# Patient Record
Sex: Female | Born: 1945 | Race: Black or African American | Hispanic: No | Smoking: Former smoker
Health system: Southern US, Community
[De-identification: ages and names within clinical notes are randomized; demographics above are authoritative.]

## PROBLEM LIST (undated history)

## (undated) DIAGNOSIS — Z789 Other specified health status: Secondary | ICD-10-CM

## (undated) DIAGNOSIS — D649 Anemia, unspecified: Secondary | ICD-10-CM

## (undated) DIAGNOSIS — B1920 Unspecified viral hepatitis C without hepatic coma: Secondary | ICD-10-CM

## (undated) DIAGNOSIS — B20 Human immunodeficiency virus [HIV] disease: Secondary | ICD-10-CM

## (undated) DIAGNOSIS — K4090 Unilateral inguinal hernia, without obstruction or gangrene, not specified as recurrent: Secondary | ICD-10-CM

## (undated) DIAGNOSIS — Z21 Asymptomatic human immunodeficiency virus [HIV] infection status: Secondary | ICD-10-CM

## (undated) DIAGNOSIS — K219 Gastro-esophageal reflux disease without esophagitis: Secondary | ICD-10-CM

## (undated) DIAGNOSIS — K648 Other hemorrhoids: Secondary | ICD-10-CM

## (undated) DIAGNOSIS — I10 Essential (primary) hypertension: Secondary | ICD-10-CM

## (undated) HISTORY — DX: Anemia, unspecified: D64.9

## (undated) HISTORY — DX: Asymptomatic human immunodeficiency virus (hiv) infection status: Z21

## (undated) HISTORY — DX: Essential (primary) hypertension: I10

## (undated) HISTORY — DX: Human immunodeficiency virus (HIV) disease: B20

---

## 2003-08-11 ENCOUNTER — Encounter: Payer: Self-pay | Admitting: Emergency Medicine

## 2003-08-11 ENCOUNTER — Emergency Department (HOSPITAL_COMMUNITY): Admission: EM | Admit: 2003-08-11 | Discharge: 2003-08-11 | Payer: Self-pay | Admitting: Emergency Medicine

## 2008-04-26 ENCOUNTER — Ambulatory Visit: Payer: Self-pay | Admitting: Internal Medicine

## 2008-04-27 ENCOUNTER — Encounter: Payer: Self-pay | Admitting: Internal Medicine

## 2008-04-27 ENCOUNTER — Ambulatory Visit (HOSPITAL_COMMUNITY): Admission: RE | Admit: 2008-04-27 | Discharge: 2008-04-27 | Payer: Self-pay | Admitting: Internal Medicine

## 2008-04-27 LAB — CONVERTED CEMR LAB
ALT: 34 units/L (ref 0–35)
AST: 32 units/L (ref 0–37)
Absolute CD4: 874 #/uL (ref 381–1469)
Albumin: 4 g/dL (ref 3.5–5.2)
Alkaline Phosphatase: 43 units/L (ref 39–117)
BUN: 12 mg/dL (ref 6–23)
Basophils Absolute: 0 10*3/uL (ref 0.0–0.1)
Basophils Relative: 0 % (ref 0–1)
CD4 T Helper %: 47 % (ref 32–62)
CO2: 17 meq/L — ABNORMAL LOW (ref 19–32)
Calcium: 9.3 mg/dL (ref 8.4–10.5)
Chloride: 109 meq/L (ref 96–112)
Creatinine, Ser: 0.91 mg/dL (ref 0.40–1.20)
Eosinophils Absolute: 0 10*3/uL (ref 0.0–0.7)
Eosinophils Relative: 1 % (ref 0–5)
Glucose, Bld: 95 mg/dL (ref 70–99)
HCT: 37.6 % (ref 36.0–46.0)
HCV Ab: POSITIVE — AB
HIV 1 RNA Quant: <50 copies/mL (ref <50)
HIV-1 RNA Quant, Log: <1.7 (ref <1.70)
HIV-1 antibody: POSITIVE — AB
HIV-2 Ab: UNDETERMINED — AB
HIV: REACTIVE
Hemoglobin: 12.1 g/dL (ref 12.0–15.0)
Hep B S Ab: NEGATIVE
Hepatitis B Surface Ag: NEGATIVE
Lymphocytes Relative: 62 % — ABNORMAL HIGH (ref 12–46)
Lymphs Abs: 1.8 10*3/uL (ref 0.7–4.0)
MCHC: 32.2 g/dL (ref 30.0–36.0)
MCV: 88.3 fL (ref 78.0–100.0)
Monocytes Absolute: 0.3 10*3/uL (ref 0.1–1.0)
Monocytes Relative: 10 % (ref 3–12)
Neutro Abs: 0.8 10*3/uL — ABNORMAL LOW (ref 1.7–7.7)
Neutrophils Relative %: 27 % — ABNORMAL LOW (ref 43–77)
Platelets: 132 10*3/uL — ABNORMAL LOW (ref 150–400)
Potassium: 4 meq/L (ref 3.5–5.3)
RBC: 4.26 M/uL (ref 3.87–5.11)
RDW: 13.4 % (ref 11.5–15.5)
Sodium: 141 meq/L (ref 135–145)
Total Bilirubin: 0.7 mg/dL (ref 0.3–1.2)
Total Lymphocytes %: 62 % — ABNORMAL HIGH (ref 12–46)
Total Protein: 7.8 g/dL (ref 6.0–8.3)
Total lymphocyte count: 1860 cells/mcL (ref 700–3300)
WBC, lymph enumeration: 3 10*3/uL — ABNORMAL LOW (ref 4.0–10.5)
WBC: 3 10*3/uL — ABNORMAL LOW (ref 4.0–10.5)

## 2008-05-10 ENCOUNTER — Ambulatory Visit: Payer: Self-pay | Admitting: Internal Medicine

## 2008-05-14 ENCOUNTER — Encounter: Admission: RE | Admit: 2008-05-14 | Discharge: 2008-05-14 | Payer: Self-pay | Admitting: Family Medicine

## 2008-06-09 ENCOUNTER — Ambulatory Visit: Payer: Self-pay | Admitting: Internal Medicine

## 2008-07-10 ENCOUNTER — Encounter: Payer: Self-pay | Admitting: Internal Medicine

## 2008-07-10 LAB — CONVERTED CEMR LAB: Pap Smear: NEGATIVE

## 2008-07-16 ENCOUNTER — Encounter (INDEPENDENT_AMBULATORY_CARE_PROVIDER_SITE_OTHER): Payer: Self-pay | Admitting: Radiology

## 2008-07-16 ENCOUNTER — Encounter: Admission: RE | Admit: 2008-07-16 | Discharge: 2008-07-16 | Payer: Self-pay | Admitting: Internal Medicine

## 2008-07-26 HISTORY — PX: BREAST BIOPSY: SHX20

## 2008-08-12 ENCOUNTER — Encounter: Payer: Self-pay | Admitting: Internal Medicine

## 2008-08-12 ENCOUNTER — Ambulatory Visit: Payer: Self-pay | Admitting: Internal Medicine

## 2008-08-12 LAB — CONVERTED CEMR LAB
ALT: 34 units/L (ref 0–35)
AST: 33 units/L (ref 0–37)
Absolute CD4: 998 #/uL (ref 381–1469)
Albumin: 3.9 g/dL (ref 3.5–5.2)
Alkaline Phosphatase: 51 units/L (ref 39–117)
BUN: 14 mg/dL (ref 6–23)
Basophils Absolute: 0 10*3/uL (ref 0.0–0.1)
Basophils Relative: 0 % (ref 0–1)
CD4 T Helper %: 47 % (ref 32–62)
CO2: 22 meq/L (ref 19–32)
Calcium: 9.4 mg/dL (ref 8.4–10.5)
Chloride: 106 meq/L (ref 96–112)
Cholesterol: 147 mg/dL (ref 0–200)
Creatinine, Ser: 0.86 mg/dL (ref 0.40–1.20)
Eosinophils Absolute: 0 10*3/uL (ref 0.0–0.7)
Eosinophils Relative: 1 % (ref 0–5)
Glucose, Bld: 99 mg/dL (ref 70–99)
HCT: 35.2 % — ABNORMAL LOW (ref 36.0–46.0)
HCV Quantitative: 8540000 intl units/mL — ABNORMAL HIGH (ref <43)
HDL: 47 mg/dL (ref >39)
HIV 1 RNA Quant: 110 copies/mL — ABNORMAL HIGH (ref <50)
HIV-1 RNA Quant, Log: 2.04 — ABNORMAL HIGH (ref <1.70)
Hemoglobin: 11.6 g/dL — ABNORMAL LOW (ref 12.0–15.0)
Hep A Total Ab: NEGATIVE
LDL Cholesterol: 80 mg/dL (ref 0–99)
Lymphocytes Relative: 59 % — ABNORMAL HIGH (ref 12–46)
Lymphs Abs: 2.2 10*3/uL (ref 0.7–4.0)
MCHC: 33 g/dL (ref 30.0–36.0)
MCV: 84.2 fL (ref 78.0–100.0)
Monocytes Absolute: 0.4 10*3/uL (ref 0.1–1.0)
Monocytes Relative: 11 % (ref 3–12)
Neutro Abs: 1 10*3/uL — ABNORMAL LOW (ref 1.7–7.7)
Neutrophils Relative %: 29 % — ABNORMAL LOW (ref 43–77)
Platelets: 133 10*3/uL — ABNORMAL LOW (ref 150–400)
Potassium: 3.8 meq/L (ref 3.5–5.3)
RBC: 4.18 M/uL (ref 3.87–5.11)
RDW: 13 % (ref 11.5–15.5)
Sodium: 136 meq/L (ref 135–145)
Total Bilirubin: 0.9 mg/dL (ref 0.3–1.2)
Total CHOL/HDL Ratio: 3.1
Total Lymphocytes %: 59 % — ABNORMAL HIGH (ref 12–46)
Total Protein: 7.8 g/dL (ref 6.0–8.3)
Total lymphocyte count: 2124 cells/mcL (ref 700–3300)
Triglycerides: 102 mg/dL (ref <150)
VLDL: 20 mg/dL (ref 0–40)
WBC, lymph enumeration: 3.6 10*3/uL — ABNORMAL LOW (ref 4.0–10.5)
WBC: 3.6 10*3/uL — ABNORMAL LOW (ref 4.0–10.5)

## 2008-08-26 ENCOUNTER — Ambulatory Visit: Payer: Self-pay | Admitting: Internal Medicine

## 2008-09-09 ENCOUNTER — Ambulatory Visit: Payer: Self-pay | Admitting: Internal Medicine

## 2008-10-11 ENCOUNTER — Ambulatory Visit: Payer: Self-pay | Admitting: Internal Medicine

## 2008-11-22 ENCOUNTER — Ambulatory Visit: Payer: Self-pay | Admitting: *Deleted

## 2009-02-10 ENCOUNTER — Encounter: Admission: RE | Admit: 2009-02-10 | Discharge: 2009-02-10 | Payer: Self-pay | Admitting: Family Medicine

## 2009-04-18 ENCOUNTER — Encounter: Payer: Self-pay | Admitting: Internal Medicine

## 2009-04-18 ENCOUNTER — Ambulatory Visit: Payer: Self-pay | Admitting: Internal Medicine

## 2009-04-18 DIAGNOSIS — K921 Melena: Secondary | ICD-10-CM | POA: Insufficient documentation

## 2009-04-18 DIAGNOSIS — Z8719 Personal history of other diseases of the digestive system: Secondary | ICD-10-CM | POA: Insufficient documentation

## 2009-04-18 DIAGNOSIS — N63 Unspecified lump in unspecified breast: Secondary | ICD-10-CM | POA: Insufficient documentation

## 2009-04-18 DIAGNOSIS — B182 Chronic viral hepatitis C: Secondary | ICD-10-CM | POA: Insufficient documentation

## 2009-04-18 DIAGNOSIS — B20 Human immunodeficiency virus [HIV] disease: Secondary | ICD-10-CM | POA: Insufficient documentation

## 2009-04-18 LAB — CONVERTED CEMR LAB
ALT: 35 units/L (ref 0–35)
AST: 36 units/L (ref 0–37)
Absolute CD4: 1038 #/uL (ref 381–1469)
Albumin: 3.9 g/dL (ref 3.5–5.2)
Alkaline Phosphatase: 54 units/L (ref 39–117)
BUN: 12 mg/dL (ref 6–23)
Basophils Absolute: 0 10*3/uL (ref 0.0–0.1)
Basophils Relative: 0 % (ref 0–1)
CD4 Count: 1038 microliters
CD4 T Helper %: 46 % (ref 32–62)
CO2: 21 meq/L (ref 19–32)
Calcium: 9.5 mg/dL (ref 8.4–10.5)
Chloride: 106 meq/L (ref 96–112)
Cholesterol: 140 mg/dL (ref 0–200)
Creatinine, Ser: 0.88 mg/dL (ref 0.40–1.20)
Eosinophils Absolute: 0 10*3/uL (ref 0.0–0.7)
Eosinophils Relative: 1 % (ref 0–5)
Glucose, Bld: 85 mg/dL (ref 70–99)
HCT: 37.2 % (ref 36.0–46.0)
HDL: 51 mg/dL (ref >39)
HIV 1 RNA Quant: <48 copies/mL (ref <48)
HIV-1 RNA Quant, Log: <1.68 (ref <1.68)
Hemoglobin: 12 g/dL (ref 12.0–15.0)
LDL Cholesterol: 61 mg/dL (ref 0–99)
Lymphocytes Relative: 61 % — ABNORMAL HIGH (ref 12–46)
Lymphs Abs: 2.2 10*3/uL (ref 0.7–4.0)
MCHC: 32.3 g/dL (ref 30.0–36.0)
MCV: 84.4 fL (ref 78.0–100.0)
Monocytes Absolute: 0.4 10*3/uL (ref 0.1–1.0)
Monocytes Relative: 11 % (ref 3–12)
Neutro Abs: 1 10*3/uL — ABNORMAL LOW (ref 1.7–7.7)
Neutrophils Relative %: 26 % — ABNORMAL LOW (ref 43–77)
Platelets: 118 10*3/uL — ABNORMAL LOW (ref 150–400)
Potassium: 4.1 meq/L (ref 3.5–5.3)
RBC: 4.41 M/uL (ref 3.87–5.11)
RDW: 12.8 % (ref 11.5–15.5)
Sodium: 139 meq/L (ref 135–145)
Total Bilirubin: 0.5 mg/dL (ref 0.3–1.2)
Total CHOL/HDL Ratio: 2.7
Total Lymphocytes %: 61 % — ABNORMAL HIGH (ref 12–46)
Total Protein: 7.8 g/dL (ref 6.0–8.3)
Total lymphocyte count: 2257 cells/mcL (ref 700–3300)
Triglycerides: 138 mg/dL (ref <150)
VLDL: 28 mg/dL (ref 0–40)
WBC, lymph enumeration: 3.7 10*3/uL — ABNORMAL LOW (ref 4.0–10.5)
WBC: 3.7 10*3/uL — ABNORMAL LOW (ref 4.0–10.5)

## 2009-04-20 ENCOUNTER — Ambulatory Visit (HOSPITAL_COMMUNITY): Admission: RE | Admit: 2009-04-20 | Discharge: 2009-04-20 | Payer: Self-pay | Admitting: Gastroenterology

## 2009-04-27 ENCOUNTER — Encounter: Payer: Self-pay | Admitting: Internal Medicine

## 2009-04-28 ENCOUNTER — Encounter: Admission: RE | Admit: 2009-04-28 | Discharge: 2009-04-28 | Payer: Self-pay | Admitting: Family Medicine

## 2009-05-03 ENCOUNTER — Encounter: Payer: Self-pay | Admitting: Internal Medicine

## 2009-05-03 ENCOUNTER — Ambulatory Visit: Payer: Self-pay | Admitting: Internal Medicine

## 2009-06-09 ENCOUNTER — Encounter: Payer: Self-pay | Admitting: Internal Medicine

## 2009-10-20 ENCOUNTER — Encounter: Payer: Self-pay | Admitting: Internal Medicine

## 2009-10-20 ENCOUNTER — Ambulatory Visit: Payer: Self-pay | Admitting: Internal Medicine

## 2009-10-20 DIAGNOSIS — Z91013 Allergy to seafood: Secondary | ICD-10-CM

## 2010-01-15 ENCOUNTER — Emergency Department (HOSPITAL_COMMUNITY): Admission: EM | Admit: 2010-01-15 | Discharge: 2010-01-16 | Payer: Self-pay | Admitting: Emergency Medicine

## 2010-05-02 ENCOUNTER — Ambulatory Visit (HOSPITAL_COMMUNITY): Admission: RE | Admit: 2010-05-02 | Discharge: 2010-05-02 | Payer: Self-pay | Admitting: Family Medicine

## 2010-05-02 ENCOUNTER — Encounter: Payer: Self-pay | Admitting: Internal Medicine

## 2010-06-02 ENCOUNTER — Telehealth: Payer: Self-pay | Admitting: Internal Medicine

## 2010-06-02 ENCOUNTER — Emergency Department (HOSPITAL_COMMUNITY): Admission: EM | Admit: 2010-06-02 | Discharge: 2010-06-02 | Payer: Self-pay | Admitting: Emergency Medicine

## 2010-06-05 ENCOUNTER — Ambulatory Visit: Payer: Self-pay | Admitting: Internal Medicine

## 2010-06-05 DIAGNOSIS — N390 Urinary tract infection, site not specified: Secondary | ICD-10-CM | POA: Insufficient documentation

## 2010-06-05 LAB — CONVERTED CEMR LAB
ALT: 34 units/L (ref 0–35)
AST: 40 units/L — ABNORMAL HIGH (ref 0–37)
Albumin: 3.7 g/dL (ref 3.5–5.2)
Alkaline Phosphatase: 46 units/L (ref 39–117)
BUN: 14 mg/dL (ref 6–23)
Basophils Absolute: 0 10*3/uL (ref 0.0–0.1)
Basophils Relative: 0 % (ref 0–1)
CO2: 18 meq/L — ABNORMAL LOW (ref 19–32)
Calcium: 9.5 mg/dL (ref 8.4–10.5)
Chloride: 106 meq/L (ref 96–112)
Cholesterol: 137 mg/dL (ref 0–200)
Creatinine, Ser: 1.04 mg/dL (ref 0.40–1.20)
Eosinophils Absolute: 0 10*3/uL (ref 0.0–0.7)
Eosinophils Relative: 1 % (ref 0–5)
Glucose, Bld: 120 mg/dL — ABNORMAL HIGH (ref 70–99)
HCT: 37.3 % (ref 36.0–46.0)
HDL: 32 mg/dL — ABNORMAL LOW (ref >39)
HIV 1 RNA Quant: 88 copies/mL — ABNORMAL HIGH (ref <48)
HIV-1 RNA Quant, Log: 1.94 — ABNORMAL HIGH (ref <1.68)
Hemoglobin: 11.9 g/dL — ABNORMAL LOW (ref 12.0–15.0)
LDL Cholesterol: 80 mg/dL (ref 0–99)
Lymphocytes Relative: 34 % (ref 12–46)
Lymphs Abs: 1.5 10*3/uL (ref 0.7–4.0)
MCHC: 31.9 g/dL (ref 30.0–36.0)
MCV: 84.8 fL (ref 78.0–100.0)
Monocytes Absolute: 0.7 10*3/uL (ref 0.1–1.0)
Monocytes Relative: 15 % — ABNORMAL HIGH (ref 3–12)
Neutro Abs: 2.3 10*3/uL (ref 1.7–7.7)
Neutrophils Relative %: 51 % (ref 43–77)
Platelets: 184 10*3/uL (ref 150–400)
Potassium: 4.2 meq/L (ref 3.5–5.3)
RBC: 4.4 M/uL (ref 3.87–5.11)
RDW: 12.8 % (ref 11.5–15.5)
Sodium: 137 meq/L (ref 135–145)
Total Bilirubin: 0.5 mg/dL (ref 0.3–1.2)
Total CHOL/HDL Ratio: 4.3
Total Protein: 7.8 g/dL (ref 6.0–8.3)
Triglycerides: 127 mg/dL (ref <150)
VLDL: 25 mg/dL (ref 0–40)
WBC: 4.5 10*3/uL (ref 4.0–10.5)

## 2010-06-21 ENCOUNTER — Encounter (INDEPENDENT_AMBULATORY_CARE_PROVIDER_SITE_OTHER): Payer: Self-pay | Admitting: *Deleted

## 2010-07-05 ENCOUNTER — Encounter: Payer: Self-pay | Admitting: Internal Medicine

## 2010-09-22 ENCOUNTER — Ambulatory Visit: Payer: Self-pay | Admitting: Internal Medicine

## 2010-09-22 ENCOUNTER — Ambulatory Visit (HOSPITAL_COMMUNITY): Admission: RE | Admit: 2010-09-22 | Discharge: 2010-09-22 | Payer: Self-pay | Admitting: Internal Medicine

## 2010-09-22 DIAGNOSIS — R109 Unspecified abdominal pain: Secondary | ICD-10-CM | POA: Insufficient documentation

## 2010-09-22 LAB — CONVERTED CEMR LAB
ALT: 28 units/L (ref 0–35)
AST: 35 units/L (ref 0–37)
Albumin: 4 g/dL (ref 3.5–5.2)
Alkaline Phosphatase: 53 units/L (ref 39–117)
BUN: 14 mg/dL (ref 6–23)
Basophils Absolute: 0 10*3/uL (ref 0.0–0.1)
Basophils Relative: 1 % (ref 0–1)
CO2: 18 meq/L — ABNORMAL LOW (ref 19–32)
Calcium: 9.7 mg/dL (ref 8.4–10.5)
Chloride: 106 meq/L (ref 96–112)
Creatinine, Ser: 1.02 mg/dL (ref 0.40–1.20)
Eosinophils Absolute: 0 10*3/uL (ref 0.0–0.7)
Eosinophils Relative: 1 % (ref 0–5)
Glucose, Bld: 102 mg/dL — ABNORMAL HIGH (ref 70–99)
HCT: 37.7 % (ref 36.0–46.0)
HIV 1 RNA Quant: 227 copies/mL — ABNORMAL HIGH (ref <20)
HIV-1 RNA Quant, Log: 2.36 — ABNORMAL HIGH (ref <1.30)
Hemoglobin: 12.4 g/dL (ref 12.0–15.0)
Lymphocytes Relative: 58 % — ABNORMAL HIGH (ref 12–46)
Lymphs Abs: 2 10*3/uL (ref 0.7–4.0)
MCHC: 32.9 g/dL (ref 30.0–36.0)
MCV: 83.6 fL (ref 78.0–100.0)
Monocytes Absolute: 0.4 10*3/uL (ref 0.1–1.0)
Monocytes Relative: 11 % (ref 3–12)
Neutro Abs: 1 10*3/uL — ABNORMAL LOW (ref 1.7–7.7)
Neutrophils Relative %: 29 % — ABNORMAL LOW (ref 43–77)
Pap Smear: NEGATIVE
Platelets: 132 10*3/uL — ABNORMAL LOW (ref 150–400)
Potassium: 4.3 meq/L (ref 3.5–5.3)
RBC: 4.51 M/uL (ref 3.87–5.11)
RDW: 13.4 % (ref 11.5–15.5)
Sodium: 138 meq/L (ref 135–145)
Total Bilirubin: 0.6 mg/dL (ref 0.3–1.2)
Total Protein: 7.8 g/dL (ref 6.0–8.3)
WBC: 3.5 10*3/uL — ABNORMAL LOW (ref 4.0–10.5)

## 2010-09-28 ENCOUNTER — Encounter: Payer: Self-pay | Admitting: Internal Medicine

## 2010-10-09 ENCOUNTER — Ambulatory Visit: Payer: Self-pay | Admitting: Internal Medicine

## 2011-01-09 NOTE — Miscellaneous (Signed)
Summary: RW Update  Clinical Lists Changes  Observations: Added new observation of DATE1STVISIT: 06/05/2010 (07/05/2010 13:52) Added new observation of RWPARTICIP: Yes (07/05/2010 13:52)

## 2011-01-09 NOTE — Assessment & Plan Note (Signed)
Summary: HAVING CHILLS/VS   CC:  ED follow up and pt. had UTI.  History of Present Illness: Pt had chills, fever and a HA last week.  She went to the ED and was diagnosed with a UTI.  She was given bactrim. She feels a little better but continues to feel fatigued.  No fever or chills.  Preventive Screening-Counseling & Management  Alcohol-Tobacco     Alcohol drinks/day: 0     Smoking Status: never  Caffeine-Diet-Exercise     Caffeine use/day: tea     Does Patient Exercise: yes     Type of exercise: walks,strething     Exercise (avg: min/session): 30-60     Times/week: 3  Safety-Violence-Falls     Seat Belt Use: yes      Sexual History:  n/a.    Comments: pt. declined condoms   Updated Prior Medication List: CIPROFLOXACIN HCL 500 MG TABS (CIPROFLOXACIN HCL) Take 1 tablet by mouth two times a day  Current Allergies (reviewed today): * SHELLFISH Past History:  Past Medical History: Last updated: 04/18/2009 Hepatitis C HIV disease  Social History: Sexual History:  n/a  Review of Systems       The patient complains of fever and headaches.  The patient denies anorexia and abdominal pain.    Vital Signs:  Patient profile:   65 year old female Menstrual status:  postmenopausal Height:      62 inches (157.48 cm) Weight:      124.8 pounds (56.73 kg) BMI:     22.91 Temp:     98.0 degrees F (36.67 degrees C) oral Pulse rate:   66 / minute BP sitting:   131 / 72  (right arm)  Vitals Entered By: Wendall Mola CMA Duncan Dull) (June 05, 2010 9:51 AM) CC: ED follow up, pt. had UTI Is Patient Diabetic? No Pain Assessment Patient in pain? no      Nutritional Status BMI of 19 -24 = normal Nutritional Status Detail appetite "not good"  Have you ever been in a relationship where you felt threatened, hurt or afraid?No   Does patient need assistance? Functional Status Self care Ambulation Normal   Physical Exam  General:  alert, well-developed, well-nourished,  and well-hydrated.   Head:  normocephalic and atraumatic.   Mouth:  pharynx pink and moist.   Lungs:  normal breath sounds.      Impression & Recommendations:  Problem # 1:  HIV DISEASE (ICD-042)  Pt currently asymptomatic and not on therapy.  will obtain labs today. F/u in 6 months. Diagnostics Reviewed:  HIV: HIV positive - not AIDS (04/18/2009)   HIV-Western blot: Positive (04/27/2008)   CD4: 1038 (04/18/2009)   WBC: 3.7 (04/18/2009)   Hgb: 12.0 (04/18/2009)   HCT: 37.2 (04/18/2009)   Platelets: 118 (04/18/2009) HIV-1 RNA: <48 copies/mL (04/18/2009)   HBSAg: NEG (04/27/2008)  Orders: T- * Misc. Laboratory test [id number])  Problem # 2:  UTI (ICD-599.0) Urine culture came back e. coli Resistant to Bactrim. will switch to ciprofloxacin. The following medications were removed from the medication list:    Bactrim Ds 800-160 Mg Tabs (Sulfamethoxazole-trimethoprim) .Marland Kitchen... Take one tablet twice a day x 7 days Her updated medication list for this problem includes:    Ciprofloxacin Hcl 500 Mg Tabs (Ciprofloxacin hcl) .Marland Kitchen... Take 1 tablet by mouth two times a day  Medications Added to Medication List This Visit: 1)  Bactrim Ds 800-160 Mg Tabs (Sulfamethoxazole-trimethoprim) .... Take one tablet twice a day x 7 days 2)  Ciprofloxacin Hcl 500 Mg Tabs (Ciprofloxacin hcl) .... Take 1 tablet by mouth two times a day  Other Orders: T-CD4SP ([facility]) (CD4SP) T-HIV Viral Load [id number]) T-Comprehensive Metabolic Panel [phone]) T-CBC w/Diff [phone]) T-RPR (Syphilis) [id number]) T-Lipid Profile ([id number]) Est. Patient Level III (42706)  Patient Instructions: 1)  Please schedule a follow-up appointment in 6 months.  Prescriptions: CIPROFLOXACIN HCL 500 MG TABS (CIPROFLOXACIN HCL) Take 1 tablet by mouth two times a day  #14 x 0   Entered and Authorized by:   Yisroel Ramming MD   Signed by:   Yisroel Ramming MD on 06/05/2010   Method used:   Print then Give to Patient   RxID:    [id number]

## 2011-01-09 NOTE — Letter (Signed)
Summary: Results Follow-up Letter  [facility]  [address]   [place], [place] [address]   Phone: [phone]  Fax: [phone]          September 28, 2010  [address] [place], [place]  [address]  Dear Ms. Salahuddin,   The following are the results of your recent test(s):  Test     Result     Pap Smear    Normal_XXX___  Not Normal_____       Comments:  Everything was normal.  I will see you in one year for your next PAP smear.  Thank you for coming to the Center for your care.  Sincerely,    Jennet Maduro [facility]

## 2011-01-09 NOTE — Assessment & Plan Note (Signed)
Summary: followup on labs/jc   CC:  follow-up visit and lab results.  History of Present Illness: Pt here for f/u on labs. Abdominal pain has improved.  Preventive Screening-Counseling & Management  Alcohol-Tobacco     Alcohol drinks/day: 0     Smoking Status: never  Caffeine-Diet-Exercise     Caffeine use/day: tea     Does Patient Exercise: yes     Type of exercise: walking     Exercise (avg: min/session): 30-60     Times/week: 3  Hep-HIV-STD-Contraception     HIV Risk: risk noted  Safety-Violence-Falls     Seat Belt Use: yes      Sexual History:  n/a.        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     Current Allergies (reviewed today): * SHELLFISH Past History:  Past Medical History: Last updated: 04/18/2009 Hepatitis C HIV disease  Review of Systems  The patient denies anorexia, fever, and weight loss.    Vital Signs:  Patient profile:   65 year old female Menstrual status:  postmenopausal Height:      62 inches (157.48 cm) Weight:      128.0 pounds (58.18 kg) BMI:     23.50 Temp:     98.4 degrees F (36.89 degrees C) oral Pulse rate:   54 / minute BP sitting:   142 / 75  (left arm)  Vitals Entered By: Wendall Mola CMA Duncan Dull) (October 09, 2010 2:32 PM) CC: follow-up visit, lab results Is Patient Diabetic? No Pain Assessment Patient in pain? no      Nutritional Status BMI of 19 -24 = normal Nutritional Status Detail appetite "good"  Have you ever been in a relationship where you felt threatened, hurt or afraid?No   Does patient need assistance? Functional Status Self care Ambulation Normal   Physical Exam  General:  alert, well-developed, well-nourished, and well-hydrated.   Head:  normocephalic and atraumatic.   Mouth:  pharynx pink and moist.   Lungs:  normal breath sounds.     Impression & Recommendations:  Problem # 1:  HIV DISEASE (ICD-042) Pt currently asymptomatic and not on therapy.  She will return in 6 months for  repeat labs. Diagnostics Reviewed:  HIV: HIV positive - not AIDS (04/18/2009)   HIV-Western blot: Positive (04/27/2008)   CD4: 490 (06/06/2010)   WBC: 3.5 (09/22/2010)   Hgb: 12.4 (09/22/2010)   HCT: 37.7 (09/22/2010)   Platelets: 132 (09/22/2010) HIV-1 RNA: 227 (09/22/2010)   HBSAg: NEG (04/27/2008)  Other Orders: Est. Patient Level III (59563) Future Orders: T-CD4SP ([facility]) (CD4SP) ... 04/07/2011 T-HIV Viral Load [id number]) ... 04/07/2011 T-Comprehensive Metabolic Panel [id number]) ... 04/07/2011 T-CBC w/Diff ([id number]) ... 04/07/2011  Patient Instructions: 1)  Please schedule a follow-up appointment in 6 months, 2 weeks after labs.

## 2011-01-09 NOTE — Assessment & Plan Note (Signed)
Summary: ABD CRAMPING, PAP smear   CC:  pt. c/o abdominal cramping and bloating, frequent urination, and constipation x 2 months.  History of Present Illness: Pt c/o bloating and crampy abdominal pain for the last couple of days.  She suffers from constipation but took some MOM and had a bowel movement yesterday.  She has also had some flatulence. No nausea, vomiting or fever. Pt declines PPD because she had a positive one in the past.  Preventive Screening-Counseling & Management  Alcohol-Tobacco     Alcohol drinks/day: 0     Smoking Status: never  Caffeine-Diet-Exercise     Caffeine use/day: tea     Does Patient Exercise: yes     Type of exercise: walking     Exercise (avg: min/session): 30-60     Times/week: 3  Hep-HIV-STD-Contraception     HIV Risk: risk noted  Safety-Violence-Falls     Seat Belt Use: yes      Sexual History:  n/a.        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    Comments: pt. declined condoms   Current Allergies (reviewed today): * SHELLFISH Past History:  Past Medical History: Last updated: 04/18/2009 Hepatitis C HIV disease  Social History: Drug Use:  former, past IV drug use  Review of Systems  The patient denies anorexia, fever, weight loss, melena, hematochezia, and severe indigestion/heartburn.    Vital Signs:  Patient profile:   65 year old female Menstrual status:  postmenopausal Height:      62 inches (157.48 cm) Weight:      124.12 pounds (56.42 kg) BMI:     22.78 Temp:     98.5 degrees F (36.94 degrees C) oral Pulse rate:   60 / minute BP sitting:   129 / 86  (right arm)  Vitals Entered By: Wendall Mola CMA Duncan Dull) (September 22, 2010 9:51 AM) CC: pt. c/o abdominal cramping and bloating, frequent urination, constipation x 2 months Is Patient Diabetic? No Pain Assessment Patient in pain? no      Nutritional Status BMI of 19 -24 = normal Nutritional Status Detail appetite "good"  Does patient need  assistance? Functional Status Self care Ambulation Normal   Physical Exam  General:  alert, well-developed, well-nourished, and well-hydrated.   Head:  normocephalic and atraumatic.   Mouth:  pharynx pink and moist.   Abdomen:  soft, non-tender, no masses, no hepatomegaly, and no splenomegaly.     Impression & Recommendations:  Problem # 1:  ABDOMINAL PAIN, UNSPECIFIED SITE (ICD-789.00) obtain a KUB PAP Orders: Est. Patient Level IV (98119) Diagnostic X-Ray/Fluoroscopy (Diagnostic X-Ray/Flu)  Problem # 2:  HIV DISEASE (ICD-042) Pt curently asymptomatic and not on therapy.  Will obtain labs and have her return in 2weeks.  Influenza vaccine given. Orders: Est. Patient Level IV (14782) T-CBC w/Diff [id number]) T-CD4SP [facility]) (CD4SP) T-Comprehensive Metabolic Panel [id number]) T-HIV Viral Load [phone])  Diagnostics Reviewed:  HIV: HIV positive - not AIDS (04/18/2009)   HIV-Western blot: Positive (04/27/2008)   CD4: 490 (06/06/2010)   WBC: 4.5 (06/05/2010)   Hgb: 11.9 (06/05/2010)   HCT: 37.3 (06/05/2010)   Platelets: 184 (06/05/2010) HIV-1 RNA: 88 (06/05/2010)   HBSAg: NEG (04/27/2008)  Other Orders: T-PAP [facility]) [phone]) Influenza Vaccine NON MCR (66440) Hepatitis A Vaccine (Adult Dose) (34742) Admin 1st Vaccine (59563)  Patient Instructions: 1)  Please schedule a follow-up appointment in 2 weeks.    Process Orders Check Orders Results:     [facility]: ABN not required for this insurance Tests Sent for requisitioning (September 22, 2010 2:51 PM):     09/22/2010: [facility] -- T-CBC w/Diff [[id number]] (signed)     09/22/2010: [facility] -- T-Comprehensive Metabolic Panel [[id number]] (signed)     09/22/2010: [facility] -- T-HIV Viral Load [phone] (signed)  Pt. given educational materials re:  BSE, Women and HIV and a journal to keep up with lab #s and medications. Jennet Maduro RN  September 22, 2010 11:01 AM   Immunizations Administered:  Influenza Vaccine # 1:    Vaccine Type: Fluvax Non-MCR    Site: right deltoid    Mfr: Novartis    Dose: 0.5 ml    Route: IM    Given by: Wendall Mola CMA ( AAMA)    Exp. Date: 03/11/2011    Lot #: [id number]    VIS given: 07/04/10 version given September 22, 2010.  Hepatitis A Vaccine # 1:    Vaccine Type: HepA    Site: left deltoid    Mfr: GlaxoSmithKline    Dose: 0.5 ml    Route: IM    Given by: Wendall Mola CMA ( AAMA)    Exp. Date: 11/22/2012    Lot #: [id number]    VIS given: 02/27/05 version given September 22, 2010.  Flu Vaccine Consent Questions:    Do you have a history of severe allergic reactions to this vaccine? no    Any prior history of allergic reactions to egg and/or gelatin? no    Do you have a sensitivity to the preservative Thimersol? no    Do you have a past history of Guillan-Barre Syndrome? no    Do you currently have an acute febrile illness? no    Have you ever had a severe reaction to latex? no    Vaccine information given and explained to patient? yes    Are you currently pregnant? no

## 2011-01-09 NOTE — Miscellaneous (Signed)
Summary: clinical update/ryan white  Clinical Lists Changes  Observations: Added new observation of AIDSDAP: No (06/21/2010 12:34) Added new observation of PCTFPL: 104.60  (06/21/2010 12:34) Added new observation of INCOMESOURCE: SSI  (06/21/2010 12:34) Added new observation of HOUSEINCOME: [address]  (06/21/2010 12:34) Added new observation of #CHILD<18 IN: No  (06/21/2010 12:34) Added new observation of FAMILYSIZE: 1  (06/21/2010 12:34) Added new observation of HOUSING: Stable/permanent  (06/21/2010 12:34) Added new observation of FINASSESSDT: 06/21/2010  (06/21/2010 12:34) Added new observation of MARITAL STAT: Widowed  (06/21/2010 12:34) Added new observation of REC_MESSAGE: Yes  (06/21/2010 12:34) Added new observation of RECPHONECALL: Yes  (06/21/2010 12:34) Added new observation of REC_MAIL: Yes  (06/21/2010 12:34)

## 2011-01-09 NOTE — Miscellaneous (Signed)
Summary: HIPAA Restrictions  HIPAA Restrictions   Imported By: Florinda Marker 06/07/2010 14:43:01  _____________________________________________________________________  External Attachment:    Type:   Image     Comment:   External Document

## 2011-01-09 NOTE — Progress Notes (Signed)
Summary: pt. went to ED  Phone Note Outgoing Call   Call placed by: Annice Pih Call placed to: Patient Details for Reason: remind pt. of appt. Summary of Call: Called to remind pt. of Mondays appt. and she said she was on her way to ED at [facility], because she was still having chills and not feeling well, but will still keep Mondays appt. Initial call taken by: Wendall Mola CMA Duncan Dull),  June 02, 2010 9:11 AM

## 2011-01-26 ENCOUNTER — Encounter (INDEPENDENT_AMBULATORY_CARE_PROVIDER_SITE_OTHER): Payer: Self-pay | Admitting: *Deleted

## 2011-01-31 ENCOUNTER — Encounter (INDEPENDENT_AMBULATORY_CARE_PROVIDER_SITE_OTHER): Payer: Self-pay | Admitting: *Deleted

## 2011-01-31 NOTE — Miscellaneous (Signed)
  Clinical Lists Changes  Observations: Added new observation of LATINO/HISP: No (01/26/2011 15:21)

## 2011-02-06 NOTE — Miscellaneous (Signed)
  Clinical Lists Changes  Observations: Added new observation of HIV RISK BEH: Heterosexual contact (01/31/2011 17:03)

## 2011-02-21 LAB — T-HELPER CELL (CD4) - (RCID CLINIC ONLY)
CD4 % Helper T Cell: 39 % (ref 33–55)
CD4 T Cell Abs: 720 uL (ref 400–2700)

## 2011-02-25 LAB — T-HELPER CELL (CD4) - (RCID CLINIC ONLY)
CD4 % Helper T Cell: 38 % (ref 33–55)
CD4 T Cell Abs: 490 uL (ref 400–2700)

## 2011-02-25 LAB — URINALYSIS, ROUTINE W REFLEX MICROSCOPIC
Bilirubin Urine: NEGATIVE
Glucose, UA: NEGATIVE mg/dL
Ketones, ur: NEGATIVE mg/dL
Nitrite: NEGATIVE
Protein, ur: 100 mg/dL — AB
Specific Gravity, Urine: 1.012 (ref 1.005–1.030)
Urobilinogen, UA: 1 mg/dL (ref 0.0–1.0)
pH: 6.5 (ref 5.0–8.0)

## 2011-02-25 LAB — URINE CULTURE: Colony Count: >=100000

## 2011-02-25 LAB — URINE MICROSCOPIC-ADD ON

## 2011-02-28 LAB — URINALYSIS, ROUTINE W REFLEX MICROSCOPIC
Bilirubin Urine: NEGATIVE
Glucose, UA: NEGATIVE mg/dL
Hgb urine dipstick: NEGATIVE
Ketones, ur: NEGATIVE mg/dL
Nitrite: NEGATIVE
Protein, ur: NEGATIVE mg/dL
Specific Gravity, Urine: 1.009 (ref 1.005–1.030)
Urobilinogen, UA: 0.2 mg/dL (ref 0.0–1.0)
pH: 7.5 (ref 5.0–8.0)

## 2011-02-28 LAB — POCT CARDIAC MARKERS
CKMB, poc: 1.2 ng/mL (ref 1.0–8.0)
Myoglobin, poc: 88.2 ng/mL (ref 12–200)
Troponin i, poc: <0.05 ng/mL (ref 0.00–0.09)

## 2011-02-28 LAB — POCT I-STAT, CHEM 8
BUN: 15 mg/dL (ref 6–23)
Chloride: 107 mEq/L (ref 96–112)
Potassium: 4 mEq/L (ref 3.5–5.1)
Sodium: 140 mEq/L (ref 135–145)
TCO2: 27 mmol/L (ref 0–100)

## 2011-02-28 LAB — HEPATIC FUNCTION PANEL
Alkaline Phosphatase: 46 U/L (ref 39–117)
Indirect Bilirubin: 0.8 mg/dL (ref 0.3–0.9)
Total Protein: 7.9 g/dL (ref 6.0–8.3)

## 2011-02-28 LAB — URINE MICROSCOPIC-ADD ON

## 2011-02-28 LAB — DIFFERENTIAL
Basophils Absolute: 0 10*3/uL (ref 0.0–0.1)
Lymphocytes Relative: 60 % — ABNORMAL HIGH (ref 12–46)
Lymphs Abs: 3.1 10*3/uL (ref 0.7–4.0)
Neutro Abs: 1.4 10*3/uL — ABNORMAL LOW (ref 1.7–7.7)
Neutrophils Relative %: 28 % — ABNORMAL LOW (ref 43–77)

## 2011-02-28 LAB — CBC
Platelets: 129 10*3/uL — ABNORMAL LOW (ref 150–400)
RDW: 12.3 % (ref 11.5–15.5)
WBC: 5.1 10*3/uL (ref 4.0–10.5)

## 2011-04-03 ENCOUNTER — Other Ambulatory Visit: Payer: Self-pay | Admitting: Internal Medicine

## 2011-04-03 DIAGNOSIS — Z1231 Encounter for screening mammogram for malignant neoplasm of breast: Secondary | ICD-10-CM

## 2011-04-18 ENCOUNTER — Other Ambulatory Visit (INDEPENDENT_AMBULATORY_CARE_PROVIDER_SITE_OTHER): Payer: Self-pay

## 2011-04-18 DIAGNOSIS — B20 Human immunodeficiency virus [HIV] disease: Secondary | ICD-10-CM

## 2011-04-19 LAB — CBC WITH DIFFERENTIAL/PLATELET
Eosinophils Relative: 1 % (ref 0–5)
HCT: 37.4 % (ref 36.0–46.0)
Lymphocytes Relative: 59 % — ABNORMAL HIGH (ref 12–46)
Lymphs Abs: 1.8 10*3/uL (ref 0.7–4.0)
MCV: 83.3 fL (ref 78.0–100.0)
Monocytes Absolute: 0.3 10*3/uL (ref 0.1–1.0)
RBC: 4.49 MIL/uL (ref 3.87–5.11)
RDW: 13.8 % (ref 11.5–15.5)
WBC: 3.1 10*3/uL — ABNORMAL LOW (ref 4.0–10.5)

## 2011-04-19 LAB — COMPLETE METABOLIC PANEL WITH GFR
AST: 34 U/L (ref 0–37)
Albumin: 4.3 g/dL (ref 3.5–5.2)
Alkaline Phosphatase: 59 U/L (ref 39–117)
BUN: 14 mg/dL (ref 6–23)
Potassium: 4.4 mEq/L (ref 3.5–5.3)

## 2011-04-19 LAB — HIV-1 RNA QUANT-NO REFLEX-BLD
HIV 1 RNA Quant: 61 copies/mL — ABNORMAL HIGH (ref <20)
HIV-1 RNA Quant, Log: 1.79 {Log} — ABNORMAL HIGH (ref <1.30)

## 2011-04-23 ENCOUNTER — Ambulatory Visit: Payer: Self-pay | Admitting: Infectious Diseases

## 2011-04-24 NOTE — Op Note (Signed)
NAMELUDIVINA, GUYMON                ACCOUNT NO.:  [id number]   MEDICAL RECORD NO.:  [id number]          PATIENT TYPE:  AMB   LOCATION:  ENDO                         FACILITY:  [facility]   PHYSICIAN:  Ulyess Mort, MD  DATE OF BIRTH:  07-17-1946   DATE OF PROCEDURE:  DATE OF DISCHARGE:                               OPERATIVE REPORT   PROCEDURE:  Colonoscopic examination.   This is a very nice 65 year old lady who has been having some  intermittent rectal bleeding, predominantly with wiping.  No blood seen  in the vault.  She has noticed this over the past several months and was  referred for colonoscopic examination.  Unfortunately, the anesthesia  was unable to adequately be obtained in an IV line; therefore, it was  felt that we would do a flexible sigmoidoscopic examination with passage  of the endoscope as far as she could tolerate the procedure.  We used a  pediatric colonoscope.   PHYSICAL EXAMINATION:  Unremarkable.   ANESTHESIA:  Patient was not premedicated.   PROCEDURE:  The rectal examination was unremarkable.  Passage of the  pediatric colonoscope was carried out to approximately the hepatic  flexure.  She was appropriately well cleaned out with a wonderful prep.  She was amazingly tolerant of the procedure, but attempting to pass the  endoscope beyond the hepatic flexure, which is always somewhat  difficult.  It was too uncomfortable for her, so we decided to  discontinue the procedure at this point.  On retraction of the  endoscope, there were no mucosal lesions except for a few small  hyperplastic polyps in the sigmoid colon were noted.  These were just 1  to 2 mm in size, and certainly it appeared to be of no consequence.  We  did invert the endoscope in the rectal vault, and we noted 1 to 2+  external hemorrhoids only.  The endoscope was then turned back into the  normal position and retracted.  Patient tolerated the procedure quite  nicely, especially for not  having any sedation.   PROCEDURE:  The Olympus pediatric colonoscope was passed to the distal  hepatic flexure without difficulty.  It was essentially normal except  for noting the small external hemorrhoids described previously, and I  think that she should have stools for occult blood obtained in  approximately 3 to 6 months, and if she has any evidence of anemia or  any evidence of further bleeding, I would consider doing a CT  colography.      Ulyess Mort, MD  Electronically Signed     SML/MEDQ  D:  04/20/2009  T:  04/20/2009  Job:  [id number]   cc:   Maurice March, M.D.  Fax: [phone]

## 2011-05-02 ENCOUNTER — Ambulatory Visit (INDEPENDENT_AMBULATORY_CARE_PROVIDER_SITE_OTHER): Payer: Self-pay | Admitting: Infectious Diseases

## 2011-05-02 ENCOUNTER — Encounter: Payer: Self-pay | Admitting: Infectious Diseases

## 2011-05-02 DIAGNOSIS — Z113 Encounter for screening for infections with a predominantly sexual mode of transmission: Secondary | ICD-10-CM

## 2011-05-02 DIAGNOSIS — B20 Human immunodeficiency virus [HIV] disease: Secondary | ICD-10-CM

## 2011-05-02 DIAGNOSIS — Z23 Encounter for immunization: Secondary | ICD-10-CM

## 2011-05-02 DIAGNOSIS — Z79899 Other long term (current) drug therapy: Secondary | ICD-10-CM

## 2011-05-02 DIAGNOSIS — B1710 Acute hepatitis C without hepatic coma: Secondary | ICD-10-CM

## 2011-05-02 NOTE — Progress Notes (Signed)
  Subjective:    Patient ID: Tina Olson, female    DOB: 19-Oct-1946, 65 y.o.   MRN: [id number]  HPI 65 yo F with HIV+ 1994 (had anorexia and wt loss) , last CD4 780 and VL 61 (04-18-11). She is on no medications. She had normal PAP 09-2010. BP runs high sometimes, has to watch her salt. No headaches or chest pain.    Review of Systems  Constitutional: Negative for appetite change and unexpected weight change.  Respiratory: Negative for chest tightness.   Gastrointestinal: Negative for diarrhea and constipation.  Genitourinary: Negative for difficulty urinating.  Neurological: Negative for headaches.       Objective:   Physical Exam  Constitutional: She appears well-developed and well-nourished.  Eyes: EOM are normal. Pupils are equal, round, and reactive to light.  Neck: Neck supple.  Cardiovascular: Normal rate, regular rhythm and normal heart sounds.   Pulmonary/Chest: Effort normal and breath sounds normal. No respiratory distress.  Abdominal: Soft. Bowel sounds are normal. She exhibits no distension.          Assessment & Plan:

## 2011-05-02 NOTE — Assessment & Plan Note (Signed)
She is doing very well. Will cont to watch her off meds. She is not sexually active. Will check her lipids and RPR at next visit. She will need repeat PAP in October 2012.

## 2011-05-02 NOTE — Progress Notes (Signed)
Addended by: Wendall Mola on: 05/02/2011 12:32 PM   Modules accepted: Orders

## 2011-05-02 NOTE — Assessment & Plan Note (Signed)
Will continue to follow her LFTs. She gets 2nd hepA today.

## 2011-06-21 ENCOUNTER — Ambulatory Visit: Payer: Self-pay

## 2011-07-04 ENCOUNTER — Ambulatory Visit: Payer: Self-pay

## 2011-07-31 ENCOUNTER — Telehealth: Payer: Self-pay | Admitting: *Deleted

## 2011-07-31 NOTE — Telephone Encounter (Signed)
C/o cough & cold s/s x 8 days. Has tried otc w/o relief. To front desk to make appt either today or tomorrow. Pt agreed with plan

## 2011-08-02 ENCOUNTER — Encounter: Payer: Self-pay | Admitting: Adult Health

## 2011-08-02 ENCOUNTER — Ambulatory Visit (INDEPENDENT_AMBULATORY_CARE_PROVIDER_SITE_OTHER): Payer: Self-pay | Admitting: Adult Health

## 2011-08-02 DIAGNOSIS — J45909 Unspecified asthma, uncomplicated: Secondary | ICD-10-CM

## 2011-08-02 DIAGNOSIS — J309 Allergic rhinitis, unspecified: Secondary | ICD-10-CM

## 2011-08-02 DIAGNOSIS — B20 Human immunodeficiency virus [HIV] disease: Secondary | ICD-10-CM

## 2011-08-02 MED ORDER — METHYLPREDNISOLONE (PAK) 4 MG PO TABS: ORAL_TABLET | ORAL | Status: AC

## 2011-08-02 NOTE — Progress Notes (Signed)
Subjective:    Patient ID: Tina Olson, female    DOB: 09-09-1946, 65 y.o.   MRN: [id number]  HPI Presents to clinic with a ten-day history of sinus congestion, and pressure behind the eyes and a seven-day history of a "tickle" her throat and a productive cough. Denies, fevers, chills, shortness of breath, dyspnea on exertion, pleuritic chest pain, or fatigue. She states no one else in her family has had the symptoms, but does relate that she sleeps frequently either with a fan blowing directly on her underneath an air conditioning vent.   Review of Systems  Constitutional: Negative.   HENT: Positive for congestion, sore throat, rhinorrhea, sneezing, postnasal drip and sinus pressure. Negative for hearing loss, ear pain, nosebleeds, facial swelling, drooling, mouth sores, trouble swallowing, neck pain, neck stiffness, dental problem, voice change, tinnitus and ear discharge.   Eyes: Negative for photophobia, pain, discharge, redness, itching and visual disturbance.  Respiratory: Positive for cough. Negative for shortness of breath and wheezing.   Cardiovascular: Negative.   Gastrointestinal: Negative.   Genitourinary: Negative.   Musculoskeletal: Negative.   Skin: Negative.   Neurological: Negative.   Hematological: Negative.   Psychiatric/Behavioral: Negative.        Objective:   Physical Exam  Constitutional: She is oriented to person, place, and time. She appears well-developed and well-nourished. No distress.  HENT:  Head: Normocephalic and atraumatic.       Slight blurring of bony structures behind. Bilateral tympanic membranes. Some postnasal drainage noted, but pharynx essentially clear without erythema or cobblestoning  Eyes: Conjunctivae and EOM are normal. Pupils are equal, round, and reactive to light. Right eye exhibits no discharge. Left eye exhibits no discharge.  Neck: Normal range of motion. Neck supple.  Cardiovascular: Normal rate, regular rhythm, normal heart  sounds and intact distal pulses.   Pulmonary/Chest: Effort normal and breath sounds normal.  Abdominal: Soft. Bowel sounds are normal.  Musculoskeletal: Normal range of motion.  Lymphadenopathy:    She has no cervical adenopathy.  Neurological: She is alert and oriented to person, place, and time. No cranial nerve deficit. She exhibits normal muscle tone. Coordination normal.  Skin: Skin is warm and dry. No rash noted.  Psychiatric: She has a normal mood and affect. Her behavior is normal. Judgment and thought content normal.          Assessment & Plan:  Allergic Rhinitis/Sinusitis with Reactive Bronchitis. Highly unlikely that this is infectious in nature, based on history and presenting symptoms. More likely, this is allergic in nature, and, perhaps, been precipitated by her consistent use of blowing air at nighttime. We advised her to avoid doing this, using humidification in the room, avoiding any dust or allergens, increasing her fluid intake. We also prescribed a Medrol Dosepak to be used as directed and recommended the following: Cetirizine 10 mg by mouth daily for the next month, normal saline nasal mist frequently, and when necessary, guaifenesin with dextromethorphan, 3 teaspoons by mouth every 4 hours when necessary along with 8-12 ounces fluid, diphenhydramine, 25 mg by mouth each bedtime, and ibuprofen, when necessary for discomfort. If symptoms persist, worsen or she develops a fever, she was instructed to contact the clinic or go to the urgent care center. Otherwise, she was instructed to continue her routine scheduled followup with Dr. Ninetta Lights at the next appointment.  Written material was provided for her regarding allergic rhinitis and bronchitis. She verbally acknowledged all information that was provided to her and agreed with plan of care.

## 2011-08-02 NOTE — Patient Instructions (Signed)
1. Cetirizine (Zyrtec or its equivalent), 10 mg by mouth daily for at least the next month. 2. Diphenhydramine (Benadryl or its equivalent) 25 mg by mouth each bedtime. 3. Guaifenesin with dextromethorphan (Robitussin-DM or its equivalent) 3 teaspoons by mouth every 4 hours when necessary for cough (make certain with each dose. He drink at least 8 ounces of water or fluid.). 4. Normal saline nasal mist to each nostril frequently and as needed. 5. Humidification in the home, especially in the bedroom at bedtime. 6. Avoid sleeping directly under or in front of fans or vents blowing air. 7. Follow directions exactly for use of the prednisone tablets.  Allergic Rhinitis Allergic rhinitis is when the mucous membranes in the nose respond to allergens. Allergens are particles in the air that cause your body to have an allergic reaction. This causes you to release allergic antibodies. Through a chain of events, these eventually cause you to release histamine into the blood stream (hence the use of antihistamines). Although meant to be protective to the body, it is this release that causes your discomfort, such as frequent sneezing, congestion and an itchy runny nose.   CAUSES The pollen allergens may come from grasses, trees, and weeds. This is seasonal allergic rhinitis, or "hay fever." Other allergens cause year-round allergic rhinitis (perennial allergic rhinitis) such as house dust mite allergen, pet dander and mold spores.   SYMPTOMS  Nasal stuffiness (congestion).   Runny, itchy nose with sneezing and tearing of the eyes.   There is often an itching of the mouth, eyes and ears.  It cannot be cured, but it can be controlled with medications. DIAGNOSIS If you are unable to determine the offending allergen, skin or blood testing may find it. TREATMENT  Avoid the allergen.   Medications and allergy shots (immunotherapy) can help.   Hay fever may often be treated with antihistamines in pill or  nasal spray forms. Antihistamines block the effects of histamine. There are over-the-counter medicines that may help with nasal congestion and swelling around the eyes. Check with your caregiver before taking or giving this medicine.  If the treatment above does not work, there are many new medications your caregiver can prescribe. Stronger medications may be used if initial measures are ineffective. Desensitizing injections can be used if medications and avoidance fails. Desensitization is when a patient is given ongoing shots until the body becomes less sensitive to the allergen. Make sure you follow up with your caregiver if problems continue. SEEK MEDICAL CARE IF:    You develop fever (more than 100.38F (38.1 C).   You develop a cough that does not stop easily (persistent).   You have shortness of breath.   You start wheezing.   Symptoms interfere with normal daily activities.  Document Released: 08/21/2001 Document Re-Released: 12/18/2009 [facility] Patient Information 2011 [place], [place]  .Bronchitis Bronchitis is the body's way of reacting to injury and/or infection (inflammation) of the bronchi. Bronchi are the air tubes that extend from the windpipe into the lungs. If the inflammation becomes severe, it may cause shortness of breath.  CAUSES Inflammation may be caused by:  A virus.   Germs (bacteria).   Dust.   Allergens.   Pollutants and many other irritants.  The cells lining the bronchial tree are covered with tiny hairs (cilia). These constantly beat upward, away from the lungs, toward the mouth. This keeps the lungs free of pollutants. When these cells become too irritated and are unable to do their job, mucus begins to  develop. This causes the characteristic cough of bronchitis. The cough clears the lungs when the cilia are unable to do their job. Without either of these protective mechanisms, the mucus would settle in the lungs. Then you would develop  pneumonia. Smoking is a common cause of bronchitis and can contribute to pneumonia. Stopping this habit is the single most important thing you can do to help yourself. TREATMENT  Your caregiver may prescribe an antibiotic if the cough is caused by bacteria. Also, medicines that open up your airways make it easier to breathe. Your caregiver may also recommend or prescribe an expectorant. It will loosen the mucus to be coughed up. Only take over-the-counter or prescription medicines for pain, discomfort, or fever as directed by your caregiver.   Removing whatever causes the problem (smoking, for example) is critical to preventing the problem from getting worse.   Cough suppressants may be prescribed for relief of cough symptoms.   Inhaled medicines may be prescribed to help with symptoms now and to help prevent problems from returning.   For those with recurrent (chronic) bronchitis, there may be a need for steroid medicines.  SEEK IMMEDIATE MEDICAL CARE IF:  During treatment, you develop more pus-like mucus (purulent sputum).   You or your child has an oral temperature above 102, not controlled by medicine.   Your baby is older than 3 months with a rectal temperature of 102 F (38.9 C) or higher.   Your baby is 92 months old or younger with a rectal temperature of 100.4 F (38 C) or higher.   You become progressively more ill.   You have increased difficulty breathing, wheezing, or shortness of breath.  It is necessary to seek immediate medical care if you are elderly or sick from any other disease. MAKE SURE YOU:  Understand these instructions.   Will watch your condition.   Will get help right away if you are not doing well or get worse.  Document Released: 11/26/2005 Document Re-Released: 02/20/2010 [facility] Patient Information 2011 [place], [place].

## 2011-09-24 ENCOUNTER — Other Ambulatory Visit (INDEPENDENT_AMBULATORY_CARE_PROVIDER_SITE_OTHER): Payer: Self-pay

## 2011-09-24 ENCOUNTER — Other Ambulatory Visit: Payer: Self-pay | Admitting: Infectious Diseases

## 2011-09-24 DIAGNOSIS — Z79899 Other long term (current) drug therapy: Secondary | ICD-10-CM

## 2011-09-24 DIAGNOSIS — B20 Human immunodeficiency virus [HIV] disease: Secondary | ICD-10-CM

## 2011-09-24 DIAGNOSIS — Z113 Encounter for screening for infections with a predominantly sexual mode of transmission: Secondary | ICD-10-CM

## 2011-09-24 LAB — LIPID PANEL
Cholesterol: 156 mg/dL (ref 0–200)
LDL Cholesterol: 87 mg/dL (ref 0–99)
Triglycerides: 111 mg/dL (ref <150)

## 2011-09-24 LAB — COMPREHENSIVE METABOLIC PANEL
Albumin: 3.9 g/dL (ref 3.5–5.2)
CO2: 21 mEq/L (ref 19–32)
Glucose, Bld: 104 mg/dL — ABNORMAL HIGH (ref 70–99)
Potassium: 4.2 mEq/L (ref 3.5–5.3)
Sodium: 136 mEq/L (ref 135–145)
Total Protein: 7.8 g/dL (ref 6.0–8.3)

## 2011-09-24 LAB — CBC
HCT: 36.3 % (ref 36.0–46.0)
Hemoglobin: 12 g/dL (ref 12.0–15.0)
MCH: 27.3 pg (ref 26.0–34.0)
MCHC: 33.1 g/dL (ref 30.0–36.0)

## 2011-09-25 LAB — T-HELPER CELL (CD4) - (RCID CLINIC ONLY): CD4 % Helper T Cell: 38 % (ref 33–55)

## 2011-09-25 LAB — HIV-1 RNA QUANT-NO REFLEX-BLD: HIV 1 RNA Quant: 88 copies/mL — ABNORMAL HIGH (ref <20)

## 2011-10-08 ENCOUNTER — Encounter: Payer: Self-pay | Admitting: Infectious Diseases

## 2011-10-08 ENCOUNTER — Ambulatory Visit (INDEPENDENT_AMBULATORY_CARE_PROVIDER_SITE_OTHER): Payer: Self-pay | Admitting: Infectious Diseases

## 2011-10-08 VITALS — BP 167/82 | HR 69 | Temp 98.4°F | Ht 61.5 in | Wt 137.0 lb

## 2011-10-08 DIAGNOSIS — Z113 Encounter for screening for infections with a predominantly sexual mode of transmission: Secondary | ICD-10-CM

## 2011-10-08 DIAGNOSIS — B20 Human immunodeficiency virus [HIV] disease: Secondary | ICD-10-CM

## 2011-10-08 DIAGNOSIS — Z23 Encounter for immunization: Secondary | ICD-10-CM

## 2011-10-08 DIAGNOSIS — Z79899 Other long term (current) drug therapy: Secondary | ICD-10-CM

## 2011-10-08 DIAGNOSIS — I10 Essential (primary) hypertension: Secondary | ICD-10-CM | POA: Insufficient documentation

## 2011-10-08 DIAGNOSIS — B1710 Acute hepatitis C without hepatic coma: Secondary | ICD-10-CM

## 2011-10-08 NOTE — Assessment & Plan Note (Signed)
i offered to start her on anti-HTN medicaitons. She does not want to take rx. She wishes to try to loose weight, exercise, watch her diet. I asked her to recheck her BP at drug store. Will recheck when she returns for her f/u visit.

## 2011-10-08 NOTE — Progress Notes (Signed)
  Subjective:    Patient ID: Tina Olson, female    DOB: 06-09-1946, 65 y.o.   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 She is on no medications. She is not clear why her BP is so high. States that is has been variable since she was seeing Dr Darnelle Catalan.  Attributes this to weight gain. Got a recnet course of steroids due to an allergic reaction.  Last PAP 09-2010.   Review of Systems  Constitutional: Positive for unexpected weight change. Negative for appetite change.  Cardiovascular: Negative for chest pain.  Gastrointestinal: Negative for diarrhea and constipation.  Genitourinary: Negative for dysuria.  Neurological: Negative for headaches.       Objective:   Physical Exam  Constitutional: She appears well-developed and well-nourished.  Eyes: EOM are normal. Pupils are equal, round, and reactive to light.  Neck: Neck supple.  Cardiovascular: Normal rate, regular rhythm and normal heart sounds.   Pulmonary/Chest: Effort normal and breath sounds normal.  Abdominal: Soft. Bowel sounds are normal. There is no tenderness.  Lymphadenopathy:    She has no cervical adenopathy.          Assessment & Plan:

## 2011-10-08 NOTE — Assessment & Plan Note (Signed)
She appears to be doing very well. Gets flu shot today. She is not sexually active. Will see her back in 3-4 months with labs prior.

## 2011-10-08 NOTE — Assessment & Plan Note (Signed)
Her most recent LFTs are normal. Has received Hep A series.

## 2012-01-23 ENCOUNTER — Telehealth: Payer: Self-pay | Admitting: *Deleted

## 2012-01-23 NOTE — Telephone Encounter (Signed)
Pt scheduled for lab, MD and PAP smear appts.

## 2012-02-04 ENCOUNTER — Other Ambulatory Visit (INDEPENDENT_AMBULATORY_CARE_PROVIDER_SITE_OTHER): Payer: Self-pay

## 2012-02-04 DIAGNOSIS — B20 Human immunodeficiency virus [HIV] disease: Secondary | ICD-10-CM

## 2012-02-04 DIAGNOSIS — Z79899 Other long term (current) drug therapy: Secondary | ICD-10-CM

## 2012-02-04 DIAGNOSIS — B1710 Acute hepatitis C without hepatic coma: Secondary | ICD-10-CM

## 2012-02-04 LAB — COMPLETE METABOLIC PANEL WITH GFR
ALT: 23 U/L (ref 0–35)
AST: 37 U/L (ref 0–37)
Alkaline Phosphatase: 57 U/L (ref 39–117)
CO2: 19 mEq/L (ref 19–32)
GFR, Est African American: 73 mL/min
Sodium: 137 mEq/L (ref 135–145)
Total Bilirubin: 0.8 mg/dL (ref 0.3–1.2)
Total Protein: 7.3 g/dL (ref 6.0–8.3)

## 2012-02-04 LAB — CBC
MCH: 27.1 pg (ref 26.0–34.0)
Platelets: 134 10*3/uL — ABNORMAL LOW (ref 150–400)
RBC: 4.54 MIL/uL (ref 3.87–5.11)
RDW: 13.6 % (ref 11.5–15.5)

## 2012-02-04 LAB — LIPID PANEL
HDL: 51 mg/dL (ref >39)
LDL Cholesterol: 91 mg/dL (ref 0–99)
Total CHOL/HDL Ratio: 3.1 Ratio
VLDL: 17 mg/dL (ref 0–40)

## 2012-02-05 LAB — T-HELPER CELL (CD4) - (RCID CLINIC ONLY): CD4 T Cell Abs: 760 uL (ref 400–2700)

## 2012-02-06 LAB — HIV-1 RNA QUANT-NO REFLEX-BLD: HIV 1 RNA Quant: 98 copies/mL — ABNORMAL HIGH (ref <20)

## 2012-02-15 ENCOUNTER — Ambulatory Visit: Payer: Self-pay

## 2012-02-15 ENCOUNTER — Ambulatory Visit (INDEPENDENT_AMBULATORY_CARE_PROVIDER_SITE_OTHER): Payer: Self-pay | Admitting: *Deleted

## 2012-02-15 DIAGNOSIS — Z124 Encounter for screening for malignant neoplasm of cervix: Secondary | ICD-10-CM

## 2012-02-15 NOTE — Patient Instructions (Signed)
  Your results will be ready in about a week.  I will mail them to you.  Thank you for coming to the Center for your care.  Denise 

## 2012-02-15 NOTE — Progress Notes (Signed)
  Subjective:     Tina Olson is a 66 y.o. woman who comes in today for a  pap smear only.  Objective:    There were no vitals taken for this visit. Pelvic Exam: Pap smear obtained.   Assessment:    Screening pap smear.   Plan:    Follow up in one year, or as indicated by Pap results.   Pt given educational materials re:  Pap smears, BSE, nutrition, diet, exercise and self-esteem.  Pt given condoms.

## 2012-02-18 ENCOUNTER — Ambulatory Visit: Payer: Self-pay | Admitting: Infectious Diseases

## 2012-02-19 ENCOUNTER — Ambulatory Visit (INDEPENDENT_AMBULATORY_CARE_PROVIDER_SITE_OTHER): Payer: Self-pay | Admitting: Infectious Diseases

## 2012-02-19 ENCOUNTER — Encounter: Payer: Self-pay | Admitting: Infectious Diseases

## 2012-02-19 ENCOUNTER — Encounter: Payer: Self-pay | Admitting: *Deleted

## 2012-02-19 ENCOUNTER — Ambulatory Visit: Payer: Self-pay

## 2012-02-19 DIAGNOSIS — B20 Human immunodeficiency virus [HIV] disease: Secondary | ICD-10-CM

## 2012-02-19 DIAGNOSIS — I10 Essential (primary) hypertension: Secondary | ICD-10-CM

## 2012-02-19 DIAGNOSIS — B1710 Acute hepatitis C without hepatic coma: Secondary | ICD-10-CM

## 2012-02-19 MED ORDER — LISINOPRIL 5 MG PO TABS: 5.0000 mg | ORAL_TABLET | Freq: Every day | ORAL | Status: DC

## 2012-02-19 NOTE — Assessment & Plan Note (Addendum)
She continues to do well off ART. Will repeat her elisa/wb/rna at next visit. Offered/refuses condoms (got some with PAP, gave them away). vax up to date. PAP nl last week.

## 2012-02-19 NOTE — Assessment & Plan Note (Signed)
Will start her on ACE-I. See her back in 1 month to recheck BP and renal function. Asked her to call us back if med is not on $4 list at [facility] and we will txf to [facility].

## 2012-02-19 NOTE — Progress Notes (Signed)
  Subjective:    Patient ID: Tina Olson, female    DOB: 03/28/1946, 66 y.o.   MRN: [id number]  HPI 66 yo F with HIV+ since 1994 (tested due to anorexia and wt loss) , HTN, Hepatitis C (1b). She has not wanted to start ART or anti-htn rx previously.  HIV 1 RNA Quant (copies/mL)  Date Value  02/04/2012 98*  09/24/2011 88*  04/18/2011 61*     CD4 T Cell Abs (cmm)  Date Value  02/04/2012 760   09/24/2011 780   04/18/2011 780 BLOOD/ANTICOAG RATIO IN TUBE UNSATISFACTORY     Feeling well. Taking no medications. Had PAP last week. Trying to lose wt but has gained 2# since last visit. Has quit eating fast food.  Lives with mother who is bed bound and son who has epilepsy.     Review of Systems  Constitutional: Negative for appetite change and unexpected weight change.  Respiratory: Negative for shortness of breath.   Cardiovascular: Negative for chest pain.  Gastrointestinal: Negative for diarrhea and constipation.  Genitourinary: Negative for dysuria.  Neurological: Negative for light-headedness and headaches.       Objective:   Physical Exam  Constitutional: She appears well-developed and well-nourished.  HENT:  Mouth/Throat: No oropharyngeal exudate.  Eyes: EOM are normal. Pupils are equal, round, and reactive to light.  Neck: Neck supple.  Cardiovascular: Normal rate, regular rhythm and normal heart sounds.   Pulmonary/Chest: Effort normal and breath sounds normal.  Abdominal: Soft. Bowel sounds are normal. She exhibits no distension. There is no tenderness.  Musculoskeletal: She exhibits no edema.  Lymphadenopathy:    She has no cervical adenopathy.          Assessment & Plan:

## 2012-02-19 NOTE — Assessment & Plan Note (Signed)
Will continue to follow her LFTs. Has gotten hep A vax.

## 2012-02-22 ENCOUNTER — Telehealth: Payer: Self-pay | Admitting: *Deleted

## 2012-02-22 NOTE — Telephone Encounter (Signed)
Patient called c/o of reaction to lisinopril, stated it was causing itching and a dry cough. Her blood pressure at visit was 154/83. Advised her to stop medication and will let Dr. Ninetta Lights know on Monday. Will call her back with his decision regarding alternate medication. Wendall Mola CMA

## 2012-02-25 ENCOUNTER — Other Ambulatory Visit: Payer: Self-pay | Admitting: *Deleted

## 2012-02-25 ENCOUNTER — Telehealth: Payer: Self-pay | Admitting: *Deleted

## 2012-02-25 DIAGNOSIS — I10 Essential (primary) hypertension: Secondary | ICD-10-CM

## 2012-02-25 MED ORDER — AMLODIPINE BESYLATE 5 MG PO TABS: 5.0000 mg | ORAL_TABLET | Freq: Every day | ORAL | Status: DC

## 2012-02-25 NOTE — Telephone Encounter (Signed)
Notified patient Dr. Ninetta Lights changed her Lisinopril to Norvasc due to allergy and I sent to [facility] on [address]. Wendall Mola CMA

## 2012-03-24 ENCOUNTER — Ambulatory Visit (INDEPENDENT_AMBULATORY_CARE_PROVIDER_SITE_OTHER): Payer: Self-pay | Admitting: Infectious Diseases

## 2012-03-24 ENCOUNTER — Encounter: Payer: Self-pay | Admitting: Infectious Diseases

## 2012-03-24 VITALS — BP 130/76 | HR 63 | Temp 98.2°F | Wt 135.0 lb

## 2012-03-24 DIAGNOSIS — I10 Essential (primary) hypertension: Secondary | ICD-10-CM

## 2012-03-24 DIAGNOSIS — B20 Human immunodeficiency virus [HIV] disease: Secondary | ICD-10-CM

## 2012-03-24 DIAGNOSIS — Z113 Encounter for screening for infections with a predominantly sexual mode of transmission: Secondary | ICD-10-CM

## 2012-03-24 DIAGNOSIS — B1710 Acute hepatitis C without hepatic coma: Secondary | ICD-10-CM

## 2012-03-24 NOTE — Progress Notes (Signed)
  Subjective:    Patient ID: Tina Olson, female    DOB: February 04, 1946, 66 y.o.   MRN: [id number]  HPI 66 yo F with hx of HIV+, Hep C, HTN. ? [employer] controller. NL pap 02-15-12. Has been feeling better, has NL BP today.  HIV 1 RNA Quant (copies/mL)  Date Value  02/04/2012 98*  09/24/2011 88*  04/18/2011 61*     CD4 T Cell Abs (cmm)  Date Value  02/04/2012 760   09/24/2011 780   04/18/2011 780 BLOOD/ANTICOAG RATIO IN TUBE UNSATISFACTORY      Review of Systems  Constitutional: Negative for appetite change and unexpected weight change.  Respiratory: Negative for shortness of breath.   Cardiovascular: Negative for chest pain.  Gastrointestinal: Negative for diarrhea and constipation.  Genitourinary: Negative for dysuria.       Otilio Carpen takes MOM. occas takes ant-acid rx for food in her throat.   Neurological: Negative for headaches.       Objective:   Physical Exam  Constitutional: She appears well-developed and well-nourished.  HENT:  Mouth/Throat: No oropharyngeal exudate.  Eyes: EOM are normal. Pupils are equal, round, and reactive to light.  Neck: Neck supple.  Cardiovascular: Normal rate, regular rhythm and normal heart sounds.   Pulmonary/Chest: Effort normal and breath sounds normal.  Abdominal: Soft. Bowel sounds are normal. She exhibits no distension. There is no tenderness.  Lymphadenopathy:    She has no cervical adenopathy.          Assessment & Plan:

## 2012-03-24 NOTE — Assessment & Plan Note (Signed)
Doing well on norvasc. Asx.

## 2012-03-24 NOTE — Assessment & Plan Note (Addendum)
Meeting with [facility] (she defers due to # of blood sticks and her need for arterial draws). Offered/refused condoms. Vax, PAP up to date. rtc in 6 months.

## 2012-03-24 NOTE — Assessment & Plan Note (Signed)
continue to follow her LFTs. Has been vax for Hep A.

## 2012-06-26 ENCOUNTER — Encounter (HOSPITAL_COMMUNITY): Payer: Self-pay | Admitting: *Deleted

## 2012-06-26 ENCOUNTER — Emergency Department (HOSPITAL_COMMUNITY)
Admission: EM | Admit: 2012-06-26 | Discharge: 2012-06-26 | Disposition: A | Discharge status: Home / Self Care | Payer: Self-pay | Attending: Emergency Medicine | Admitting: Emergency Medicine

## 2012-06-26 ENCOUNTER — Emergency Department (HOSPITAL_COMMUNITY): Payer: Self-pay

## 2012-06-26 DIAGNOSIS — Z21 Asymptomatic human immunodeficiency virus [HIV] infection status: Secondary | ICD-10-CM | POA: Insufficient documentation

## 2012-06-26 DIAGNOSIS — K5900 Constipation, unspecified: Secondary | ICD-10-CM | POA: Insufficient documentation

## 2012-06-26 DIAGNOSIS — I10 Essential (primary) hypertension: Secondary | ICD-10-CM | POA: Insufficient documentation

## 2012-06-26 DIAGNOSIS — R42 Dizziness and giddiness: Secondary | ICD-10-CM | POA: Insufficient documentation

## 2012-06-26 DIAGNOSIS — Z87891 Personal history of nicotine dependence: Secondary | ICD-10-CM | POA: Insufficient documentation

## 2012-06-26 HISTORY — DX: Unspecified viral hepatitis C without hepatic coma: B19.20

## 2012-06-26 LAB — CBC
HCT: 36.9 % (ref 36.0–46.0)
MCH: 27.4 pg (ref 26.0–34.0)
MCHC: 33.1 g/dL (ref 30.0–36.0)
MCV: 82.7 fL (ref 78.0–100.0)
Platelets: 170 10*3/uL (ref 150–400)
RDW: 12.8 % (ref 11.5–15.5)

## 2012-06-26 LAB — COMPREHENSIVE METABOLIC PANEL
AST: 33 U/L (ref 0–37)
Albumin: 3.8 g/dL (ref 3.5–5.2)
BUN: 17 mg/dL (ref 6–23)
Calcium: 10 mg/dL (ref 8.4–10.5)
Chloride: 106 mEq/L (ref 96–112)
Creatinine, Ser: 1.17 mg/dL — ABNORMAL HIGH (ref 0.50–1.10)
Total Bilirubin: 0.6 mg/dL (ref 0.3–1.2)
Total Protein: 8.4 g/dL — ABNORMAL HIGH (ref 6.0–8.3)

## 2012-06-26 MED ORDER — SODIUM CHLORIDE 0.9 % IV BOLUS (SEPSIS)
1000.0000 mL | Freq: Once | INTRAVENOUS | Status: AC
  Administered 2012-06-26: 1000 mL via INTRAVENOUS

## 2012-06-26 MED ORDER — POLYETHYLENE GLYCOL 3350 17 GM/SCOOP PO POWD: 17.0000 g | Freq: Every day | ORAL | Status: AC

## 2012-06-26 NOTE — ED Notes (Signed)
PA at bedside.

## 2012-06-26 NOTE — ED Notes (Signed)
WUJ:[id number]<JY> Expected date:06/26/12<BR> Expected time:<BR> Means of arrival:Ambulance<BR> Comments:<BR> Abdominal pain

## 2012-06-26 NOTE — ED Provider Notes (Addendum)
Angiocath insertion Performed by: Lyanne Co Consent: Verbal consent obtained. Risks and benefits: risks, benefits and alternatives were discussed Time out: Immediately prior to procedure a "time out" was called to verify the correct patient, procedure, equipment, support staff and site/side marked as required. Preparation: Patient was prepped and draped in the usual sterile fashion. Vein Location: Right basilic Ultrasound Guided Gauge: 20 Normal blood return and flush without difficulty Patient tolerance: Patient tolerated the procedure well with no immediate complications.     Lyanne Co, MD 06/26/12 1317  1:57 PM Prior to completion of the IV bolus the patient's IV infiltrated.  She does not want another IV at this time.  She reports she'll drink oral fluids.  All of her symptoms of near syncope sound like mild volume depletion.  Now she is walking around the emergency department and feels much better.  Her abdominal exam is benign.  Her distention is likely secondary to constipation.  She's not tender in her abdomen.  Home with [employer].  She's been encouraged to drink lots of fluids.  Her blood work otherwise without significant abnormality.  Close followup with her primary care physician is recommended.  She has no chest pain shortness of breath.  She has no palpitations.  Lyanne Co, MD 06/26/12 1359  Lyanne Co, MD 06/26/12 [phone]

## 2012-06-26 NOTE — ED Provider Notes (Addendum)
History     CSN: [id number]  Arrival date & time 06/26/12  1134   First MD Initiated Contact with Patient 06/26/12 1150      Chief Complaint  Patient presents with  . Abdominal Pain  . Constipation    (Consider location/radiation/quality/duration/timing/severity/associated sxs/prior treatment) HPI Comments: Patient is 66 y/o AA F with with HX of HIV, Hep C, and HTN presents today with CC of presyncope and abdominal distention.  [name] states that she had two episode of brief presyncope, on on Tuesday and one this morning, both after standing rapidly.  She had a third episode this morning while walking to the mailbox  In the heat that was much worse.  She had difficulty making it back to the house.  She states that itt took several minutes to resolve and that she still felt as though she were going to pass out while lying down. She aslo felt pressure in her chest and shortness of breath while reclining,She attributes this to her abdominal distention.   She denies history of heart disease. She denies racing or skipping heart.  She denies swelling in extremities. She sleeps with 1 pillow. She has been eating and drinking regularly, has not been sweating profusely and has air conditioning in her home.  Patient believes she is constipated. She had one large BM 1 week ago after use of a laxative.  She used the laxiative 2 days ago and only passed a small BM. Distention began this morning and has gotten worse.  She denies pain, N/V, melena or hematochezia. She denies fevers, chills, or myalgias. She believes she has gas.  Patient is a 66 y.o. female presenting with abdominal pain and constipation. The history is provided by the patient, medical records and a parent.  Abdominal Pain The primary symptoms of the illness do not include abdominal pain, fever, nausea, diarrhea or dysuria.  Additional symptoms associated with the illness include constipation. Symptoms associated with the illness do not  include frequency.  Constipation  Pertinent negatives include no fever, no abdominal pain, no diarrhea and no nausea.    Past Medical History  Diagnosis Date  . HIV infection   . Hypertension   . Hepatitis C     History reviewed. No pertinent past surgical history.  Family History  Problem Relation Age of Onset  . Alzheimer's disease Mother   . Glaucoma Mother   . Hypertension Mother   . Cancer Father     ? type    History  Substance Use Topics  . Smoking status: Former Smoker    Types: Cigarettes    Quit date: 12/10/2000  . Smokeless tobacco: Never Used  . Alcohol Use: No    OB History    Grav Para Term Preterm Abortions TAB SAB Ect Mult Living                  Review of Systems  Constitutional: Negative for fever and appetite change.  Respiratory: Negative for chest tightness.   Gastrointestinal: Positive for constipation and abdominal distention. Negative for nausea, abdominal pain, diarrhea and blood in stool.  Genitourinary: Negative for dysuria and frequency.  Neurological: Positive for light-headedness.  All other systems reviewed and are negative.    Allergies  Lisinopril  Home Medications   Current Outpatient Rx  Name Route Sig Dispense Refill  . AMLODIPINE BESYLATE 5 MG PO TABS Oral Take 5 mg by mouth daily.    . OMEGA-3 FATTY ACIDS 1000 MG PO CAPS Oral  Take 1 g by mouth daily.      BP 130/86  Pulse 99  Temp 97.9 F (36.6 C)  Resp 16  SpO2 97%  Physical Exam  Nursing note and vitals reviewed. Constitutional: She is oriented to person, place, and time. She appears well-developed and well-nourished. No distress.       Well appearing  HENT:  Head: Normocephalic and atraumatic.  Eyes: Conjunctivae are normal. No scleral icterus.  Cardiovascular: Normal rate, regular rhythm, normal heart sounds and intact distal pulses.   Pulmonary/Chest: Effort normal. No respiratory distress. She has no wheezes. She has no rales.  Abdominal: Soft.  Bowel sounds are normal. She exhibits distension. She exhibits no mass. There is no tenderness. There is no guarding.  Musculoskeletal: She exhibits no edema.  Neurological: She is alert and oriented to person, place, and time.  Skin: Skin is warm and dry.    ED Course  Procedures (including critical care time)  Labs Reviewed  CBC - Abnormal; Notable for the following:    WBC 3.5 (*)     All other components within normal limits  COMPREHENSIVE METABOLIC PANEL - Abnormal; Notable for the following:    Potassium 3.4 (*)     Creatinine, Ser 1.17 (*)     Total Protein 8.4 (*)     GFR calc non Af Amer 48 (*)     GFR calc Af Amer 56 (*)     All other components within normal limits  minimal Leukopenia, consistent with previous Labs. Dg Abd 2 Views  06/26/2012  *RADIOLOGY REPORT*  Clinical Data: Upper abdominal pain and constipation.  ABDOMEN - 2 VIEW  Comparison: 09/22/2010.  Findings: Stool is seen in the majority of the colon.  No small bowel dilatation.  IMPRESSION: Constipation.  Original Report Authenticated By: Reyes Ivan, M.D.   Xray reviewed by Arthor Captain Distention likely due to gas.   Orthostatics are negative.  Patient taking oral fluids.  Will reevaluate after fluid intake.    No results found.   1. Lightheaded   2. Constipation       MDM   Patient is feeling better. VS stable.  Discharge home with instructions to return. BP 128/75  Pulse 87  Temp 97.9 F (36.6 C)  Resp 16  SpO2 100%        Arthor Captain, PA-C 06/26/12 1357  Arthor Captain, PA-C 06/26/12 1511

## 2012-06-26 NOTE — ED Provider Notes (Signed)
Medical screening examination/treatment/procedure(s) were conducted as a shared visit with non-physician practitioner(s) and myself.  I personally evaluated the patient during the encounter  Please see my other note  Lyanne Co, MD 06/26/12 1359

## 2012-06-26 NOTE — ED Notes (Signed)
Md at bedside attempting IV

## 2012-06-26 NOTE — ED Notes (Signed)
Per ems pt is from home. Alert and oriented x4. Pt reports taking laxatives 3 days ago and has not had a bowel movement since. Complaints of upper abdominal pain. ems reports pt said that during upper abdominal palpation that this relieved the pain a little. Pt states "she feels like she has to burn, but cant". Been to the ED for similar past complaints.

## 2012-06-26 NOTE — ED Notes (Signed)
md alerted pts IV possibly infiltrated. md checked with pt and pt does not want another IV. Pt will drink PO fluids

## 2012-06-26 NOTE — ED Provider Notes (Signed)
Medical screening examination/treatment/procedure(s) were conducted as a shared visit with non-physician practitioner(s) and myself.  I personally evaluated the patient during the encounter  Please see my other note  Lyanne Co, MD 06/26/12 [phone]

## 2012-07-01 ENCOUNTER — Other Ambulatory Visit: Payer: Self-pay

## 2012-07-01 NOTE — Telephone Encounter (Signed)
Pt states she went to ED by ambulance last Thursday.  Pt is having dizziness with activity with shortness of breath  She wonders if she will need an office visit since she is also having palpitations. Symptoms are relieved with rest.  Bilateral hand numbness present at times.  Pt scheduled for f/u ED visit on Wednesday, July 224, 2013.  Laurell Josephs, RN

## 2012-07-02 ENCOUNTER — Ambulatory Visit (INDEPENDENT_AMBULATORY_CARE_PROVIDER_SITE_OTHER): Payer: Self-pay | Admitting: Internal Medicine

## 2012-07-02 ENCOUNTER — Encounter: Payer: Self-pay | Admitting: Internal Medicine

## 2012-07-02 VITALS — BP 129/81 | HR 65 | Temp 98.8°F | Wt 131.0 lb

## 2012-07-02 DIAGNOSIS — I10 Essential (primary) hypertension: Secondary | ICD-10-CM

## 2012-07-02 MED ORDER — HYDROCHLOROTHIAZIDE 25 MG PO TABS: 12.5000 mg | ORAL_TABLET | Freq: Every day | ORAL | Status: DC

## 2012-07-02 NOTE — Progress Notes (Signed)
HIV CLINIC  RFV: dizziness and recent ED visit Subjective:    Patient ID: Tina Olson, female    DOB: [birth year], 66 y.o.   MRN: [id number]  HPI 66yo F with HIV/HCV, long-term non-progressor, CD 4 count 760 / 98 viral copies in Feb 2013, not on ART.She went to the ED  On 7/18, for near-syncope event. She states that she had been having episodes of "wave of lightheadedness", occassional heart palpitations, and some Tingling in fingers; she denies the room spinning around, no nausea, no diapheresis.  No chest pressure or chest pain persay. If she lays down and gets up easy and it gets better. Symptoms have been occurring for 10 days. In the ED, she had piv misplaced, infiltrated. Now better in RUE.  She also is noticing having increased abdominal distention relieved by belching or passing gas. She currently takes miralax once a day to help keep her bowel regimen regular.  Current Outpatient Prescriptions on File Prior to Visit  Medication Sig Dispense Refill  . fish oil-omega-3 fatty acids 1000 MG capsule Take 1 g by mouth daily.      . hydrochlorothiazide (HYDRODIURIL) 25 MG tablet Take 0.5 tablets (12.5 mg total) by mouth daily.  30 tablet  6   Active Ambulatory Problems    Diagnosis Date Noted  . HIV DISEASE 04/18/2009  . HEPATITIS C 04/18/2009  . UTI 06/05/2010  . BREAST MASS, BENIGN 04/18/2009  . ABDOMINAL PAIN, UNSPECIFIED SITE 09/22/2010  . HEMATOCHEZIA, HX OF 04/18/2009  . SHELLFISH ALLERGY 10/20/2009  . Hypertension 10/08/2011   Resolved Ambulatory Problems    Diagnosis Date Noted  . No Resolved Ambulatory Problems   Past Medical History  Diagnosis Date  . HIV infection   . Hepatitis C     Review of Systems 10 point reviewed, positive pertinents in hpi. Otherwise, negative.     Objective:   Physical Exam  BP 129/81  Pulse 65  Temp 98.8 F (37.1 C) (Oral)  Wt 131 lb (59.421 kg)  SpO2 100% Physical Exam  Constitutional:  appears well-developed and  well-nourished. No distress.  HENT: [place]/AT,  PERRLA, EOMI, no scleral icterus Mouth/Throat: Oropharynx is clear and moist. No oropharyngeal exudate.  Cardiovascular: Normal rate, regular rhythm and normal heart sounds. Exam reveals no gallop and no friction rub. No murmur heard.  Pulmonary/Chest: Effort normal and breath sounds normal. No respiratory distress.  no wheezes.  Abdominal: Soft. Bowel sounds are normal. mildly distension. There is no tenderness.  Lymphadenopathy: no cervical adenopathy.  Skin: Skin is warm and dry. No rash noted. No erythema. Right upper arm non tender     Assessment & Plan:  Pre-syncope= she describes an element of palpitations but also has baseline low HR. Wondering if drug related,? amlodipine. Will ask her to stop amlodipine and switch to hctz 12.5mg  daily. Will have her come back at her next appt to check her BP to see if need to increase to 25mg  daily.  I have asked the patient to call next week to see if she is having symptoms. If so, will have her come in sooner to consider getting holter monitor to evaluate for sick sinus syndrome and also check ekg.  Increased abd distention = possibly related to miralax. i have asked her to cut her dose down slightly and see if any symptoms improved.  rtc in aug-sep

## 2012-07-07 ENCOUNTER — Encounter (HOSPITAL_COMMUNITY): Payer: Self-pay | Admitting: Emergency Medicine

## 2012-07-07 ENCOUNTER — Emergency Department (HOSPITAL_COMMUNITY)
Admission: EM | Admit: 2012-07-07 | Discharge: 2012-07-07 | Disposition: A | Discharge status: Home / Self Care | Payer: Self-pay | Attending: Emergency Medicine | Admitting: Emergency Medicine

## 2012-07-07 ENCOUNTER — Emergency Department (HOSPITAL_COMMUNITY): Payer: Self-pay

## 2012-07-07 ENCOUNTER — Telehealth: Payer: Self-pay | Admitting: Licensed Clinical Social Worker

## 2012-07-07 DIAGNOSIS — I10 Essential (primary) hypertension: Secondary | ICD-10-CM | POA: Insufficient documentation

## 2012-07-07 DIAGNOSIS — Z87891 Personal history of nicotine dependence: Secondary | ICD-10-CM | POA: Insufficient documentation

## 2012-07-07 DIAGNOSIS — Z7982 Long term (current) use of aspirin: Secondary | ICD-10-CM | POA: Insufficient documentation

## 2012-07-07 DIAGNOSIS — Z21 Asymptomatic human immunodeficiency virus [HIV] infection status: Secondary | ICD-10-CM | POA: Insufficient documentation

## 2012-07-07 DIAGNOSIS — I951 Orthostatic hypotension: Secondary | ICD-10-CM

## 2012-07-07 LAB — CBC WITH DIFFERENTIAL/PLATELET
Basophils Absolute: 0 K/uL (ref 0.0–0.1)
Basophils Relative: 0 % (ref 0–1)
Eosinophils Absolute: 0 K/uL (ref 0.0–0.7)
Eosinophils Relative: 0 % (ref 0–5)
HCT: 43.3 % (ref 36.0–46.0)
Hemoglobin: 14.8 g/dL (ref 12.0–15.0)
Lymphocytes Relative: 51 % — ABNORMAL HIGH (ref 12–46)
Lymphs Abs: 2.5 10*3/uL (ref 0.7–4.0)
MCH: 28.2 pg (ref 26.0–34.0)
MCHC: 34.2 g/dL (ref 30.0–36.0)
MCV: 82.5 fL (ref 78.0–100.0)
Monocytes Absolute: 0.4 K/uL (ref 0.1–1.0)
Monocytes Relative: 8 % (ref 3–12)
Neutro Abs: 2 K/uL (ref 1.7–7.7)
Neutrophils Relative %: 41 % — ABNORMAL LOW (ref 43–77)
Platelets: 145 10*3/uL — ABNORMAL LOW (ref 150–400)
RBC: 5.25 MIL/uL — ABNORMAL HIGH (ref 3.87–5.11)
RDW: 12.7 % (ref 11.5–15.5)
WBC: 4.8 10*3/uL (ref 4.0–10.5)

## 2012-07-07 LAB — COMPREHENSIVE METABOLIC PANEL WITH GFR
Albumin: 4.3 g/dL (ref 3.5–5.2)
Alkaline Phosphatase: 65 U/L (ref 39–117)
BUN: 14 mg/dL (ref 6–23)
Calcium: 11.3 mg/dL — ABNORMAL HIGH (ref 8.4–10.5)
Creatinine, Ser: 0.93 mg/dL (ref 0.50–1.10)
GFR calc Af Amer: 74 mL/min — ABNORMAL LOW (ref >90)
Potassium: 4.7 meq/L (ref 3.5–5.1)
Total Protein: 9.5 g/dL — ABNORMAL HIGH (ref 6.0–8.3)

## 2012-07-07 LAB — TROPONIN I: Troponin I: <0.3 ng/mL (ref <0.30)

## 2012-07-07 LAB — COMPREHENSIVE METABOLIC PANEL
ALT: 32 U/L (ref 0–35)
AST: 42 U/L — ABNORMAL HIGH (ref 0–37)
CO2: 20 mEq/L (ref 19–32)
Chloride: 100 mEq/L (ref 96–112)
GFR calc non Af Amer: 64 mL/min — ABNORMAL LOW (ref >90)
Glucose, Bld: 98 mg/dL (ref 70–99)
Sodium: 138 mEq/L (ref 135–145)
Total Bilirubin: 0.9 mg/dL (ref 0.3–1.2)

## 2012-07-07 MED ORDER — SODIUM CHLORIDE 0.9 % IV BOLUS (SEPSIS): 1000.0000 mL | Freq: Once | INTRAVENOUS | Status: DC

## 2012-07-07 NOTE — ED Provider Notes (Signed)
History     CSN: [id number]  Arrival date & time 07/07/12  1221   First MD Initiated Contact with Patient 07/07/12 1503      Chief Complaint  Patient presents with  . Irregular Heart Beat    (Consider location/radiation/quality/duration/timing/severity/associated sxs/prior treatment) HPI Comments: Pt has been on BP meds since March, was on lisinopril, developed allergy, then on 5 mg norvasc.  She developed abd swelling and palpitations and feeling lightheaded and seen at [facility] a few weeks ago.  She continues to have episodes of palpitations described as rapid HR, SOB, tingling to hands and fingers.  She will feel lightheaded at times, has not fainted.  She discussed with PMD and put on 1/2 tablet of HCTZ rather than norvasc thinking it was too strong for her.  She denies fevers, chills, CP, pleurisy, lower leg swelling or pain, no recent long distance travel.  No cold symptoms, N/V/D.  She was told to take miralox previously for abd swelling thinking she might be constipated and she has had results.  She reports laying down and calming herself will improve symptoms.    The history is provided by the patient and medical records.    Past Medical History  Diagnosis Date  . HIV infection   . Hypertension   . Hepatitis C     History reviewed. No pertinent past surgical history.  Family History  Problem Relation Age of Onset  . Alzheimer's disease Mother   . Glaucoma Mother   . Hypertension Mother   . Cancer Father     ? type    History  Substance Use Topics  . Smoking status: Former Smoker    Types: Cigarettes    Quit date: 12/10/2000  . Smokeless tobacco: Never Used  . Alcohol Use: No    OB History    Grav Para Term Preterm Abortions TAB SAB Ect Mult Living                  Review of Systems  Constitutional: Negative.   Cardiovascular: Positive for palpitations. Negative for chest pain and leg swelling.  Gastrointestinal: Positive for constipation and abdominal  distention. Negative for nausea, vomiting and diarrhea.  Musculoskeletal: Negative for back pain.  Neurological: Positive for light-headedness and numbness. Negative for syncope and weakness.  All other systems reviewed and are negative.    Allergies  Lisinopril and Other  Home Medications   Current Outpatient Rx  Name Route Sig Dispense Refill  . ASPIRIN EC 81 MG PO TBEC Oral Take 81 mg by mouth once.    Marland Kitchen HYDROCHLOROTHIAZIDE 25 MG PO TABS Oral Take 0.5 tablets (12.5 mg total) by mouth daily. 30 tablet 6  . OVER THE COUNTER MEDICATION Oral Take 1 tablet by mouth daily as needed. Gas Relief. As needed for gas.    Marland Kitchen POLYETHYLENE GLYCOL 3350 PO PACK Oral Take 17 g by mouth daily.      BP 128/80  Pulse 67  Temp 98.2 F (36.8 C)  Resp 16  SpO2 100%  Physical Exam  Nursing note and vitals reviewed. Constitutional: She is oriented to person, place, and time. She appears well-developed and well-nourished. No distress.  HENT:  Head: Normocephalic and atraumatic.  Eyes: Pupils are equal, round, and reactive to light. No scleral icterus.  Neck: Normal range of motion. Neck supple.  Cardiovascular: Normal rate.   No murmur heard. Pulmonary/Chest: Effort normal. No respiratory distress. She has no wheezes. She has no rales.  Abdominal: Soft. She  exhibits no distension. There is no tenderness. There is no rebound.  Musculoskeletal: She exhibits no edema and no tenderness.  Neurological: She is alert and oriented to person, place, and time. Coordination normal.  Skin: Skin is warm and dry. No rash noted. She is not diaphoretic. No erythema.  Psychiatric: She has a normal mood and affect.    ED Course  Procedures (including critical care time)  Labs Reviewed  CBC WITH DIFFERENTIAL - Abnormal; Notable for the following:    RBC 5.25 (*)     Platelets 145 (*)     Neutrophils Relative 41 (*)     Lymphocytes Relative 51 (*)     All other components within normal limits    COMPREHENSIVE METABOLIC PANEL - Abnormal; Notable for the following:    Calcium 11.3 (*)     Total Protein 9.5 (*)     AST 42 (*)     GFR calc non Af Amer 64 (*)     GFR calc Af Amer 74 (*)     All other components within normal limits  TROPONIN I   Dg Chest 2 View  07/07/2012  *RADIOLOGY REPORT*  Clinical Data: Irregular heart beat, palpitations  CHEST - 2 VIEW  Comparison: 06/02/2010  Findings: Normal heart size and vascularity.  Mild nonspecific interstitial prominence in the lower lobes.  No definite CHF, edema, pneumonia, collapse, consolidation, effusion or pneumothorax.  Trachea midline.  Atherosclerosis of the aorta.  IMPRESSION: Nonspecific basilar interstitial prominence.  No definite acute process  Original Report Authenticated By: Judie Petit. Ruel Favors, M.D.     No diagnosis found.  RA sat is 100% which I interpret to be normal.     ECG at time 12:22 shows NSR at rate 62, normal axis, non specific ST diffusely, unchanged from ECG on Jan 15, 2010.   4:30 PM Pt is slightly orthostatic, pt's symptoms worse with standing too quickly. SBP down more than 20 mmHg between stand and sit.  Will bolus with IVF's, and recommend outpt follow up and Holter monitoring.  Not anemic, electrolyte panel is normal.     4:38 PM Pt is refusing IVF's.  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  I think this is ok, she can orally rehydrate, and I would recommend stopping all BP meds for now and have this rechecked with PCP.  I think Holter Monitor is appropriate.  Referral for stress test is not a bad idea either due to non specific ECG's, albeit stable.  Will discuss with her PCP.    4:46 PM Spoke to ID physician who is in agreement.    MDM  Some of patient's symptoms sound like anxiety and hyperventilation.  Laying and calming herself she reports improves symptoms seem to confirm this.  However, what is  causing this to occur is unsure.  No sweats, nausea or CP, so I doubt ACS.  Pt likely requires Holter Monitoring which can be done as outpt in my opinion.  Troponin is neg.  I will discuss with her PMD, Dr. Ninetta Lights.          Gavin Pound. Garo Heidelberg, MD 07/07/12 1646

## 2012-07-07 NOTE — ED Notes (Signed)
Has been dizzy and her abd has been swelling has been taking miralax like she was told to

## 2012-07-07 NOTE — ED Notes (Signed)
paplitations and sob x 1 week went to dr today and was sent here for further was given new bp med last week

## 2012-07-07 NOTE — Discharge Instructions (Signed)
 Orthostatic Hypotension Orthostatic hypotension is a sudden fall in blood pressure. It occurs when a person goes from a sitting or lying position to a standing position. CAUSES   Loss of body fluids (dehydration).   Medicines that lower blood pressure.   Sudden changes in posture, such as sudden standing when you have been sitting or lying down.   Taking too much of your medicine.  SYMPTOMS   Lightheadedness or dizziness.   Fainting or near-fainting.   A fast heart rate (tachycardia).   Weakness.   Feeling tired (fatigue).  DIAGNOSIS  Your caregiver may find the cause of orthostatic hypotension through:  A history and/or physical exam.   Checking your blood pressure. Your caregiver will check your blood pressure when you are:   Lying down.   Sitting.   Standing.   Tilt table testing. In this test, you are placed on a table that goes from a lying position to a standing position. You will be strapped to the table. This test helps to monitor your blood pressure and heart rate when you are in different positions.  TREATMENT   If orthostatic hypotension is caused by your medicines, your caregiver will need to adjust your dosage. Do not stop or adjust your medicine on your own.   When changing positions, make these changes slowly. This allows your body to adjust to the different position.   Compression stockings that are worn on your lower legs may be helpful.   Your caregiver may have you consume extra salt. Do not add extra salt to your diet unless directed by your caregiver.   Eat frequent, small meals. Avoid sudden standing after eating.   Avoid hot showers or excessive heat.   Your caregiver may give you fluids through the vein (intravenous).   Your caregiver may put you on medicine to help enhance fluid retention.  SEEK IMMEDIATE MEDICAL CARE IF:   You faint or have a near-fainting episode. Call your local emergency services (911 in [place]   You have or  develop chest pain.   You feel sick to your stomach (nauseous) or vomit.   You have a loss of feeling or movement in your arms or legs.   You have difficulty talking, slurred speech, or you are unable to talk.   You have difficulty thinking or have confused thinking.  MAKE SURE YOU:   Understand these instructions.   Will watch your condition.   Will get help right away if you are not doing well or get worse.  Document Released: 11/16/2002 Document Revised: 11/15/2011 Document Reviewed: 03/11/2009 [facility] Patient Information 2012 [place], [place].     Please stop your blood pressure medications completely, drink plenty of fluids over the next 3 days and continue to have your blood pressure checked daily to keep a log of what they have been to report back to your physicians.  Expect a phone call from the clinic with a follow up appointment.  If you do not hear from them in 1-2 days, please contact them to verify an appointment has been made.

## 2012-07-07 NOTE — Telephone Encounter (Signed)
Per Dr. Drue Second the patient will need to go to the Emergency Department. I called the patient and she said she would go today.

## 2012-07-07 NOTE — ED Notes (Signed)
Pt was seen at PCP for palpatations and SOB X2.  Pt has been on new hypertension meds and MD thinks that may be cause but wanted her to come for further eval.  Pt reports tingling fingers occacionally and feels dizzy when standing.  Pt reports uncomfortable feeling in chest but denies pain.  Pt alert oriented X4

## 2012-07-07 NOTE — Telephone Encounter (Signed)
Patient having palpitations and labored breathing on the new BP medication HCTZ. She was switched on 07/02/2012 due to dizziness from another BP medication. Please advise

## 2012-07-09 ENCOUNTER — Telehealth: Payer: Self-pay | Admitting: Licensed Clinical Social Worker

## 2012-07-09 NOTE — Telephone Encounter (Signed)
Patient went to the ED and was taken off blood pressure medication due to hypotension and palpations. She states she feels so much better and her blood pressures for the past two days have been 130/79 and 130/83. She prefers not to take anything right now, she will monitor her blood pressure at home. She will follow up at her schedule visit in September.

## 2012-08-08 ENCOUNTER — Ambulatory Visit (INDEPENDENT_AMBULATORY_CARE_PROVIDER_SITE_OTHER): Payer: Self-pay | Admitting: *Deleted

## 2012-08-08 DIAGNOSIS — Z1231 Encounter for screening mammogram for malignant neoplasm of breast: Secondary | ICD-10-CM

## 2012-08-08 DIAGNOSIS — Z0000 Encounter for general adult medical examination without abnormal findings: Secondary | ICD-10-CM

## 2012-08-08 DIAGNOSIS — Z124 Encounter for screening for malignant neoplasm of cervix: Secondary | ICD-10-CM

## 2012-08-08 NOTE — Patient Instructions (Addendum)
Your results will be ready in about a week.  I will mail them to you.  Thank you for coming to the Center for your care.  Ned Kakar,  RN 

## 2012-08-08 NOTE — Progress Notes (Signed)
  Subjective:     Tina Olson is a 66 y.o. woman who comes in today for a  pap smear only.  Previous abnormal Pap smears: no. Contraception: menopausal  Objective:    There were no vitals taken for this visit. Pelvic Exam: Pap smear obtained.   Assessment:    Screening pap smear.   Plan:    Follow up in one year, or as indicated by Pap results.  Pt given educational materials re: HIV and women, BSE, PAP smears,, heart disease and HIV, self-esteem and partner safety. Pt given condoms.

## 2012-08-15 ENCOUNTER — Encounter: Payer: Self-pay | Admitting: *Deleted

## 2012-09-01 ENCOUNTER — Ambulatory Visit (HOSPITAL_COMMUNITY): Payer: Self-pay

## 2012-09-03 ENCOUNTER — Other Ambulatory Visit: Payer: Self-pay

## 2012-09-03 ENCOUNTER — Ambulatory Visit: Payer: Self-pay

## 2012-09-03 DIAGNOSIS — B20 Human immunodeficiency virus [HIV] disease: Secondary | ICD-10-CM

## 2012-09-03 DIAGNOSIS — B1710 Acute hepatitis C without hepatic coma: Secondary | ICD-10-CM

## 2012-09-03 DIAGNOSIS — Z113 Encounter for screening for infections with a predominantly sexual mode of transmission: Secondary | ICD-10-CM

## 2012-09-03 LAB — COMPREHENSIVE METABOLIC PANEL
Albumin: 3.9 g/dL (ref 3.5–5.2)
Alkaline Phosphatase: 54 U/L (ref 39–117)
BUN: 20 mg/dL (ref 6–23)
Creat: 1.09 mg/dL (ref 0.50–1.10)
Glucose, Bld: 86 mg/dL (ref 70–99)
Total Bilirubin: 0.7 mg/dL (ref 0.3–1.2)

## 2012-09-03 LAB — CBC
HCT: 37.6 % (ref 36.0–46.0)
Hemoglobin: 12.7 g/dL (ref 12.0–15.0)
MCH: 27.2 pg (ref 26.0–34.0)
MCHC: 33.8 g/dL (ref 30.0–36.0)
MCV: 80.5 fL (ref 78.0–100.0)

## 2012-09-04 LAB — T-HELPER CELL (CD4) - (RCID CLINIC ONLY): CD4 % Helper T Cell: 40 % (ref 33–55)

## 2012-09-08 ENCOUNTER — Ambulatory Visit: Payer: Self-pay

## 2012-09-17 ENCOUNTER — Ambulatory Visit: Payer: Self-pay | Admitting: Infectious Diseases

## 2012-09-17 ENCOUNTER — Ambulatory Visit (HOSPITAL_COMMUNITY): Payer: Self-pay

## 2012-09-18 ENCOUNTER — Ambulatory Visit (INDEPENDENT_AMBULATORY_CARE_PROVIDER_SITE_OTHER): Payer: Self-pay | Admitting: Infectious Diseases

## 2012-09-18 ENCOUNTER — Encounter: Payer: Self-pay | Admitting: Infectious Diseases

## 2012-09-18 ENCOUNTER — Ambulatory Visit (HOSPITAL_COMMUNITY): Payer: Self-pay

## 2012-09-18 VITALS — BP 143/75 | HR 60 | Temp 98.5°F | Ht 61.0 in | Wt 134.0 lb

## 2012-09-18 DIAGNOSIS — I10 Essential (primary) hypertension: Secondary | ICD-10-CM

## 2012-09-18 DIAGNOSIS — Z23 Encounter for immunization: Secondary | ICD-10-CM

## 2012-09-18 DIAGNOSIS — B20 Human immunodeficiency virus [HIV] disease: Secondary | ICD-10-CM

## 2012-09-18 DIAGNOSIS — Z113 Encounter for screening for infections with a predominantly sexual mode of transmission: Secondary | ICD-10-CM

## 2012-09-18 DIAGNOSIS — Z79899 Other long term (current) drug therapy: Secondary | ICD-10-CM

## 2012-09-18 NOTE — Progress Notes (Signed)
  Subjective:    Patient ID: Tina Olson, female    DOB: November 15, 1946, 66 y.o.   MRN: [id number]  HPI 66 yo F with hx of HIV+, Hep C, HTN. ? Elite controller.  Was seen in August for pre-syncopal episodes. Had her anti-htn rx changed, is now off all meds. She feels much better. She is taking her BP at home with her own machines, states it is fair most days. States usually similar to today's measurement, usually lower.  Taking only ASA, only occasionally.   HIV 1 RNA Quant (copies/mL)  Date Value  09/03/2012 37*  02/04/2012 98*  09/24/2011 88*     CD4 T Cell Abs (cmm)  Date Value  09/03/2012 760   02/04/2012 760   09/24/2011 780    occas had numbness in her LLE, none recently. Hs of sciatic nerve problem.  Normal PAP 08-08-12. Mammo scheduled next month.    Review of Systems  Constitutional: Negative for appetite change and unexpected weight change.  Respiratory: Negative for cough and shortness of breath.   Cardiovascular: Negative for chest pain.  Gastrointestinal: Negative for diarrhea and constipation.  Genitourinary: Negative for dysuria.  Neurological: Negative for headaches.       Objective:   Physical Exam  Constitutional: She appears well-developed and well-nourished.  Eyes: EOM are normal. Pupils are equal, round, and reactive to light.  Neck: Neck supple.  Cardiovascular: Normal rate, regular rhythm and normal heart sounds.   Pulmonary/Chest: Effort normal and breath sounds normal.  Abdominal: Soft. Bowel sounds are normal. There is no tenderness. There is no rebound.  Musculoskeletal: She exhibits no edema.  Lymphadenopathy:    She has no cervical adenopathy.          Assessment & Plan:

## 2012-09-18 NOTE — Assessment & Plan Note (Signed)
She is doing very well, off meds. Will continue to watch. Her health maintenance is up to date ( had colonoscopy 5 years ago with dr Corinda Gubler, had PAP this year, mammo upcoming). She is given condoms, gets flu shot. Will see her back in 6 months.

## 2012-09-18 NOTE — Assessment & Plan Note (Signed)
Cont to follow LFTs, has gotten Hep A series

## 2012-09-18 NOTE — Assessment & Plan Note (Signed)
Appears to be doing well without medications.

## 2012-09-26 ENCOUNTER — Ambulatory Visit (HOSPITAL_COMMUNITY)
Admission: RE | Admit: 2012-09-26 | Discharge: 2012-09-26 | Disposition: A | Discharge status: Home / Self Care | Payer: Self-pay | Source: Ambulatory Visit | Attending: Infectious Diseases | Admitting: Infectious Diseases

## 2012-09-26 DIAGNOSIS — Z1231 Encounter for screening mammogram for malignant neoplasm of breast: Secondary | ICD-10-CM | POA: Insufficient documentation

## 2013-02-04 ENCOUNTER — Telehealth: Payer: Self-pay | Admitting: *Deleted

## 2013-02-04 NOTE — Telephone Encounter (Signed)
Message left.  Needs PAP smear appt.

## 2013-03-18 ENCOUNTER — Other Ambulatory Visit (INDEPENDENT_AMBULATORY_CARE_PROVIDER_SITE_OTHER): Payer: Medicare Other

## 2013-03-18 ENCOUNTER — Other Ambulatory Visit: Payer: Self-pay | Admitting: Infectious Diseases

## 2013-03-18 DIAGNOSIS — Z113 Encounter for screening for infections with a predominantly sexual mode of transmission: Secondary | ICD-10-CM

## 2013-03-18 DIAGNOSIS — Z79899 Other long term (current) drug therapy: Secondary | ICD-10-CM

## 2013-03-18 DIAGNOSIS — B20 Human immunodeficiency virus [HIV] disease: Secondary | ICD-10-CM

## 2013-03-18 LAB — CBC
HCT: 38.2 % (ref 36.0–46.0)
Hemoglobin: 12.8 g/dL (ref 12.0–15.0)
MCHC: 33.5 g/dL (ref 30.0–36.0)
MCV: 79.7 fL (ref 78.0–100.0)

## 2013-03-18 LAB — COMPREHENSIVE METABOLIC PANEL
Albumin: 4.1 g/dL (ref 3.5–5.2)
Alkaline Phosphatase: 53 U/L (ref 39–117)
BUN: 13 mg/dL (ref 6–23)
Calcium: 9.9 mg/dL (ref 8.4–10.5)
Creat: 0.9 mg/dL (ref 0.50–1.10)
Glucose, Bld: 110 mg/dL — ABNORMAL HIGH (ref 70–99)
Potassium: 3.8 mEq/L (ref 3.5–5.3)

## 2013-03-18 LAB — LIPID PANEL
HDL: 55 mg/dL (ref >39)
LDL Cholesterol: 82 mg/dL (ref 0–99)
Total CHOL/HDL Ratio: 2.9 Ratio
Triglycerides: 111 mg/dL (ref <150)

## 2013-03-20 LAB — HIV-1 RNA QUANT-NO REFLEX-BLD: HIV-1 RNA Quant, Log: 1.91 {Log} — ABNORMAL HIGH (ref <1.30)

## 2013-04-01 ENCOUNTER — Ambulatory Visit (INDEPENDENT_AMBULATORY_CARE_PROVIDER_SITE_OTHER): Payer: Medicare Other | Admitting: Infectious Diseases

## 2013-04-01 ENCOUNTER — Encounter: Payer: Self-pay | Admitting: Infectious Diseases

## 2013-04-01 VITALS — BP 158/90 | HR 52 | Temp 98.9°F | Ht 62.0 in | Wt 135.0 lb

## 2013-04-01 DIAGNOSIS — B20 Human immunodeficiency virus [HIV] disease: Secondary | ICD-10-CM

## 2013-04-01 DIAGNOSIS — B1710 Acute hepatitis C without hepatic coma: Secondary | ICD-10-CM

## 2013-04-01 DIAGNOSIS — I10 Essential (primary) hypertension: Secondary | ICD-10-CM

## 2013-04-01 DIAGNOSIS — Z23 Encounter for immunization: Secondary | ICD-10-CM

## 2013-04-01 NOTE — Addendum Note (Signed)
Addended by: Andree Coss on: 04/01/2013 11:15 AM   Modules accepted: Orders

## 2013-04-01 NOTE — Assessment & Plan Note (Signed)
She is hypertensive today, but asx. When this is mentioned to her, she is quite clear that she does NOT want to be on anti-htn rx. She can have this addressed by PCP as well.

## 2013-04-01 NOTE — Assessment & Plan Note (Signed)
Gets PNVx booster today, her last. She needs PAP in August. She will get established with PCP. Will continue to watch her off ART. rtc 6 months.

## 2013-04-01 NOTE — Progress Notes (Signed)
  Subjective:    Patient ID: Tina Olson, female    DOB: Nov 29, 1946, 67 y.o.   MRN: [id number]  HPI 67 yo F with hx of HIV+, Hep C, HTN. ? Elite controller.   HIV 1 RNA Quant (copies/mL)  Date Value  03/18/2013 81*  09/03/2012 37*  02/04/2012 98*     CD4 T Cell Abs (cmm)  Date Value  03/18/2013 660   09/03/2012 760   02/04/2012 760     Has been doing well. BP slightly elevated, does not want anti-htn rx. Attributes it to doing a lot of running this AM.   Review of Systems  Constitutional: Negative for appetite change and unexpected weight change.  Cardiovascular: Negative for chest pain.  Gastrointestinal: Positive for constipation. Negative for diarrhea.       Occas miralax  Genitourinary: Negative for difficulty urinating.  Neurological: Positive for numbness. Negative for dizziness and headaches.       Occas R leg numbness, attributes to sciatic nerve problems.        Objective:   Physical Exam  Constitutional: She appears well-developed and well-nourished.  HENT:  Mouth/Throat: No oropharyngeal exudate.  Eyes: EOM are normal. Pupils are equal, round, and reactive to light.  Neck: Neck supple.  Cardiovascular: Normal rate, regular rhythm and normal heart sounds.   Pulmonary/Chest: Effort normal and breath sounds normal.  Abdominal: Soft. Bowel sounds are normal. There is no tenderness. There is no rebound.  Musculoskeletal: She exhibits no edema.  Lymphadenopathy:    She has no cervical adenopathy.          Assessment & Plan:

## 2013-04-01 NOTE — Assessment & Plan Note (Signed)
Last LFTs normal. Has been vax for Hep A/B. Will check titers.

## 2013-08-03 ENCOUNTER — Other Ambulatory Visit (HOSPITAL_COMMUNITY)
Admission: RE | Admit: 2013-08-03 | Discharge: 2013-08-03 | Disposition: A | Discharge status: Home / Self Care | Payer: Medicare Other | Source: Ambulatory Visit | Attending: Infectious Diseases | Admitting: Infectious Diseases

## 2013-08-03 ENCOUNTER — Ambulatory Visit (INDEPENDENT_AMBULATORY_CARE_PROVIDER_SITE_OTHER): Payer: Medicare Other | Admitting: *Deleted

## 2013-08-03 DIAGNOSIS — Z1231 Encounter for screening mammogram for malignant neoplasm of breast: Secondary | ICD-10-CM

## 2013-08-03 DIAGNOSIS — Z124 Encounter for screening for malignant neoplasm of cervix: Secondary | ICD-10-CM | POA: Insufficient documentation

## 2013-08-03 DIAGNOSIS — Z23 Encounter for immunization: Secondary | ICD-10-CM

## 2013-08-03 NOTE — Progress Notes (Signed)
  Subjective:     Tina Olson is a 67 y.o. woman who comes in today for a  pap smear only.  Previous abnormal Pap smears: no. Contraception: condoms  Objective:    There were no vitals taken for this visit. Pelvic Exam:  Pap smear obtained.   Assessment:    Screening pap smear.   Plan:    Follow up in one year, or as indicated by Pap results.  Pt given educational materials re: HIV and diet, nutrition, exercise, BSE, health promotion, self-esteem, partner protection and PAP smears. Given condoms.

## 2013-08-03 NOTE — Patient Instructions (Addendum)
Your results will be ready in about a week.  I will mail them to you.  Thank you for coming to the Center for your care.  Tailer Volkert,  RN 

## 2013-08-06 ENCOUNTER — Encounter: Payer: Self-pay | Admitting: *Deleted

## 2013-09-30 ENCOUNTER — Other Ambulatory Visit: Payer: Medicare Other

## 2013-09-30 DIAGNOSIS — B1710 Acute hepatitis C without hepatic coma: Secondary | ICD-10-CM

## 2013-09-30 DIAGNOSIS — R5381 Other malaise: Secondary | ICD-10-CM

## 2013-09-30 DIAGNOSIS — B20 Human immunodeficiency virus [HIV] disease: Secondary | ICD-10-CM

## 2013-09-30 LAB — COMPREHENSIVE METABOLIC PANEL
Albumin: 3.8 g/dL (ref 3.5–5.2)
BUN: 15 mg/dL (ref 6–23)
CO2: 20 mEq/L (ref 19–32)
Calcium: 9.5 mg/dL (ref 8.4–10.5)
Chloride: 108 mEq/L (ref 96–112)
Glucose, Bld: 106 mg/dL — ABNORMAL HIGH (ref 70–99)
Potassium: 4.1 mEq/L (ref 3.5–5.3)

## 2013-09-30 LAB — CBC
HCT: 34.8 % — ABNORMAL LOW (ref 36.0–46.0)
Hemoglobin: 11.8 g/dL — ABNORMAL LOW (ref 12.0–15.0)
WBC: 3.6 10*3/uL — ABNORMAL LOW (ref 4.0–10.5)

## 2013-09-30 LAB — TSH: TSH: 1.797 u[IU]/mL (ref 0.350–4.500)

## 2013-10-01 LAB — HIV-1 RNA QUANT-NO REFLEX-BLD: HIV-1 RNA Quant, Log: 1.75 {Log} — ABNORMAL HIGH (ref <1.30)

## 2013-10-01 LAB — HEPATITIS A ANTIBODY, TOTAL: Hep A Total Ab: REACTIVE — AB

## 2013-10-01 LAB — HEPATITIS B SURFACE ANTIBODY,QUALITATIVE: Hep B S Ab: NEGATIVE

## 2013-10-14 ENCOUNTER — Encounter: Payer: Self-pay | Admitting: Infectious Diseases

## 2013-10-14 ENCOUNTER — Ambulatory Visit (INDEPENDENT_AMBULATORY_CARE_PROVIDER_SITE_OTHER): Payer: Medicare Other | Admitting: Infectious Diseases

## 2013-10-14 VITALS — BP 163/84 | HR 58 | Temp 97.8°F | Ht 60.0 in | Wt 137.0 lb

## 2013-10-14 DIAGNOSIS — Z23 Encounter for immunization: Secondary | ICD-10-CM

## 2013-10-14 DIAGNOSIS — I10 Essential (primary) hypertension: Secondary | ICD-10-CM

## 2013-10-14 DIAGNOSIS — Z79899 Other long term (current) drug therapy: Secondary | ICD-10-CM

## 2013-10-14 DIAGNOSIS — Z113 Encounter for screening for infections with a predominantly sexual mode of transmission: Secondary | ICD-10-CM

## 2013-10-14 DIAGNOSIS — B1710 Acute hepatitis C without hepatic coma: Secondary | ICD-10-CM

## 2013-10-14 DIAGNOSIS — B20 Human immunodeficiency virus [HIV] disease: Secondary | ICD-10-CM

## 2013-10-14 NOTE — Assessment & Plan Note (Signed)
Will restart her Hep B series. Will recheck her Hep C RNA with next blood draw, she thinks she got shots for this in the past.  Will f/u at her return in 6 months.

## 2013-10-14 NOTE — Addendum Note (Signed)
Addended by: Andree Coss on: 10/14/2013 12:20 PM   Modules accepted: Orders

## 2013-10-14 NOTE — Assessment & Plan Note (Signed)
Being treated by PCP. Greatly appreciate their help.

## 2013-10-14 NOTE — Assessment & Plan Note (Signed)
She is doing very well off therapy. Will continue to watch. She has gotten flu shot. PAP normal. Offered/refused condoms. rtc 6 months

## 2013-10-14 NOTE — Progress Notes (Signed)
  Subjective:    Patient ID: Tina Olson, female    DOB: 1946-05-01, 67 y.o.   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 She had normal PAP August 2014. Hep A Ab+, Hep B SAb- at last blood draw.  Was seen by PCP, put on HCTZ. Taking qod due to issues with hypotension.  Has not been seen by hepatology recently. No pale stools, no chang ein urine color, no icterus.  Going to ophtho, reading vision has been getting blurry. R > L.   HIV 1 RNA Quant (copies/mL)  Date Value  09/30/2013 56*  03/18/2013 81*  09/03/2012 37*     CD4 T Cell Abs (/uL)  Date Value  09/30/2013 870   03/18/2013 660   09/03/2012 760    Started taking allergy pills for rhinorrhea.   Review of Systems  Constitutional: Negative for fever, chills, appetite change and unexpected weight change.  HENT: Positive for rhinorrhea.   Respiratory: Negative for shortness of breath.   Gastrointestinal: Negative for diarrhea and constipation.  Genitourinary: Negative for difficulty urinating.       Objective:   Physical Exam  Constitutional: She appears well-developed and well-nourished.  HENT:  Mouth/Throat: No oropharyngeal exudate.  Eyes: EOM are normal. Pupils are equal, round, and reactive to light.  Neck: Neck supple.  Cardiovascular: Normal rate, regular rhythm and normal heart sounds.   Pulmonary/Chest: Effort normal and breath sounds normal.  Abdominal: Soft. Bowel sounds are normal. There is no tenderness.  Musculoskeletal: She exhibits no edema.  Lymphadenopathy:    She has no cervical adenopathy.          Assessment & Plan:

## 2013-11-13 ENCOUNTER — Ambulatory Visit (INDEPENDENT_AMBULATORY_CARE_PROVIDER_SITE_OTHER): Payer: Medicare Other | Admitting: Licensed Clinical Social Worker

## 2013-11-13 DIAGNOSIS — Z23 Encounter for immunization: Secondary | ICD-10-CM

## 2013-11-13 DIAGNOSIS — B20 Human immunodeficiency virus [HIV] disease: Secondary | ICD-10-CM

## 2014-03-31 ENCOUNTER — Other Ambulatory Visit: Payer: Medicare Other

## 2014-03-31 DIAGNOSIS — Z113 Encounter for screening for infections with a predominantly sexual mode of transmission: Secondary | ICD-10-CM

## 2014-03-31 DIAGNOSIS — B1710 Acute hepatitis C without hepatic coma: Secondary | ICD-10-CM

## 2014-03-31 DIAGNOSIS — Z79899 Other long term (current) drug therapy: Secondary | ICD-10-CM

## 2014-03-31 DIAGNOSIS — B20 Human immunodeficiency virus [HIV] disease: Secondary | ICD-10-CM

## 2014-03-31 LAB — CBC WITH DIFFERENTIAL/PLATELET
BASOS PCT: 0 % (ref 0–1)
Basophils Absolute: 0 10*3/uL (ref 0.0–0.1)
EOS ABS: 0 10*3/uL (ref 0.0–0.7)
EOS PCT: 1 % (ref 0–5)
HCT: 34.4 % — ABNORMAL LOW (ref 36.0–46.0)
HEMOGLOBIN: 11.7 g/dL — AB (ref 12.0–15.0)
LYMPHS ABS: 2 10*3/uL (ref 0.7–4.0)
Lymphocytes Relative: 52 % — ABNORMAL HIGH (ref 12–46)
MCH: 26.1 pg (ref 26.0–34.0)
MCHC: 34 g/dL (ref 30.0–36.0)
MCV: 76.8 fL — AB (ref 78.0–100.0)
MONOS PCT: 10 % (ref 3–12)
Monocytes Absolute: 0.4 10*3/uL (ref 0.1–1.0)
NEUTROS PCT: 37 % — AB (ref 43–77)
Neutro Abs: 1.4 10*3/uL — ABNORMAL LOW (ref 1.7–7.7)
Platelets: 147 10*3/uL — ABNORMAL LOW (ref 150–400)
RBC: 4.48 MIL/uL (ref 3.87–5.11)
RDW: 14.4 % (ref 11.5–15.5)
WBC: 3.9 10*3/uL — ABNORMAL LOW (ref 4.0–10.5)

## 2014-03-31 LAB — LIPID PANEL
Cholesterol: 182 mg/dL (ref 0–200)
HDL: 47 mg/dL (ref >39)
LDL Cholesterol: 109 mg/dL — ABNORMAL HIGH (ref 0–99)
TRIGLYCERIDES: 131 mg/dL (ref <150)
Total CHOL/HDL Ratio: 3.9 Ratio
VLDL: 26 mg/dL (ref 0–40)

## 2014-03-31 LAB — COMPLETE METABOLIC PANEL WITH GFR
ALBUMIN: 4 g/dL (ref 3.5–5.2)
ALK PHOS: 60 U/L (ref 39–117)
ALT: 25 U/L (ref 0–35)
AST: 31 U/L (ref 0–37)
BUN: 16 mg/dL (ref 6–23)
CALCIUM: 9.8 mg/dL (ref 8.4–10.5)
CHLORIDE: 102 meq/L (ref 96–112)
CO2: 24 mEq/L (ref 19–32)
Creat: 0.84 mg/dL (ref 0.50–1.10)
GFR, EST NON AFRICAN AMERICAN: 73 mL/min
GFR, Est African American: 84 mL/min
GLUCOSE: 118 mg/dL — AB (ref 70–99)
POTASSIUM: 3.5 meq/L (ref 3.5–5.3)
SODIUM: 135 meq/L (ref 135–145)
TOTAL PROTEIN: 8 g/dL (ref 6.0–8.3)
Total Bilirubin: 0.9 mg/dL (ref 0.2–1.2)

## 2014-04-01 LAB — T-HELPER CELL (CD4) - (RCID CLINIC ONLY)
CD4 % Helper T Cell: 37 % (ref 33–55)
CD4 T Cell Abs: 680 /uL (ref 400–2700)

## 2014-04-01 LAB — RPR

## 2014-04-02 LAB — HIV-1 RNA QUANT-NO REFLEX-BLD
HIV 1 RNA Quant: 326 copies/mL — ABNORMAL HIGH (ref <20)
HIV-1 RNA Quant, Log: 2.51 {Log} — ABNORMAL HIGH (ref <1.30)

## 2014-04-02 LAB — HEPATITIS C RNA QUANTITATIVE
HCV QUANT: 6849094 [IU]/mL — AB (ref <15)
HCV Quantitative Log: 6.84 {Log} — ABNORMAL HIGH (ref <1.18)

## 2014-04-14 ENCOUNTER — Ambulatory Visit: Payer: Medicare Other | Admitting: Infectious Diseases

## 2014-05-17 ENCOUNTER — Ambulatory Visit (INDEPENDENT_AMBULATORY_CARE_PROVIDER_SITE_OTHER): Payer: Medicare Other | Admitting: Infectious Diseases

## 2014-05-17 ENCOUNTER — Encounter: Payer: Self-pay | Admitting: Infectious Diseases

## 2014-05-17 VITALS — BP 135/81 | HR 61 | Temp 98.4°F | Ht 61.5 in | Wt 137.0 lb

## 2014-05-17 DIAGNOSIS — B20 Human immunodeficiency virus [HIV] disease: Secondary | ICD-10-CM

## 2014-05-17 DIAGNOSIS — B1710 Acute hepatitis C without hepatic coma: Secondary | ICD-10-CM

## 2014-05-17 DIAGNOSIS — N63 Unspecified lump in unspecified breast: Secondary | ICD-10-CM

## 2014-05-17 DIAGNOSIS — Z23 Encounter for immunization: Secondary | ICD-10-CM

## 2014-05-17 LAB — IRON: IRON: 71 ug/dL (ref 42–145)

## 2014-05-17 NOTE — Addendum Note (Signed)
Addended by: Mariea Clonts D on: 05/17/2014 03:58 PM   Modules accepted: Orders

## 2014-05-17 NOTE — Progress Notes (Signed)
   Subjective:    Patient ID: Tina Olson, female    DOB: 03/31/1946, 69 y.o.   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 She had normal PAP August 2014. Hep A Ab+, Hep B SAb- at last blood draw, restarted Hep B series.   HIV 1 RNA Quant (copies/mL)  Date Value  03/31/2014 326*  09/30/2013 56*  03/18/2013 81*     CD4 T Cell Abs (/uL)  Date Value  03/31/2014 680   09/30/2013 870   03/18/2013 660    Was seen by ophtho and was told she was getting cataracts, no HIV related changes.  No pale or chalky BM, no nose or gum bleeds.  PAP normal 07-2013 Review of Systems  Constitutional: Negative for fever, chills, appetite change and unexpected weight change.  Gastrointestinal: Negative for diarrhea and constipation.  Genitourinary: Negative for difficulty urinating.       Objective:   Physical Exam  Constitutional: She appears well-developed and well-nourished.  HENT:  Mouth/Throat: No oropharyngeal exudate.  Eyes: EOM are normal. Pupils are equal, round, and reactive to light.  Neck: Neck supple.  Cardiovascular: Normal rate, regular rhythm and normal heart sounds.   Pulmonary/Chest: Effort normal and breath sounds normal.  Abdominal: Soft. Bowel sounds are normal. She exhibits no distension. There is no tenderness.  Musculoskeletal: She exhibits no edema.  Lymphadenopathy:    She has no cervical adenopathy.          Assessment & Plan:

## 2014-05-17 NOTE — Assessment & Plan Note (Signed)
Needs repeat Mammo

## 2014-05-17 NOTE — Assessment & Plan Note (Signed)
She is doing very well as an elite controller, off ART. Will recheck her RNA today.  Will see her back in 6 months with labs.

## 2014-05-17 NOTE — Assessment & Plan Note (Signed)
Will complete her screening labs. Get elastography. Get pharm to assist.

## 2014-05-18 LAB — PROTIME-INR
INR: 1.04 (ref <1.50)
Prothrombin Time: 13.5 s (ref 11.6–15.2)

## 2014-05-18 LAB — ANA: Anti Nuclear Antibody(ANA): NEGATIVE

## 2014-05-18 LAB — HEPATITIS A ANTIBODY, TOTAL: Hep A Total Ab: REACTIVE — AB

## 2014-05-19 LAB — HCV RNA QUANT RFLX ULTRA OR GENOTYP
HCV QUANT: 6494488 [IU]/mL — AB (ref <15)
HCV Quantitative Log: 6.81 {Log} — ABNORMAL HIGH (ref <1.18)

## 2014-05-20 ENCOUNTER — Ambulatory Visit (HOSPITAL_COMMUNITY)
Admission: RE | Admit: 2014-05-20 | Discharge: 2014-05-20 | Disposition: A | Discharge status: Home / Self Care | Payer: Medicare Other | Source: Ambulatory Visit | Attending: Infectious Diseases | Admitting: Infectious Diseases

## 2014-05-20 DIAGNOSIS — I10 Essential (primary) hypertension: Secondary | ICD-10-CM | POA: Insufficient documentation

## 2014-05-20 DIAGNOSIS — B1710 Acute hepatitis C without hepatic coma: Secondary | ICD-10-CM

## 2014-05-21 LAB — HEPATITIS C GENOTYPE

## 2014-05-25 ENCOUNTER — Ambulatory Visit: Payer: Medicare Other

## 2014-06-04 ENCOUNTER — Ambulatory Visit
Admission: RE | Admit: 2014-06-04 | Discharge: 2014-06-04 | Disposition: A | Discharge status: Home / Self Care | Payer: Medicare Other | Source: Ambulatory Visit | Attending: Infectious Diseases | Admitting: Infectious Diseases

## 2014-06-04 DIAGNOSIS — Z1231 Encounter for screening mammogram for malignant neoplasm of breast: Secondary | ICD-10-CM

## 2014-06-23 ENCOUNTER — Other Ambulatory Visit: Payer: Self-pay | Admitting: Infectious Diseases

## 2014-06-23 MED ORDER — LEDIPASVIR-SOFOSBUVIR 90-400 MG PO TABS: 1.0000 | ORAL_TABLET | Freq: Every day | ORAL | Status: DC

## 2014-06-29 ENCOUNTER — Telehealth: Payer: Self-pay

## 2014-06-29 NOTE — Telephone Encounter (Signed)
Patient called regarding receiving letter stating Harvoni was approved. She was not aware Dr [name] was planning on treating her Hepatitis C or aware of ultrasound results.   She is not scheduled to return until October 2015.    Our pharmacist, Ulyses SouthwardMinh Pham is  aware she has been approved.    I will forward this note to Dr [name] to determine next step.   Message left on voice mail for patient stating Dr Moshe CiproHatcher"s intention to treat Hep. C and schedule return visit to discuss.   Laurell Josephsammy K Mizraim Harmening, RN

## 2014-06-29 NOTE — Telephone Encounter (Signed)
Pt can come in for visit to discuss

## 2014-07-06 ENCOUNTER — Other Ambulatory Visit: Payer: Self-pay | Admitting: Pharmacist Clinician (PhC)/ Clinical Pharmacy Specialist

## 2014-07-10 DIAGNOSIS — B1920 Unspecified viral hepatitis C without hepatic coma: Secondary | ICD-10-CM

## 2014-07-10 HISTORY — DX: Unspecified viral hepatitis C without hepatic coma: B19.20

## 2014-07-22 ENCOUNTER — Telehealth: Payer: Self-pay | Admitting: *Deleted

## 2014-07-22 NOTE — Telephone Encounter (Signed)
Patient called stating she is experiencing stomach upset and low appetite since starting Harvoni on 07/01/14. She is not vomiting and continues to take it but was requesting sooner appointment. Appt changed from 08/03/14 to 07/26/14. Patient encouraged to continue medication until she sees MD. She was taking fish oil and dan active shakes and plans to stop that until she speaks to Dr. [name]. Wendall MolaJacqueline Diany Formosa

## 2014-07-26 ENCOUNTER — Ambulatory Visit (INDEPENDENT_AMBULATORY_CARE_PROVIDER_SITE_OTHER): Payer: Medicare Other | Admitting: Infectious Diseases

## 2014-07-26 ENCOUNTER — Encounter: Payer: Self-pay | Admitting: Infectious Diseases

## 2014-07-26 VITALS — BP 103/67 | HR 93 | Temp 98.3°F | Wt 130.0 lb

## 2014-07-26 DIAGNOSIS — B1710 Acute hepatitis C without hepatic coma: Secondary | ICD-10-CM

## 2014-07-26 DIAGNOSIS — B20 Human immunodeficiency virus [HIV] disease: Secondary | ICD-10-CM

## 2014-07-26 DIAGNOSIS — R112 Nausea with vomiting, unspecified: Secondary | ICD-10-CM

## 2014-07-26 DIAGNOSIS — I10 Essential (primary) hypertension: Secondary | ICD-10-CM

## 2014-07-26 MED ORDER — ONDANSETRON HCL 4 MG PO TABS: 4.0000 mg | ORAL_TABLET | Freq: Three times a day (TID) | ORAL | Status: DC | PRN

## 2014-07-26 NOTE — Assessment & Plan Note (Signed)
She appears to be doing well. Will recheck her VL and CD4 today. Hep C issues predominate.

## 2014-07-26 NOTE — Progress Notes (Signed)
   Subjective:    Patient ID: Tina Olson, female    DOB: 02/05/1946, 68 y.o.   MRN: [id number]  HPI 68 yo F with HIV+/elite controller (can't do study as she is a hard stick), HTN and Hepatitis C (1b)- not clear if treated before [facility]. She had normal PAP August 2014. Hep A Ab+, Hep B SAb- at last blood draw, restarted Hep B series.  Was started on harvoni after last visit for her Hep C (VL 6.5 million) and F0 elastography/ultrasound.  Today feels sick- had 1 episode of emesis. Has poor sense of smell, nauseated. Has been on harvoni and HCTZ only. Has lost 7-8 #. Has been on Harvoni since July 23. Denies missed doses.   HIV 1 RNA Quant (copies/mL)  Date Value  03/31/2014 326*  09/30/2013 56*  03/18/2013 81*     CD4 T Cell Abs (/uL)  Date Value  03/31/2014 680   09/30/2013 870   03/18/2013 660       Review of Systems  Constitutional: Positive for appetite change and unexpected weight change.  Cardiovascular: Negative for leg swelling.  Gastrointestinal: Positive for nausea and vomiting. Negative for abdominal pain and abdominal distention.       Objective:   Physical Exam  Constitutional: She appears well-developed and well-nourished.  HENT:  Mouth/Throat: No oropharyngeal exudate.  Eyes: EOM are normal. Pupils are equal, round, and reactive to light.  Neck: Neck supple.  Cardiovascular: Normal rate, regular rhythm and normal heart sounds.   Pulmonary/Chest: Effort normal and breath sounds normal.  Abdominal: Soft. Bowel sounds are normal. She exhibits no distension. There is no tenderness.  Musculoskeletal: She exhibits no edema.  Lymphadenopathy:    She has no cervical adenopathy.          Assessment & Plan:

## 2014-07-26 NOTE — Assessment & Plan Note (Signed)
She has stayed on her HCTZ. Question if she is dehydrated. Does not appear so clinically.

## 2014-07-26 NOTE — Assessment & Plan Note (Signed)
She wishes to try and stay on the meds. Will write her for zofran. Will check her labs. Will see her back in 1 month.

## 2014-07-29 ENCOUNTER — Other Ambulatory Visit (INDEPENDENT_AMBULATORY_CARE_PROVIDER_SITE_OTHER): Payer: Medicare Other

## 2014-07-29 DIAGNOSIS — B1710 Acute hepatitis C without hepatic coma: Secondary | ICD-10-CM

## 2014-08-02 ENCOUNTER — Other Ambulatory Visit: Payer: Self-pay | Admitting: Internal Medicine

## 2014-08-02 ENCOUNTER — Other Ambulatory Visit (INDEPENDENT_AMBULATORY_CARE_PROVIDER_SITE_OTHER): Payer: Medicare Other

## 2014-08-02 DIAGNOSIS — B20 Human immunodeficiency virus [HIV] disease: Secondary | ICD-10-CM

## 2014-08-02 DIAGNOSIS — B1710 Acute hepatitis C without hepatic coma: Secondary | ICD-10-CM

## 2014-08-02 LAB — COMPREHENSIVE METABOLIC PANEL
ALBUMIN: 3.4 g/dL — AB (ref 3.5–5.2)
ALK PHOS: 33 U/L — AB (ref 39–117)
ALT: 12 U/L (ref 0–35)
AST: 20 U/L (ref 0–37)
BUN: 16 mg/dL (ref 6–23)
CO2: 28 meq/L (ref 19–32)
Calcium: 9.3 mg/dL (ref 8.4–10.5)
Chloride: 100 mEq/L (ref 96–112)
Creat: 1.15 mg/dL — ABNORMAL HIGH (ref 0.50–1.10)
Glucose, Bld: 133 mg/dL — ABNORMAL HIGH (ref 70–99)
Potassium: 3.6 mEq/L (ref 3.5–5.3)
SODIUM: 136 meq/L (ref 135–145)
TOTAL PROTEIN: 6.3 g/dL (ref 6.0–8.3)
Total Bilirubin: 0.5 mg/dL (ref 0.2–1.2)

## 2014-08-02 LAB — LIPASE: Lipase: 31 U/L (ref 0–75)

## 2014-08-02 LAB — CBC WITH DIFFERENTIAL/PLATELET
BASOS PCT: 0 % (ref 0–1)
Basophils Absolute: 0 10*3/uL (ref 0.0–0.1)
EOS ABS: 0 10*3/uL (ref 0.0–0.7)
Eosinophils Relative: 1 % (ref 0–5)
HCT: 37 % (ref 36.0–46.0)
Hemoglobin: 12.5 g/dL (ref 12.0–15.0)
LYMPHS ABS: 1.6 10*3/uL (ref 0.7–4.0)
Lymphocytes Relative: 44 % (ref 12–46)
MCH: 26.5 pg (ref 26.0–34.0)
MCHC: 33.8 g/dL (ref 30.0–36.0)
MCV: 78.6 fL (ref 78.0–100.0)
Monocytes Absolute: 0.2 10*3/uL (ref 0.1–1.0)
Monocytes Relative: 6 % (ref 3–12)
NEUTROS ABS: 1.8 10*3/uL (ref 1.7–7.7)
Neutrophils Relative %: 49 % (ref 43–77)
Platelets: 202 10*3/uL (ref 150–400)
RBC: 4.71 MIL/uL (ref 3.87–5.11)
RDW: 15.7 % — ABNORMAL HIGH (ref 11.5–15.5)
WBC: 3.7 10*3/uL — ABNORMAL LOW (ref 4.0–10.5)

## 2014-08-02 LAB — AMYLASE: Amylase: 65 U/L (ref 0–105)

## 2014-08-03 ENCOUNTER — Ambulatory Visit: Payer: Medicare Other | Admitting: Infectious Diseases

## 2014-08-03 LAB — HIV-1 RNA QUANT-NO REFLEX-BLD
HIV 1 RNA QUANT: 476 {copies}/mL — AB (ref <20)
HIV-1 RNA Quant, Log: 2.68 {Log} — ABNORMAL HIGH (ref <1.30)

## 2014-08-10 ENCOUNTER — Telehealth: Payer: Self-pay | Admitting: Licensed Clinical Social Worker

## 2014-08-10 NOTE — Telephone Encounter (Signed)
Patient is still having nausea and vomiting after starting Harvoni and she states that her blood pressure has been low without her bp medications, she has experienced some dizziness also. Patient states that Zofran is not helping much, and she is holding off today on the Harvoni medication until she receives a call back from our office. Please advise

## 2014-08-11 ENCOUNTER — Other Ambulatory Visit: Payer: Self-pay | Admitting: Infectious Diseases

## 2014-08-11 DIAGNOSIS — B182 Chronic viral hepatitis C: Secondary | ICD-10-CM

## 2014-08-11 NOTE — Telephone Encounter (Signed)
Spoke to patient and she did not take the [facility] yesterday or today and is already feeling better. She thought Dr. Ninetta Lights had ordered a Hep C viral load and it was not done. Checked with lab and this may be able to be added. She is not going to continue the medication, but will keep her upcoming appointment in November. Wendall Mola

## 2014-08-11 NOTE — Telephone Encounter (Signed)
If pt is taking enough PO, water, food, and still feels dizzy and BP is low, she should stop harvoni.

## 2014-08-12 LAB — HEPATITIS C RNA QUANTITATIVE: HCV Quantitative: NOT DETECTED IU/mL (ref <15)

## 2014-08-16 ENCOUNTER — Emergency Department (HOSPITAL_COMMUNITY): Payer: Medicare Other

## 2014-08-16 ENCOUNTER — Emergency Department (HOSPITAL_COMMUNITY)
Admission: EM | Admit: 2014-08-16 | Discharge: 2014-08-16 | Disposition: A | Discharge status: Home / Self Care | Payer: Medicare Other | Attending: Emergency Medicine | Admitting: Emergency Medicine

## 2014-08-16 ENCOUNTER — Encounter (HOSPITAL_COMMUNITY): Payer: Self-pay | Admitting: Emergency Medicine

## 2014-08-16 DIAGNOSIS — I10 Essential (primary) hypertension: Secondary | ICD-10-CM | POA: Diagnosis not present

## 2014-08-16 DIAGNOSIS — Z87891 Personal history of nicotine dependence: Secondary | ICD-10-CM | POA: Insufficient documentation

## 2014-08-16 DIAGNOSIS — Z79899 Other long term (current) drug therapy: Secondary | ICD-10-CM | POA: Insufficient documentation

## 2014-08-16 DIAGNOSIS — N390 Urinary tract infection, site not specified: Secondary | ICD-10-CM | POA: Diagnosis not present

## 2014-08-16 DIAGNOSIS — R109 Unspecified abdominal pain: Secondary | ICD-10-CM | POA: Diagnosis not present

## 2014-08-16 DIAGNOSIS — R5381 Other malaise: Secondary | ICD-10-CM | POA: Insufficient documentation

## 2014-08-16 DIAGNOSIS — IMO0002 Reserved for concepts with insufficient information to code with codable children: Secondary | ICD-10-CM | POA: Insufficient documentation

## 2014-08-16 DIAGNOSIS — Z7982 Long term (current) use of aspirin: Secondary | ICD-10-CM | POA: Insufficient documentation

## 2014-08-16 DIAGNOSIS — R112 Nausea with vomiting, unspecified: Secondary | ICD-10-CM | POA: Insufficient documentation

## 2014-08-16 DIAGNOSIS — K5900 Constipation, unspecified: Secondary | ICD-10-CM | POA: Insufficient documentation

## 2014-08-16 DIAGNOSIS — R5383 Other fatigue: Secondary | ICD-10-CM

## 2014-08-16 DIAGNOSIS — Z8619 Personal history of other infectious and parasitic diseases: Secondary | ICD-10-CM | POA: Diagnosis not present

## 2014-08-16 DIAGNOSIS — R110 Nausea: Secondary | ICD-10-CM

## 2014-08-16 DIAGNOSIS — Z21 Asymptomatic human immunodeficiency virus [HIV] infection status: Secondary | ICD-10-CM | POA: Diagnosis not present

## 2014-08-16 LAB — URINALYSIS, ROUTINE W REFLEX MICROSCOPIC
Glucose, UA: NEGATIVE mg/dL
Hgb urine dipstick: NEGATIVE
Ketones, ur: 15 mg/dL — AB
NITRITE: NEGATIVE
Protein, ur: NEGATIVE mg/dL
SPECIFIC GRAVITY, URINE: 1.035 — AB (ref 1.005–1.030)
UROBILINOGEN UA: 1 mg/dL (ref 0.0–1.0)
pH: 6 (ref 5.0–8.0)

## 2014-08-16 LAB — COMPREHENSIVE METABOLIC PANEL
ALBUMIN: 2.7 g/dL — AB (ref 3.5–5.2)
ALK PHOS: 31 U/L — AB (ref 39–117)
ALT: 12 U/L (ref 0–35)
ANION GAP: 14 (ref 5–15)
AST: 22 U/L (ref 0–37)
BILIRUBIN TOTAL: 0.5 mg/dL (ref 0.3–1.2)
BUN: 16 mg/dL (ref 6–23)
CHLORIDE: 103 meq/L (ref 96–112)
CO2: 20 mEq/L (ref 19–32)
Calcium: 9 mg/dL (ref 8.4–10.5)
Creatinine, Ser: 0.94 mg/dL (ref 0.50–1.10)
GFR calc Af Amer: 72 mL/min — ABNORMAL LOW (ref >90)
GFR calc non Af Amer: 62 mL/min — ABNORMAL LOW (ref >90)
Glucose, Bld: 96 mg/dL (ref 70–99)
Potassium: 4.1 mEq/L (ref 3.7–5.3)
SODIUM: 137 meq/L (ref 137–147)
Total Protein: 6.2 g/dL (ref 6.0–8.3)

## 2014-08-16 LAB — URINE MICROSCOPIC-ADD ON

## 2014-08-16 LAB — LIPASE, BLOOD: LIPASE: 36 U/L (ref 11–59)

## 2014-08-16 MED ORDER — SODIUM CHLORIDE 0.9 % IV BOLUS (SEPSIS)
1000.0000 mL | Freq: Once | INTRAVENOUS | Status: AC
  Administered 2014-08-16: 1000 mL via INTRAVENOUS

## 2014-08-16 MED ORDER — IOHEXOL 300 MG/ML  SOLN
25.0000 mL | Freq: Once | INTRAMUSCULAR | Status: AC | PRN
  Administered 2014-08-16: 25 mL via ORAL

## 2014-08-16 MED ORDER — IOHEXOL 300 MG/ML  SOLN
100.0000 mL | Freq: Once | INTRAMUSCULAR | Status: AC | PRN
  Administered 2014-08-16: 100 mL via INTRAVENOUS

## 2014-08-16 MED ORDER — CEPHALEXIN 500 MG PO CAPS: 500.0000 mg | ORAL_CAPSULE | Freq: Four times a day (QID) | ORAL | Status: DC

## 2014-08-16 MED ORDER — PROMETHAZINE HCL 12.5 MG PO TABS: 12.5000 mg | ORAL_TABLET | Freq: Four times a day (QID) | ORAL | Status: DC | PRN

## 2014-08-16 NOTE — ED Notes (Signed)
Pt aware resp therapist will draw arterial blood as soon as available

## 2014-08-16 NOTE — ED Notes (Signed)
PA called and made aware IV team unable to obtain specimens for blood work. Requested an arterial stick if possible.

## 2014-08-16 NOTE — ED Notes (Signed)
Pt refusing blood draw provider informed

## 2014-08-16 NOTE — ED Notes (Signed)
Pt drank ct contrast ct informed

## 2014-08-16 NOTE — ED Notes (Signed)
Pt reports that she was recently started on a new HIV medication has been nauseated/vomiting. Reports that she has also been losing weight and unable to have a bowel movement. Reports that she is only able to eat one meal a day.

## 2014-08-16 NOTE — ED Provider Notes (Signed)
CSN: [id number]     Arrival date & time 08/16/14  1257 History   First MD Initiated Contact with Patient 08/16/14 1550     Chief Complaint  Patient presents with  . Constipation     (Consider location/radiation/quality/duration/timing/severity/associated sxs/prior Treatment) HPI Tina Olson is a 68 y.o. female, with history of HIV, hepatitis C, hypertension, followed by Dr. Ninetta Lights, presents emergency department complaining of generalized weakness, nausea, abdominal pain and distention. She states in mid July she was started on Harvoni for her treatment of hepatitis C. States since then she has had persistent nausea. She states that she has seen Dr. Ninetta Lights since then, but symptoms were not as severe at that time. She states she is taking Zofran with no relief of her nausea. She states nausea is constant, states it makes her "spit up." She states she did have emesis twice, states she has no appetite, not eating or drinking. She states she has generalized weakness. She denies any fever or chills. She states her abdomen swells up, states mainly upper part of the abdomen, which makes her more nauseated and states short of breath. She also admits to constipation, took milk of magnesium and stool softeners last week and had a bowel movement, no bowel movement since. She states that she stopped all of her medications one week ago, currently not taking anything. She denies any prior abdominal surgeries. No urinary symptoms. She denies any current abdominal pain. She states "I think I will blockage." Past Medical History  Diagnosis Date  . HIV infection   . Hypertension   . Hepatitis C    History reviewed. No pertinent past surgical history. Family History  Problem Relation Age of Onset  . Alzheimer's disease Mother   . Glaucoma Mother   . Hypertension Mother   . Cancer Father     ? type  . Seizures Son    History  Substance Use Topics  . Smoking status: Former Smoker    Types: Cigarettes    Quit date: 12/10/2000  . Smokeless tobacco: Never Used  . Alcohol Use: No   OB History   Grav Para Term Preterm Abortions TAB SAB Ect Mult Living                 Review of Systems  Constitutional: Positive for appetite change and fatigue. Negative for fever and chills.  Respiratory: Negative for cough, chest tightness and shortness of breath.   Cardiovascular: Negative for chest pain, palpitations and leg swelling.  Gastrointestinal: Positive for nausea, vomiting, abdominal pain and constipation. Negative for diarrhea and blood in stool.  Genitourinary: Negative for dysuria, flank pain and pelvic pain.  Musculoskeletal: Negative for arthralgias, myalgias, neck pain and neck stiffness.  Skin: Negative for rash.  Neurological: Positive for weakness. Negative for dizziness and headaches.  All other systems reviewed and are negative.     Allergies  Lisinopril and Other  Home Medications   Prior to Admission medications   Medication Sig Start Date End Date Taking? Authorizing Provider  aspirin EC 81 MG tablet Take 81 mg by mouth once.    Historical Provider, MD  fluticasone (FLONASE) 50 MCG/ACT nasal spray Place 1 spray into both nostrils daily.    Historical Provider, MD  hydrochlorothiazide (MICROZIDE) 12.5 MG capsule Take 12.5 mg by mouth every other day.    Historical Provider, MD  Ledipasvir-Sofosbuvir (HARVONI) 90-400 MG TABS Take 1 tablet by mouth daily. 06/23/14   [name], MD  Omega-3 Fatty Acids (FISH  OIL) 1200 MG CPDR Take by mouth.    Historical Provider, MD  ondansetron (ZOFRAN) 4 MG tablet Take 1 tablet (4 mg total) by mouth every 8 (eight) hours as needed for nausea or vomiting. 07/26/14   [name], MD  OVER THE COUNTER MEDICATION Take 1 tablet by mouth daily as needed. Gas Relief. As needed for gas.    Historical Provider, MD   BP 118/51  Pulse 65  Temp(Src) 98.3 F (36.8 C) (Oral)  Resp 13  Ht 5' (1.524 m)  Wt 130 lb (58.968 kg)  BMI 25.39  kg/m2  SpO2 93% Physical Exam  Nursing note and vitals reviewed. Constitutional: She appears well-developed and well-nourished. No distress.  HENT:  Head: Normocephalic.  Eyes: Conjunctivae are normal.  Neck: Neck supple.  Cardiovascular: Normal rate, regular rhythm and normal heart sounds.   Pulmonary/Chest: Effort normal and breath sounds normal. No respiratory distress. She has no wheezes. She has no rales.  Abdominal: Soft. Bowel sounds are normal. She exhibits no distension. There is no tenderness. There is no rebound.  No CVA tenderness bilaterally  Musculoskeletal: She exhibits no edema.  Neurological: She is alert.  Skin: Skin is warm and dry.  Psychiatric: She has a normal mood and affect. Her behavior is normal.    ED Course  Procedures (including critical care time) Labs Review Labs Reviewed  URINALYSIS, ROUTINE W REFLEX MICROSCOPIC - Abnormal; Notable for the following:    Color, Urine AMBER (*)    APPearance CLOUDY (*)    Specific Gravity, Urine 1.035 (*)    Bilirubin Urine SMALL (*)    Ketones, ur 15 (*)    Leukocytes, UA MODERATE (*)    All other components within normal limits  URINE MICROSCOPIC-ADD ON - Abnormal; Notable for the following:    Squamous Epithelial / LPF MANY (*)    Bacteria, UA MANY (*)    All other components within normal limits  CBC WITH DIFFERENTIAL  COMPREHENSIVE METABOLIC PANEL  LIPASE, BLOOD  I-STAT TROPOININ, ED    Imaging Review Ct Abdomen Pelvis W Contrast  08/16/2014   CLINICAL DATA:  Nausea and vomiting.  Constipation.  EXAM: CT ABDOMEN AND PELVIS WITH CONTRAST  TECHNIQUE: Multidetector CT imaging of the abdomen and pelvis was performed using the standard protocol following bolus administration of intravenous contrast.  CONTRAST:  OMNIPAQUE IOHEXOL 300 MG/ML  SOLN  COMPARISON:  No similar prior exam is available at this institution for comparison or on [facility]. Abdominal ultrasound 05/20/2014 is compared.  FINDINGS:  Minimal dependent bibasilar atelectasis is noted.  Renal cortical cysts are reidentified, largest right upper renal pole 1.6 cm image 19. The left kidney, adrenal glands, gallbladder, liver, spleen, and pancreas are normal. Moderate atheromatous aortic calcification noted without aneurysm. No lymphadenopathy.  Uterine contour abnormality most compatible with fibroids noted. Ovaries are normal. Normal appendix. No bowel wall thickening or focal segmental dilatation. Fat containing right inguinal hernia incidentally noted.  No acute osseous abnormality.  IMPRESSION: No acute intra-abdominal or pelvic pathology.   Electronically Signed   By: Christiana Pellant M.D.   On: 08/16/2014 19:21    Date: 08/17/2014  Rate: 51  Rhythm: sinus bradycardia  QRS Axis: normal  Intervals: normal  ST/T Wave abnormalities: Abnormal R wave progression  Conduction Disutrbances:none  Narrative Interpretation:   Old EKG Reviewed: unchanged    MDM   Final diagnoses:  Nausea  UTI (lower urinary tract infection)    Pt with  Upper abdominal intermittent  distention, nausea, vomiting. She is well appearing, spitting saliva in an emesis bag during my exam. She is concerned that she is "wasting away." She states she is not eating, not drinking, not having a bowel movement. Recently started on harvoni for hep c, but she is concerned that it is what is making her sick, stopped it 1 week ago. Based on exam, pt does not appear to be dehydrated. Her VS are normal. Abdomen is benign, non tender at this time. Pt is concerned about obstruction. No risk factors. However, given her age, vomiting, no bowel movements, will get CT abd/pelvis for evaluation. IV fluids ordered.   8:11 PM Unable to get CBC, only got small blood when placing IV, patient refused or stick. Patient is very difficult stick. Metabolic panel including LFTs and lipase are normal. Albumin is low. Urine possibly infected, however is contaminated. Patient received IV  fluids. CT abdomen pelvis obtained and is unremarkable. Patient is in no distress. She is tolerating POs in ED. She is stable to be d/c home at this time. Will need fu with Dr. Ninetta Lights. i suspect she may have this nausea from her medications vs from GERD. Advised to restart anti acids. Pt agreed to the plan.   Filed Vitals:   08/16/14 1845 08/16/14 2000 08/16/14 2015 08/16/14 2030  BP: 145/77 138/84 129/85 127/90  Pulse:    60  Temp:      TempSrc:      Resp: Height:      Weight:      SpO2:    100%     Tara Wich A Breken Nazari, PA-C 08/17/14 0104

## 2014-08-16 NOTE — ED Notes (Signed)
RT called to request an arterial stick for blood work on pt.

## 2014-08-16 NOTE — ED Notes (Signed)
IV THERAPIST CALLED AFTER WRITER UNABLE TO FIND ACCESS FOR IV/BLOOD DRAW

## 2014-08-16 NOTE — ED Notes (Signed)
IV THERAPIST CALLED BACK AND AWARE NEED FOR IV FOR CT OF ABD WITH CONTRAST AND NEED FOR BLOOD/ LAB DRAW YET

## 2014-08-16 NOTE — Discharge Instructions (Signed)
Your lab work and CT scan look good today. Your urine may be infected. Take keflex as prescribed for infection until all gone. Take phenergan for nausea, make sure to restart your anti acids. Follow up with Dr. Ninetta Lights in the office. Return if worsening.   Nausea, Adult Nausea is the feeling that you have an upset stomach or have to vomit. Nausea by itself is not likely a serious concern, but it may be an early sign of more serious medical problems. As nausea gets worse, it can lead to vomiting. If vomiting develops, there is the risk of dehydration.  CAUSES   Viral infections.  Food poisoning.  Medicines.  Pregnancy.  Motion sickness.  Migraine headaches.  Emotional distress.  Severe pain from any source.  Alcohol intoxication. HOME CARE INSTRUCTIONS  Get plenty of rest.  Ask your caregiver about specific rehydration instructions.  Eat small amounts of food and sip liquids more often.  Take all medicines as told by your caregiver. SEEK MEDICAL CARE IF:  You have not improved after 2 days, or you get worse.  You have a headache. SEEK IMMEDIATE MEDICAL CARE IF:   You have a fever.  You faint.  You keep vomiting or have blood in your vomit.  You are extremely weak or dehydrated.  You have dark or bloody stools.  You have severe chest or abdominal pain. MAKE SURE YOU:  Understand these instructions.  Will watch your condition.  Will get help right away if you are not doing well or get worse. Document Released: 01/03/2005 Document Revised: 08/20/2012 Document Reviewed: 08/08/2011 [facility] Patient Information 2015 [place], [place]. This information is not intended to replace advice given to you by your health care provider. Make sure you discuss any questions you have with your health care provider.

## 2014-08-18 LAB — URINE CULTURE

## 2014-08-20 NOTE — ED Provider Notes (Signed)
Medical screening examination/treatment/procedure(s) were performed by non-physician practitioner and as supervising physician I was immediately available for consultation/collaboration.   EKG Interpretation   Date/Time:  Monday August 16 2014 16:07:51 EDT Ventricular Rate:  51 PR Interval:  149 QRS Duration: 84 QT Interval:  436 QTC Calculation: 401 R Axis:   31 Text Interpretation:  Sinus rhythm Abnormal R-wave progression, early  transition Minimal ST depression Baseline wander in lead(s) V6 ED  PHYSICIAN INTERPRETATION AVAILABLE IN [facility] Confirmed by TEST,  Record ([id number]) on 08/18/2014 7:18:44 AM       Derwood Kaplan, MD 08/20/14 0272

## 2014-10-04 ENCOUNTER — Other Ambulatory Visit: Payer: Medicare Other

## 2014-10-04 DIAGNOSIS — B182 Chronic viral hepatitis C: Secondary | ICD-10-CM

## 2014-10-04 DIAGNOSIS — B20 Human immunodeficiency virus [HIV] disease: Secondary | ICD-10-CM

## 2014-10-05 LAB — CBC WITH DIFFERENTIAL/PLATELET
BASOS PCT: 1 % (ref 0–1)
Basophils Absolute: 0 10*3/uL (ref 0.0–0.1)
EOS ABS: 0 10*3/uL (ref 0.0–0.7)
Eosinophils Relative: 1 % (ref 0–5)
HCT: 34.7 % — ABNORMAL LOW (ref 36.0–46.0)
Hemoglobin: 11.5 g/dL — ABNORMAL LOW (ref 12.0–15.0)
Lymphocytes Relative: 54 % — ABNORMAL HIGH (ref 12–46)
Lymphs Abs: 2.3 10*3/uL (ref 0.7–4.0)
MCH: 26.4 pg (ref 26.0–34.0)
MCHC: 33.1 g/dL (ref 30.0–36.0)
MCV: 79.6 fL (ref 78.0–100.0)
Monocytes Absolute: 0.3 10*3/uL (ref 0.1–1.0)
Monocytes Relative: 8 % (ref 3–12)
NEUTROS PCT: 36 % — AB (ref 43–77)
Neutro Abs: 1.5 10*3/uL — ABNORMAL LOW (ref 1.7–7.7)
PLATELETS: 160 10*3/uL (ref 150–400)
RBC: 4.36 MIL/uL (ref 3.87–5.11)
RDW: 14.9 % (ref 11.5–15.5)
WBC: 4.2 10*3/uL (ref 4.0–10.5)

## 2014-10-05 LAB — T-HELPER CELL (CD4) - (RCID CLINIC ONLY)
CD4 T CELL HELPER: 36 % (ref 33–55)
CD4 T Cell Abs: 890 /uL (ref 400–2700)

## 2014-10-05 LAB — HEPATITIS C RNA QUANTITATIVE: HCV QUANT: NOT DETECTED [IU]/mL (ref <15)

## 2014-10-07 LAB — COMPLETE METABOLIC PANEL WITH GFR
ALK PHOS: 59 U/L (ref 39–117)
ALT: 8 U/L (ref 0–35)
AST: 18 U/L (ref 0–37)
Albumin: 4.1 g/dL (ref 3.5–5.2)
BILIRUBIN TOTAL: 0.4 mg/dL (ref 0.2–1.2)
BUN: 17 mg/dL (ref 6–23)
CO2: 23 mEq/L (ref 19–32)
Calcium: 9.6 mg/dL (ref 8.4–10.5)
Chloride: 101 mEq/L (ref 96–112)
Creat: 1.01 mg/dL (ref 0.50–1.10)
GFR, EST NON AFRICAN AMERICAN: 58 mL/min — AB
GFR, Est African American: 67 mL/min
Glucose, Bld: 97 mg/dL (ref 70–99)
Potassium: 4 mEq/L (ref 3.5–5.3)
SODIUM: 135 meq/L (ref 135–145)
Total Protein: 8.4 g/dL — ABNORMAL HIGH (ref 6.0–8.3)

## 2014-10-07 LAB — HIV-1 RNA QUANT-NO REFLEX-BLD
HIV 1 RNA Quant: 406 copies/mL — ABNORMAL HIGH (ref <20)
HIV-1 RNA Quant, Log: 2.61 {Log} — ABNORMAL HIGH (ref <1.30)

## 2014-10-18 ENCOUNTER — Ambulatory Visit: Payer: Medicare Other | Admitting: Infectious Diseases

## 2014-10-25 ENCOUNTER — Ambulatory Visit (INDEPENDENT_AMBULATORY_CARE_PROVIDER_SITE_OTHER): Payer: Medicare Other | Admitting: Infectious Diseases

## 2014-10-25 ENCOUNTER — Encounter: Payer: Self-pay | Admitting: Infectious Diseases

## 2014-10-25 VITALS — BP 123/80 | HR 64 | Temp 97.7°F | Ht 60.0 in | Wt 134.0 lb

## 2014-10-25 DIAGNOSIS — B182 Chronic viral hepatitis C: Secondary | ICD-10-CM

## 2014-10-25 DIAGNOSIS — Z79899 Other long term (current) drug therapy: Secondary | ICD-10-CM

## 2014-10-25 DIAGNOSIS — Z113 Encounter for screening for infections with a predominantly sexual mode of transmission: Secondary | ICD-10-CM

## 2014-10-25 DIAGNOSIS — Z23 Encounter for immunization: Secondary | ICD-10-CM

## 2014-10-25 DIAGNOSIS — I10 Essential (primary) hypertension: Secondary | ICD-10-CM

## 2014-10-25 DIAGNOSIS — B20 Human immunodeficiency virus [HIV] disease: Secondary | ICD-10-CM

## 2014-10-25 NOTE — Assessment & Plan Note (Signed)
Will recheck at next visit. I am hopefull that she is cured despite shortened med course.

## 2014-10-25 NOTE — Progress Notes (Signed)
   Subjective:    Patient ID: Tina Olson, female    DOB: 10/21/1946, 68 y.o.   MRN: [id number]  HPI 68 yo F with HIV+/elite controller (can't do study as she is a hard stick), HTN and Hepatitis C (1b)- not clear if [facility]. She had normal PAP August 2014. Hep A Ab+, Hep B SAb- at last blood draw, restarted Hep B series.  Was started on harvoni in July 2015 for her Hep C (VL 6.5 million) and F0 elastography/ultrasound. She was not able to tolerate this (thinks she took ~ 1/2 of the rx, didn't get last bottle).   HIV 1 RNA QUANT (copies/mL)  Date Value  10/04/2014 406*  08/02/2014 476*  03/31/2014 326*   CD4 T CELL ABS (/uL)  Date Value  10/04/2014 890  03/31/2014 680  09/30/2013 870   Hepatitis C RNA quantitative Latest Ref Rng 10/04/2014 08/02/2014 05/17/2014 03/31/2014 08/12/2008  HCV Quantitative <15 IU/mL Not Detected Not Detected [id number](H) [id number](H) 8540000(H)  HCV Quantitative Log <1.18 log 10 NOT CALC NOT CALC 6.81(H) 6.84(H) -    Review of Systems  Constitutional: Negative for fever, chills, appetite change and unexpected weight change.  Gastrointestinal: Negative for abdominal pain, diarrhea and constipation.  Genitourinary: Negative for dysuria and difficulty urinating.       Objective:   Physical Exam  Constitutional: She appears well-developed and well-nourished.  HENT:  Mouth/Throat: No oropharyngeal exudate.  Eyes: EOM are normal. Pupils are equal, round, and reactive to light.  Neck: Neck supple.  Cardiovascular: Normal rate, regular rhythm and normal heart sounds.   Pulmonary/Chest: Effort normal and breath sounds normal.  Abdominal: Soft. Bowel sounds are normal. She exhibits no distension. There is no tenderness.  Musculoskeletal: She exhibits no edema.  Lymphadenopathy:    She has no cervical adenopathy.          Assessment & Plan:

## 2014-10-25 NOTE — Assessment & Plan Note (Signed)
Well controlled, will continue to watch on HCTZ

## 2014-10-25 NOTE — Assessment & Plan Note (Addendum)
She is elite controller. Doing very well. Offered/refused condoms. Has gotten flu shot. pnvx is up to date. Will get PAP next month.

## 2014-10-26 LAB — URINALYSIS, ROUTINE W REFLEX MICROSCOPIC
Bilirubin Urine: NEGATIVE
GLUCOSE, UA: NEGATIVE mg/dL
HGB URINE DIPSTICK: NEGATIVE
KETONES UR: NEGATIVE mg/dL
LEUKOCYTES UA: NEGATIVE
Nitrite: NEGATIVE
Protein, ur: NEGATIVE mg/dL
Specific Gravity, Urine: 1.013 (ref 1.005–1.030)
Urobilinogen, UA: 0.2 mg/dL (ref 0.0–1.0)
pH: 7 (ref 5.0–8.0)

## 2014-11-12 ENCOUNTER — Ambulatory Visit (INDEPENDENT_AMBULATORY_CARE_PROVIDER_SITE_OTHER): Payer: Medicare Other | Admitting: *Deleted

## 2014-11-12 ENCOUNTER — Other Ambulatory Visit (HOSPITAL_COMMUNITY)
Admission: RE | Admit: 2014-11-12 | Discharge: 2014-11-12 | Disposition: A | Discharge status: Home / Self Care | Payer: Medicare Other | Source: Ambulatory Visit | Attending: Infectious Diseases | Admitting: Infectious Diseases

## 2014-11-12 DIAGNOSIS — Z124 Encounter for screening for malignant neoplasm of cervix: Secondary | ICD-10-CM

## 2014-11-12 NOTE — Progress Notes (Signed)
  Subjective:     Tina Olson is a 68 y.o. woman who comes in today for a  pap smear only.  Previous abnormal Pap smears:  no. Contraception: condoms  Pelvic Exam:  Pap smear obtained.   Assessment:    Screening pap smear.   Plan:    Follow up in one year, or as indicated by Pap results.  Pt given educational materials re: HIV and women, self-esteem, BSE, nutrition and diet management, PAP smears and partner safety. Pt given condoms

## 2014-11-12 NOTE — Patient Instructions (Signed)
  Your results will be ready in about a week.  I will mail them to you.  Thank you for coming to the Center for your care.  Happy Holidays!  Denise, RN 

## 2014-11-15 LAB — CYTOLOGY - PAP

## 2014-11-17 ENCOUNTER — Encounter: Payer: Self-pay | Admitting: *Deleted

## 2014-12-10 HISTORY — PX: CATARACT EXTRACTION: SUR2

## 2014-12-13 DIAGNOSIS — K219 Gastro-esophageal reflux disease without esophagitis: Secondary | ICD-10-CM | POA: Diagnosis not present

## 2014-12-13 DIAGNOSIS — I10 Essential (primary) hypertension: Secondary | ICD-10-CM | POA: Diagnosis not present

## 2015-01-26 DIAGNOSIS — B20 Human immunodeficiency virus [HIV] disease: Secondary | ICD-10-CM | POA: Diagnosis not present

## 2015-01-26 DIAGNOSIS — H5703 Miosis: Secondary | ICD-10-CM | POA: Diagnosis not present

## 2015-01-26 DIAGNOSIS — H25813 Combined forms of age-related cataract, bilateral: Secondary | ICD-10-CM | POA: Diagnosis not present

## 2015-01-26 DIAGNOSIS — H04123 Dry eye syndrome of bilateral lacrimal glands: Secondary | ICD-10-CM | POA: Diagnosis not present

## 2015-02-21 DIAGNOSIS — H268 Other specified cataract: Secondary | ICD-10-CM | POA: Diagnosis not present

## 2015-02-21 DIAGNOSIS — H2511 Age-related nuclear cataract, right eye: Secondary | ICD-10-CM | POA: Diagnosis not present

## 2015-02-21 DIAGNOSIS — H5703 Miosis: Secondary | ICD-10-CM | POA: Diagnosis not present

## 2015-05-27 ENCOUNTER — Emergency Department (HOSPITAL_COMMUNITY)
Admission: EM | Admit: 2015-05-27 | Discharge: 2015-05-27 | Disposition: A | Discharge status: Home / Self Care | Payer: No Typology Code available for payment source | Attending: Emergency Medicine | Admitting: Emergency Medicine

## 2015-05-27 ENCOUNTER — Encounter (HOSPITAL_COMMUNITY): Payer: Self-pay | Admitting: *Deleted

## 2015-05-27 DIAGNOSIS — Y92410 Unspecified street and highway as the place of occurrence of the external cause: Secondary | ICD-10-CM | POA: Insufficient documentation

## 2015-05-27 DIAGNOSIS — Y999 Unspecified external cause status: Secondary | ICD-10-CM | POA: Diagnosis not present

## 2015-05-27 DIAGNOSIS — S0093XA Contusion of unspecified part of head, initial encounter: Secondary | ICD-10-CM | POA: Insufficient documentation

## 2015-05-27 DIAGNOSIS — I10 Essential (primary) hypertension: Secondary | ICD-10-CM | POA: Insufficient documentation

## 2015-05-27 DIAGNOSIS — Z8619 Personal history of other infectious and parasitic diseases: Secondary | ICD-10-CM | POA: Insufficient documentation

## 2015-05-27 DIAGNOSIS — Y9389 Activity, other specified: Secondary | ICD-10-CM | POA: Insufficient documentation

## 2015-05-27 DIAGNOSIS — S0990XA Unspecified injury of head, initial encounter: Secondary | ICD-10-CM | POA: Diagnosis present

## 2015-05-27 DIAGNOSIS — Z87891 Personal history of nicotine dependence: Secondary | ICD-10-CM | POA: Diagnosis not present

## 2015-05-27 DIAGNOSIS — Z21 Asymptomatic human immunodeficiency virus [HIV] infection status: Secondary | ICD-10-CM | POA: Insufficient documentation

## 2015-05-27 NOTE — ED Notes (Signed)
Pt escorted to discharge window. Pt verbalized understanding discharge instructions. In no acute distress.  

## 2015-05-27 NOTE — ED Notes (Signed)
Pt alert and oriented x4. Respirations even and unlabored, bilateral symmetrical rise and fall of chest. Skin warm and dry. In no acute distress. Denies needs.   
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
## 2015-05-27 NOTE — Discharge Instructions (Signed)

## 2015-06-27 ENCOUNTER — Other Ambulatory Visit: Payer: Medicare Other

## 2015-06-27 DIAGNOSIS — Z113 Encounter for screening for infections with a predominantly sexual mode of transmission: Secondary | ICD-10-CM

## 2015-06-27 DIAGNOSIS — B182 Chronic viral hepatitis C: Secondary | ICD-10-CM

## 2015-06-27 DIAGNOSIS — Z79899 Other long term (current) drug therapy: Secondary | ICD-10-CM

## 2015-06-27 DIAGNOSIS — B20 Human immunodeficiency virus [HIV] disease: Secondary | ICD-10-CM

## 2015-06-28 LAB — CBC
HCT: 33.6 % — ABNORMAL LOW (ref 36.0–46.0)
HEMOGLOBIN: 10.8 g/dL — AB (ref 12.0–15.0)
MCH: 24.5 pg — ABNORMAL LOW (ref 26.0–34.0)
MCHC: 32.1 g/dL (ref 30.0–36.0)
MCV: 76.2 fL — AB (ref 78.0–100.0)
Platelets: 167 10*3/uL (ref 150–400)
RBC: 4.41 MIL/uL (ref 3.87–5.11)
RDW: 16.7 % — ABNORMAL HIGH (ref 11.5–15.5)
WBC: 3.8 10*3/uL — ABNORMAL LOW (ref 4.0–10.5)

## 2015-06-28 LAB — COMPREHENSIVE METABOLIC PANEL
ALT: 9 U/L (ref 0–35)
AST: 17 U/L (ref 0–37)
Albumin: 4 g/dL (ref 3.5–5.2)
Alkaline Phosphatase: 59 U/L (ref 39–117)
BUN: 16 mg/dL (ref 6–23)
CALCIUM: 9.9 mg/dL (ref 8.4–10.5)
CHLORIDE: 103 meq/L (ref 96–112)
CO2: 21 meq/L (ref 19–32)
Creat: 1.04 mg/dL (ref 0.50–1.10)
Glucose, Bld: 95 mg/dL (ref 70–99)
Potassium: 3.7 mEq/L (ref 3.5–5.3)
Sodium: 138 mEq/L (ref 135–145)
Total Bilirubin: 0.5 mg/dL (ref 0.2–1.2)
Total Protein: 8.4 g/dL — ABNORMAL HIGH (ref 6.0–8.3)

## 2015-06-28 LAB — RPR

## 2015-06-28 LAB — HIV-1 RNA QUANT-NO REFLEX-BLD
HIV 1 RNA Quant: 115 copies/mL — ABNORMAL HIGH (ref <20)
HIV-1 RNA Quant, Log: 2.06 {Log} — ABNORMAL HIGH (ref <1.30)

## 2015-06-28 LAB — LIPID PANEL
CHOLESTEROL: 206 mg/dL — AB (ref 0–200)
HDL: 54 mg/dL (ref >=46)
LDL CALC: 119 mg/dL — AB (ref 0–99)
Total CHOL/HDL Ratio: 3.8 Ratio
Triglycerides: 165 mg/dL — ABNORMAL HIGH (ref <150)
VLDL: 33 mg/dL (ref 0–40)

## 2015-06-28 LAB — HEPATITIS C RNA QUANTITATIVE: HCV Quantitative: NOT DETECTED IU/mL (ref <15)

## 2015-06-28 LAB — T-HELPER CELL (CD4) - (RCID CLINIC ONLY)
CD4 T CELL HELPER: 35 % (ref 33–55)
CD4 T Cell Abs: 720 /uL (ref 400–2700)

## 2015-07-18 ENCOUNTER — Encounter: Payer: Self-pay | Admitting: Infectious Diseases

## 2015-07-18 ENCOUNTER — Ambulatory Visit (INDEPENDENT_AMBULATORY_CARE_PROVIDER_SITE_OTHER): Payer: Medicare Other | Admitting: Infectious Diseases

## 2015-07-18 VITALS — BP 145/82 | HR 71 | Temp 98.0°F | Wt 135.0 lb

## 2015-07-18 DIAGNOSIS — I10 Essential (primary) hypertension: Secondary | ICD-10-CM

## 2015-07-18 DIAGNOSIS — B182 Chronic viral hepatitis C: Secondary | ICD-10-CM

## 2015-07-18 DIAGNOSIS — Z113 Encounter for screening for infections with a predominantly sexual mode of transmission: Secondary | ICD-10-CM

## 2015-07-18 DIAGNOSIS — B20 Human immunodeficiency virus [HIV] disease: Secondary | ICD-10-CM

## 2015-07-18 DIAGNOSIS — Z79899 Other long term (current) drug therapy: Secondary | ICD-10-CM

## 2015-07-18 NOTE — Assessment & Plan Note (Addendum)
Slightly elevated today but still near normal  She monitors at home and is normal there.  Will continue to monitor.

## 2015-07-18 NOTE — Progress Notes (Signed)
   Subjective:    Patient ID: Elpidio Eric, female    DOB: 03/27/1946, 69 y.o.   MRN: [id number]  HPI 69 yo F with HIV+/elite controller (can't do study as she is a hard stick), HTN and Hepatitis C (1b)- not clear if treated before at [facility]. She had normal PAP August 2014. Hep A Ab+, Hep B SAb- at last blood draw, restarted Hep B series.  Was started on harvoni in July 2015 for her Hep C (VL 6.5 million) and F0 elastography/ultrasound. She was not able to tolerate this (thinks she took ~ 1/2 of the rx, didn't get last bottle). Her Hep C RNA has been negative (06-27-15 and 10-04-14).  Takes her BP at home- SBP 120s. No headaches, no sob, no cp.   HIV 1 RNA QUANT (copies/mL)  Date Value  06/27/2015 115*  10/04/2014 406*  08/02/2014 476*   CD4 T CELL ABS (/uL)  Date Value  06/27/2015 720  10/04/2014 890  03/31/2014 680   PAP due 11-2015. Lat negative Mammo due 05-2015.  Last negative Wearing seat belt.  Going to use stationary bike when repaired.   Review of Systems  Constitutional: Negative for appetite change and unexpected weight change.  HENT: Negative for mouth sores.   Respiratory: Negative for shortness of breath.   Cardiovascular: Negative for chest pain.  Gastrointestinal: Positive for constipation. Negative for diarrhea.  Genitourinary: Negative for dysuria, urgency and difficulty urinating.  Neurological: Negative for headaches.  malodorous urine.      Objective:   Physical Exam  Constitutional: She appears well-developed and well-nourished.  HENT:  Mouth/Throat: No oropharyngeal exudate.  Eyes: EOM are normal. Pupils are equal, round, and reactive to light.  Neck: Neck supple.  Cardiovascular: Normal rate, regular rhythm and normal heart sounds.   Pulmonary/Chest: Effort normal and breath sounds normal.  Abdominal: Soft. Bowel sounds are normal.  Musculoskeletal: She exhibits no edema.  Lymphadenopathy:    She has no cervical adenopathy.         Assessment & Plan:

## 2015-07-18 NOTE — Assessment & Plan Note (Signed)
She is doing well, witll continue to watch her off meds.  Will set her up for pap/mammo in December.  encourge exercise, diet.  rtc 6 months with labs prior.

## 2015-07-18 NOTE — Assessment & Plan Note (Signed)
Treated, cured, resolved,  Will mark as such

## 2015-08-02 ENCOUNTER — Other Ambulatory Visit: Payer: Self-pay | Admitting: Infectious Diseases

## 2015-08-02 DIAGNOSIS — Z1231 Encounter for screening mammogram for malignant neoplasm of breast: Secondary | ICD-10-CM

## 2015-08-12 ENCOUNTER — Ambulatory Visit: Payer: Self-pay

## 2015-08-17 ENCOUNTER — Ambulatory Visit
Admission: RE | Admit: 2015-08-17 | Discharge: 2015-08-17 | Disposition: A | Discharge status: Home / Self Care | Payer: Medicare Other | Source: Ambulatory Visit | Attending: Infectious Diseases | Admitting: Infectious Diseases

## 2015-09-30 ENCOUNTER — Encounter: Payer: Self-pay | Admitting: *Deleted

## 2015-12-11 DIAGNOSIS — D649 Anemia, unspecified: Secondary | ICD-10-CM

## 2015-12-11 DIAGNOSIS — Z9289 Personal history of other medical treatment: Secondary | ICD-10-CM

## 2015-12-11 HISTORY — DX: Personal history of other medical treatment: Z92.89

## 2015-12-11 HISTORY — DX: Anemia, unspecified: D64.9

## 2016-01-16 ENCOUNTER — Other Ambulatory Visit (HOSPITAL_COMMUNITY)
Admission: RE | Admit: 2016-01-16 | Discharge: 2016-01-16 | Disposition: A | Discharge status: Home / Self Care | Payer: Medicare Other | Source: Ambulatory Visit | Attending: Infectious Diseases | Admitting: Infectious Diseases

## 2016-01-16 ENCOUNTER — Other Ambulatory Visit: Payer: Medicare Other

## 2016-01-16 DIAGNOSIS — Z113 Encounter for screening for infections with a predominantly sexual mode of transmission: Secondary | ICD-10-CM | POA: Diagnosis present

## 2016-01-16 DIAGNOSIS — Z79899 Other long term (current) drug therapy: Secondary | ICD-10-CM

## 2016-01-16 DIAGNOSIS — B20 Human immunodeficiency virus [HIV] disease: Secondary | ICD-10-CM

## 2016-01-16 LAB — CBC
HCT: 33 % — ABNORMAL LOW (ref 36.0–46.0)
HEMOGLOBIN: 10.5 g/dL — AB (ref 12.0–15.0)
MCH: 23.3 pg — AB (ref 26.0–34.0)
MCHC: 31.8 g/dL (ref 30.0–36.0)
MCV: 73.3 fL — ABNORMAL LOW (ref 78.0–100.0)
MPV: 10.6 fL (ref 8.6–12.4)
PLATELETS: 162 10*3/uL (ref 150–400)
RBC: 4.5 MIL/uL (ref 3.87–5.11)
RDW: 16.5 % — ABNORMAL HIGH (ref 11.5–15.5)
WBC: 3.6 10*3/uL — ABNORMAL LOW (ref 4.0–10.5)

## 2016-01-16 LAB — COMPREHENSIVE METABOLIC PANEL
ALT: 12 U/L (ref 6–29)
AST: 37 U/L — ABNORMAL HIGH (ref 10–35)
Albumin: 4 g/dL (ref 3.6–5.1)
Alkaline Phosphatase: 48 U/L (ref 33–130)
BUN: 16 mg/dL (ref 7–25)
CHLORIDE: 102 mmol/L (ref 98–110)
CO2: 21 mmol/L (ref 20–31)
Calcium: 9.6 mg/dL (ref 8.6–10.4)
Creat: 1.01 mg/dL — ABNORMAL HIGH (ref 0.50–0.99)
GLUCOSE: 90 mg/dL (ref 65–99)
POTASSIUM: 6.1 mmol/L — AB (ref 3.5–5.3)
Sodium: 133 mmol/L — ABNORMAL LOW (ref 135–146)
Total Bilirubin: 0.6 mg/dL (ref 0.2–1.2)
Total Protein: 8.6 g/dL — ABNORMAL HIGH (ref 6.1–8.1)

## 2016-01-16 LAB — LIPID PANEL
CHOL/HDL RATIO: 3.7 ratio (ref <=5.0)
Cholesterol: 201 mg/dL — ABNORMAL HIGH (ref 125–200)
HDL: 55 mg/dL (ref >=46)
LDL Cholesterol: 114 mg/dL (ref <130)
Triglycerides: 161 mg/dL — ABNORMAL HIGH (ref <150)
VLDL: 32 mg/dL — AB (ref <30)

## 2016-01-17 LAB — URINE CYTOLOGY ANCILLARY ONLY
Chlamydia: NEGATIVE
Neisseria Gonorrhea: NEGATIVE

## 2016-01-17 LAB — T-HELPER CELL (CD4) - (RCID CLINIC ONLY)
CD4 % Helper T Cell: 34 % (ref 33–55)
CD4 T CELL ABS: 630 /uL (ref 400–2700)

## 2016-01-17 LAB — HIV-1 RNA QUANT-NO REFLEX-BLD
HIV 1 RNA Quant: 190 copies/mL — ABNORMAL HIGH (ref <20)
HIV-1 RNA Quant, Log: 2.28 Log copies/mL — ABNORMAL HIGH (ref <1.30)

## 2016-01-17 LAB — RPR

## 2016-01-18 ENCOUNTER — Other Ambulatory Visit: Payer: Medicare Other

## 2016-01-18 DIAGNOSIS — B20 Human immunodeficiency virus [HIV] disease: Secondary | ICD-10-CM

## 2016-01-18 LAB — BASIC METABOLIC PANEL WITH GFR
BUN: 18 mg/dL (ref 7–25)
CALCIUM: 9.8 mg/dL (ref 8.6–10.4)
CHLORIDE: 101 mmol/L (ref 98–110)
CO2: 22 mmol/L (ref 20–31)
Creat: 1.16 mg/dL — ABNORMAL HIGH (ref 0.50–0.99)
GFR, EST NON AFRICAN AMERICAN: 49 mL/min — AB (ref >=60)
GFR, Est African American: 56 mL/min — ABNORMAL LOW (ref >=60)
Glucose, Bld: 95 mg/dL (ref 65–99)
POTASSIUM: 4 mmol/L (ref 3.5–5.3)
SODIUM: 135 mmol/L (ref 135–146)

## 2016-01-30 ENCOUNTER — Encounter: Payer: Self-pay | Admitting: Infectious Diseases

## 2016-01-30 ENCOUNTER — Ambulatory Visit (INDEPENDENT_AMBULATORY_CARE_PROVIDER_SITE_OTHER): Payer: Medicare Other | Admitting: Infectious Diseases

## 2016-01-30 VITALS — BP 148/60 | HR 61 | Temp 97.9°F

## 2016-01-30 DIAGNOSIS — I10 Essential (primary) hypertension: Secondary | ICD-10-CM

## 2016-01-30 DIAGNOSIS — B20 Human immunodeficiency virus [HIV] disease: Secondary | ICD-10-CM | POA: Diagnosis not present

## 2016-01-30 NOTE — Assessment & Plan Note (Signed)
Mildly elevated, will f/u with her PCP

## 2016-01-30 NOTE — Assessment & Plan Note (Signed)
Has gotten flu Not sexually active Will continue to watch her off art.  Will need to continue to watch her Cr Will get PAP this week, mammo Sept 2017 Will see her back in 6 months

## 2016-01-30 NOTE — Progress Notes (Signed)
   Subjective:    Patient ID: Tina Olson, female    DOB: [birth year], 70 y.o.   MRN: [id number]  HPI 70 yo F with HIV+/elite controller (can't do study as she is a hard stick), HTN and Hepatitis C (1b)- not clear if treated before at [facility]. She had normal PAP August 2014. Hep A Ab+, Hep B SAb- . Completed second series 2014.  Was started on harvoni in July 2015 for her Hep C (VL 6.5 million) and F0 elastography/ultrasound. She was not able to tolerate this (thinks she took ~ 1/2 of the rx, didn't get last bottle). Her Hep C RNA has been negative (06-27-15 and 10-04-14,).   HIV 1 RNA QUANT (copies/mL)  Date Value  01/16/2016 190*  06/27/2015 115*  10/04/2014 406*   CD4 T CELL ABS (/uL)  Date Value  01/16/2016 630  06/27/2015 720  10/04/2014 890   Has been feeling well.  Is scheduled for PAP 2-25  Review of Systems  Constitutional: Negative for appetite change and unexpected weight change.  Respiratory: Negative for shortness of breath.   Cardiovascular: Negative for chest pain.  Gastrointestinal: Positive for constipation. Negative for diarrhea.  Genitourinary: Negative for difficulty urinating.  Neurological: Negative for headaches.  takes prn generic miralax     Objective:   Physical Exam  Constitutional: She appears well-developed and well-nourished.  HENT:  Mouth/Throat: No oropharyngeal exudate.  Eyes: EOM are normal. Pupils are equal, round, and reactive to light.  Neck: Neck supple.  Cardiovascular: Normal rate, regular rhythm and normal heart sounds.   Pulmonary/Chest: Effort normal and breath sounds normal.  Abdominal: Soft. Bowel sounds are normal. There is no tenderness. There is no rebound.  Musculoskeletal: She exhibits no edema.  Lymphadenopathy:    She has no cervical adenopathy.      Assessment & Plan:

## 2016-02-03 ENCOUNTER — Other Ambulatory Visit (HOSPITAL_COMMUNITY)
Admission: RE | Admit: 2016-02-03 | Discharge: 2016-02-03 | Disposition: A | Discharge status: Home / Self Care | Payer: Medicare Other | Source: Ambulatory Visit | Attending: Infectious Diseases | Admitting: Infectious Diseases

## 2016-02-03 ENCOUNTER — Ambulatory Visit (INDEPENDENT_AMBULATORY_CARE_PROVIDER_SITE_OTHER): Payer: Medicare Other | Admitting: *Deleted

## 2016-02-03 DIAGNOSIS — Z124 Encounter for screening for malignant neoplasm of cervix: Secondary | ICD-10-CM

## 2016-02-03 DIAGNOSIS — Z113 Encounter for screening for infections with a predominantly sexual mode of transmission: Secondary | ICD-10-CM | POA: Insufficient documentation

## 2016-02-03 DIAGNOSIS — Z01419 Encounter for gynecological examination (general) (routine) without abnormal findings: Secondary | ICD-10-CM | POA: Diagnosis present

## 2016-02-03 NOTE — Progress Notes (Signed)
  Subjective:     Tina Olson is a 70 y.o. woman who comes in today for a  pap smear only.  Previous abnormal Pap smears: no. Contraception: condoms.  Objective:    There were no vitals taken for this visit. Pelvic Exam:  Pap smear obtained.   Assessment:    Screening pap smear.   Plan:    Follow up in one year, or as indicated by Pap results.  Pt given educational materials re: HIV and women, self-esteem, BSE, nutrition and diet management, PAP smears and partner safety. Pt given condoms

## 2016-02-03 NOTE — Patient Instructions (Signed)
Your results will be ready in about a week.  I will mail them to you.  Thank you for coming to the Center for your care.  Denise,  RN 

## 2016-02-06 LAB — CERVICOVAGINAL ANCILLARY ONLY
CHLAMYDIA, DNA PROBE: NEGATIVE
NEISSERIA GONORRHEA: NEGATIVE

## 2016-02-06 LAB — CYTOLOGY - PAP

## 2016-02-07 ENCOUNTER — Encounter: Payer: Self-pay | Admitting: *Deleted

## 2016-10-01 ENCOUNTER — Other Ambulatory Visit: Payer: Self-pay | Admitting: Infectious Diseases

## 2016-10-01 DIAGNOSIS — Z1231 Encounter for screening mammogram for malignant neoplasm of breast: Secondary | ICD-10-CM

## 2016-10-15 ENCOUNTER — Ambulatory Visit
Admission: RE | Admit: 2016-10-15 | Discharge: 2016-10-15 | Disposition: A | Discharge status: Home / Self Care | Payer: Medicare Other | Source: Ambulatory Visit | Attending: Infectious Diseases | Admitting: Infectious Diseases

## 2016-10-15 DIAGNOSIS — Z1231 Encounter for screening mammogram for malignant neoplasm of breast: Secondary | ICD-10-CM

## 2016-10-23 ENCOUNTER — Encounter: Payer: Self-pay | Admitting: Infectious Diseases

## 2016-10-23 ENCOUNTER — Other Ambulatory Visit (HOSPITAL_COMMUNITY)
Admission: RE | Admit: 2016-10-23 | Discharge: 2016-10-23 | Disposition: A | Discharge status: Home / Self Care | Payer: Medicare Other | Source: Ambulatory Visit | Attending: Infectious Diseases | Admitting: Infectious Diseases

## 2016-10-23 ENCOUNTER — Ambulatory Visit (INDEPENDENT_AMBULATORY_CARE_PROVIDER_SITE_OTHER): Payer: Medicare Other | Admitting: Infectious Diseases

## 2016-10-23 VITALS — BP 142/80 | HR 71 | Temp 97.9°F | Ht 61.0 in | Wt 131.0 lb

## 2016-10-23 DIAGNOSIS — Z113 Encounter for screening for infections with a predominantly sexual mode of transmission: Secondary | ICD-10-CM

## 2016-10-23 DIAGNOSIS — Z79899 Other long term (current) drug therapy: Secondary | ICD-10-CM | POA: Diagnosis not present

## 2016-10-23 DIAGNOSIS — B20 Human immunodeficiency virus [HIV] disease: Secondary | ICD-10-CM | POA: Diagnosis not present

## 2016-10-23 DIAGNOSIS — I10 Essential (primary) hypertension: Secondary | ICD-10-CM

## 2016-10-23 LAB — CBC
HCT: 30.5 % — ABNORMAL LOW (ref 35.0–45.0)
HEMOGLOBIN: 9.4 g/dL — AB (ref 11.7–15.5)
MCH: 21 pg — ABNORMAL LOW (ref 27.0–33.0)
MCHC: 30.8 g/dL — AB (ref 32.0–36.0)
MCV: 68.1 fL — ABNORMAL LOW (ref 80.0–100.0)
PLATELETS: 235 10*3/uL (ref 140–400)
RBC: 4.48 MIL/uL (ref 3.80–5.10)
RDW: 19.3 % — AB (ref 11.0–15.0)
WBC: 5 10*3/uL (ref 3.8–10.8)

## 2016-10-23 LAB — COMPREHENSIVE METABOLIC PANEL
ALBUMIN: 4.3 g/dL (ref 3.6–5.1)
ALT: 7 U/L (ref 6–29)
AST: 17 U/L (ref 10–35)
Alkaline Phosphatase: 53 U/L (ref 33–130)
BILIRUBIN TOTAL: 0.4 mg/dL (ref 0.2–1.2)
BUN: 18 mg/dL (ref 7–25)
CO2: 22 mmol/L (ref 20–31)
CREATININE: 1.11 mg/dL — AB (ref 0.50–0.99)
Calcium: 9.7 mg/dL (ref 8.6–10.4)
Chloride: 102 mmol/L (ref 98–110)
GLUCOSE: 91 mg/dL (ref 65–99)
Potassium: 4 mmol/L (ref 3.5–5.3)
SODIUM: 134 mmol/L — AB (ref 135–146)
Total Protein: 8.9 g/dL — ABNORMAL HIGH (ref 6.1–8.1)

## 2016-10-23 NOTE — Assessment & Plan Note (Addendum)
She will remain on her current rx.  Hold K til we get level done.  She will call for results.  Continue to f/u with PCP

## 2016-10-23 NOTE — Assessment & Plan Note (Signed)
She is doing well Has gotten flu shot Offered/refused condoms.  rtc in 6 months Continue to watch off meds.

## 2016-10-23 NOTE — Progress Notes (Signed)
   Subjective:    Patient ID: Tina Olson, female    DOB: 06/04/1946, 70 y.o.   MRN: [id number]  HPI 70 yo F with HIV+/elite controller (can't do study as she is a hard stick), HTN and Hepatitis C (1b)- not clear if treated before at [facility]. She had normal PAP August 2014. Hep A Ab+, Hep B SAb- . Completed second series 2014.  Was started on harvoni in July 2015 for her Hep C (VL 6.5 million) and F0 elastography/ultrasound. She was not able to tolerate this (thinks she took ~ 1/2 of the rx, didn't get last bottle). Her Hep C RNA has been negative (06-27-15 and 10-04-14,) & (06-27-15)  Mammo (-) 10-15-16.  Pap normal 02-03-16.  HIV 1 RNA Quant (copies/mL)  Date Value  01/16/2016 190 (H)  06/27/2015 115 (H)  10/04/2014 406 (H)   CD4 T Cell Abs (/uL)  Date Value  01/16/2016 630  06/27/2015 720  10/04/2014 890   Has been on , Omeprazole 40mg , HCTZ 12.5mg  daily. Was given KChlor 8meq but took only 1 dose- states that she did not have blood drawn prior and wants to make sure she needs this.   Has been feeling well. Eating bananas.    Review of Systems  Constitutional: Negative for appetite change and unexpected weight change.  Respiratory: Negative for shortness of breath.   Cardiovascular: Negative for chest pain.  Gastrointestinal: Positive for constipation. Negative for diarrhea.  Genitourinary: Negative for difficulty urinating.  Neurological: Negative for headaches.  generic miralax prn.  occas muscles aches in thighs.   stays busy with caring for her bipolar son.  Uses exercise bike at home.      Objective:   Physical Exam  Constitutional: She appears well-developed and well-nourished.  HENT:  Mouth/Throat: No oropharyngeal exudate.  Eyes: EOM are normal. Pupils are equal, round, and reactive to light.  Neck: Neck supple.  Cardiovascular: Normal rate, regular rhythm and normal heart sounds.   Pulmonary/Chest: Effort normal and breath sounds normal.  Abdominal:  Soft. Bowel sounds are normal. There is no tenderness. There is no rebound.  Musculoskeletal: She exhibits no edema.  Lymphadenopathy:    She has no cervical adenopathy.       Assessment & Plan:

## 2016-10-24 LAB — URINE CYTOLOGY ANCILLARY ONLY
Chlamydia: NEGATIVE
Neisseria Gonorrhea: NEGATIVE

## 2016-10-24 LAB — T-HELPER CELL (CD4) - (RCID CLINIC ONLY)
CD4 T CELL ABS: 660 /uL (ref 400–2700)
CD4 T CELL HELPER: 31 % — AB (ref 33–55)

## 2016-10-24 LAB — RPR

## 2016-10-26 LAB — HIV-1 RNA QUANT-NO REFLEX-BLD
HIV 1 RNA Quant: 585 copies/mL — ABNORMAL HIGH (ref <20)
HIV-1 RNA Quant, Log: 2.77 Log copies/mL — ABNORMAL HIGH (ref <1.30)

## 2016-10-29 ENCOUNTER — Telehealth: Payer: Self-pay

## 2016-10-29 NOTE — Telephone Encounter (Signed)
Patient called to ask if she should be taking her potassium supplement after her lab work at her last visit with Dr. [name] on 11/14. She states prior to the lab she had not been taking her potassium supplement. Her K was 4.0 on 11/14, I advised her that this a normal range and she did not need to restart potassium supplementation.  Casilda Carlsaylor Maxamillion Banas, PharmD. PGY-2 Infectious Diseases Pharmacy Resident Pager: [phone]. 10/29/2016, 8:49 AM

## 2016-11-13 ENCOUNTER — Ambulatory Visit (INDEPENDENT_AMBULATORY_CARE_PROVIDER_SITE_OTHER): Payer: Medicare Other

## 2016-11-13 ENCOUNTER — Encounter (HOSPITAL_COMMUNITY): Payer: Self-pay | Admitting: Family Medicine

## 2016-11-13 ENCOUNTER — Ambulatory Visit (HOSPITAL_COMMUNITY)
Admission: EM | Admit: 2016-11-13 | Discharge: 2016-11-13 | Disposition: A | Discharge status: Home / Self Care | Payer: Medicare Other | Attending: Family Medicine | Admitting: Family Medicine

## 2016-11-13 DIAGNOSIS — B9789 Other viral agents as the cause of diseases classified elsewhere: Secondary | ICD-10-CM

## 2016-11-13 DIAGNOSIS — J069 Acute upper respiratory infection, unspecified: Secondary | ICD-10-CM

## 2016-11-13 MED ORDER — IPRATROPIUM BROMIDE 0.06 % NA SOLN: 2.0000 | Freq: Four times a day (QID) | NASAL | 0 refills | Status: DC

## 2016-11-13 NOTE — Discharge Instructions (Signed)
You likely have a viral upper respiratory infection. Your x-rays of your sinuses and lungs were normal. Please take Atrovent nasal spray as directed which should help with your cough as well. You can also use nasal saline as well. If you have difficulty breathing please seek immediate medical care.

## 2016-11-13 NOTE — ED Triage Notes (Signed)
Pt here for cough, fever x 1 month. sts productive cough and body aches. sts sinus congestion and pain.

## 2016-11-13 NOTE — ED Provider Notes (Signed)
CSN: [id number]     Arrival date & time 11/13/16  1001 History   None    Chief Complaint  Patient presents with  . Cough  . Fever    HPI  Tina Olson is a 70 yo female with PMH of HIV (CD4 count 660 in 10/2016; not requiring anti-retroviral medication) who notes of productive cough with white/clear phlegm rhinorrhea, nasal congestion since November 10th with some intermittent post-nasal drip. She thought it may be allergies. However, she notes of fever since Sunday with HA and feeling sore behind eyes bilaterally since Sunday. Tmax was 100F this AM. Took tylenol this morning. HA is on top of head and eye soreness. No sinus pain. No vomiting. No difficulty breathing unless nose is congested Reports of chest tightness from all the coughing. Has been using cough drops, robitussin, Flonase (which she only used this AM) Stopped taking Allegra Saturday or Sunday because she thought this was different from her allergies. No sick contacts. Has had Flu vaccine.   Past Medical History:  Diagnosis Date  . Hepatitis C   . HIV infection (HCC)   . Hypertension    History reviewed. No pertinent surgical history. Family History  Problem Relation Age of Onset  . Alzheimer's disease Mother   . Glaucoma Mother   . Hypertension Mother   . Cancer Father     ? type  . Seizures Son    Social History  Substance Use Topics  . Smoking status: Former Smoker    Types: Cigarettes    Quit date: 12/10/2000  . Smokeless tobacco: Never Used  . Alcohol use No   OB History    No data available     Review of Systems  Constitutional: Positive for fever.  HENT: Positive for congestion and rhinorrhea. Negative for ear pain, sinus pain, sinus pressure and sore throat.   Respiratory: Positive for cough and chest tightness (with coughing). Negative for shortness of breath.   Gastrointestinal: Negative for diarrhea, nausea and vomiting.  Skin: Negative for rash.  :  Allergies  Lisinopril and Other  Home  Medications   Prior to Admission medications   Medication Sig Start Date End Date Taking? Authorizing Provider  cetirizine (ZYRTEC) 10 MG tablet Take 10 mg by mouth daily.    Historical Provider, MD  Flaxseed, Linseed, (FLAX SEED OIL PO) Take 1 capsule by mouth daily.    Historical Provider, MD  hydrochlorothiazide (MICROZIDE) 12.5 MG capsule Take 12.5 mg by mouth every other day.    Historical Provider, MD  ipratropium (ATROVENT) 0.06 % nasal spray Place 2 sprays into both nostrils 4 (four) times daily. 11/13/16   Palma HolterKanishka G Feige Lowdermilk, MD  KLOR-CON SPRINKLE 8 MEQ CPCR capsule CR Take 1 capsule by mouth daily. 10/17/16   Historical Provider, MD  Omega-3 Fatty Acids (FISH OIL) 1200 MG CAPS Take 1,200 mg by mouth daily.    Historical Provider, MD  omeprazole (PRILOSEC) 40 MG capsule Take 40 mg by mouth daily.    Historical Provider, MD  polyethylene glycol (MIRALAX / GLYCOLAX) packet Take 17 g by mouth daily.    Historical Provider, MD   Meds Ordered and Administered this Visit  Medications - No data to display  BP 145/72 (BP Location: Left Arm)   Pulse 72   Temp 99.4 F (37.4 C) (Oral)   Resp 16   SpO2 96%  No data found.  Physical Exam  Constitutional: She appears well-developed and well-nourished. No distress.  HENT:  Mouth/Throat: Oropharynx is  clear and moist.  Nose: nasal turbinates boggy and erythematous without significant drainage.   Eyes: Conjunctivae are normal. Right eye exhibits no discharge. Left eye exhibits no discharge. No scleral icterus.  Neck: Neck supple.  Cardiovascular: Normal rate and regular rhythm.  Exam reveals no gallop.   No murmur heard. No chest wall tenderness  Pulmonary/Chest: Effort normal and breath sounds normal.  Somewhat coarse breath sounds near the bases bilaterally   Lymphadenopathy:    She has no cervical adenopathy.  Skin: Skin is warm and dry.  Psychiatric: She has a normal mood and affect.    Urgent Care Course   Clinical Course      Procedures  none  Labs Review Labs Reviewed - No data to display  Imaging Review Dg Sinuses Complete  Result Date: 11/13/2016 CLINICAL DATA:  Cough for 1 month EXAM: PARANASAL SINUSES - COMPLETE 3 + VIEW COMPARISON:  None. FINDINGS: The paranasal sinus are aerated. There is no evidence of sinus opacification air-fluid levels or mucosal thickening. No significant bone abnormalities are seen. IMPRESSION: Negative. Electronically Signed   By: Charlett NoseKevin  Dover M.D.   On: 11/13/2016 11:19   Dg Chest 2 View  Result Date: 11/13/2016 CLINICAL DATA:  Cough for 1 month. Fever and worsening chest congestion and shortness of breath. EXAM: CHEST  2 VIEW COMPARISON:  07/07/2012 FINDINGS: The heart size and mediastinal contours are within normal limits. Aortic atherosclerosis. Both lungs are clear. The visualized skeletal structures are unremarkable. IMPRESSION: Stable exam.  No active cardiopulmonary disease. Aortic atherosclerosis. Electronically Signed   By: Myles RosenthalJohn  Stahl M.D.   On: 11/13/2016 11:19     MDM   1. Viral upper respiratory tract infection   Symptoms likely consistent with viral URI. Chest x-ray and x-ray of sinuses are unremarkable. Patient is non-toxic in appearance. Trial Atrovent Nasal spray 2 sprays into each nare four times a day. Can also try nasal saline. Return precautions discussed.   Discussed patient's assessment and plan with Dr. Artis FlockKindl.     Palma HolterKanishka G Ainhoa Rallo, MD PGY 2 Family Medicine 11/13/16 1152    Palma HolterKanishka G Bellany Elbaum, MD 11/13/16 [phone]

## 2017-01-26 ENCOUNTER — Emergency Department (HOSPITAL_COMMUNITY): Payer: Medicare Other

## 2017-01-26 ENCOUNTER — Encounter (HOSPITAL_COMMUNITY): Payer: Self-pay | Admitting: Emergency Medicine

## 2017-01-26 ENCOUNTER — Observation Stay (HOSPITAL_COMMUNITY)
Admission: EM | Admit: 2017-01-26 | Discharge: 2017-01-27 | Disposition: A | Discharge status: Home / Self Care | Payer: Medicare Other | Attending: Internal Medicine | Admitting: Internal Medicine

## 2017-01-26 DIAGNOSIS — E785 Hyperlipidemia, unspecified: Secondary | ICD-10-CM | POA: Insufficient documentation

## 2017-01-26 DIAGNOSIS — Z7951 Long term (current) use of inhaled steroids: Secondary | ICD-10-CM | POA: Diagnosis not present

## 2017-01-26 DIAGNOSIS — D62 Acute posthemorrhagic anemia: Principal | ICD-10-CM | POA: Insufficient documentation

## 2017-01-26 DIAGNOSIS — K219 Gastro-esophageal reflux disease without esophagitis: Secondary | ICD-10-CM | POA: Diagnosis not present

## 2017-01-26 DIAGNOSIS — B1920 Unspecified viral hepatitis C without hepatic coma: Secondary | ICD-10-CM | POA: Diagnosis not present

## 2017-01-26 DIAGNOSIS — I10 Essential (primary) hypertension: Secondary | ICD-10-CM | POA: Diagnosis not present

## 2017-01-26 DIAGNOSIS — R55 Syncope and collapse: Secondary | ICD-10-CM

## 2017-01-26 DIAGNOSIS — Z87891 Personal history of nicotine dependence: Secondary | ICD-10-CM | POA: Diagnosis not present

## 2017-01-26 DIAGNOSIS — D509 Iron deficiency anemia, unspecified: Secondary | ICD-10-CM | POA: Diagnosis present

## 2017-01-26 DIAGNOSIS — D649 Anemia, unspecified: Secondary | ICD-10-CM | POA: Diagnosis not present

## 2017-01-26 DIAGNOSIS — J309 Allergic rhinitis, unspecified: Secondary | ICD-10-CM | POA: Diagnosis not present

## 2017-01-26 DIAGNOSIS — N179 Acute kidney failure, unspecified: Secondary | ICD-10-CM | POA: Insufficient documentation

## 2017-01-26 DIAGNOSIS — K5909 Other constipation: Secondary | ICD-10-CM | POA: Diagnosis not present

## 2017-01-26 DIAGNOSIS — B20 Human immunodeficiency virus [HIV] disease: Secondary | ICD-10-CM | POA: Diagnosis not present

## 2017-01-26 DIAGNOSIS — Z8249 Family history of ischemic heart disease and other diseases of the circulatory system: Secondary | ICD-10-CM | POA: Insufficient documentation

## 2017-01-26 DIAGNOSIS — D72819 Decreased white blood cell count, unspecified: Secondary | ICD-10-CM | POA: Diagnosis present

## 2017-01-26 DIAGNOSIS — R079 Chest pain, unspecified: Secondary | ICD-10-CM | POA: Insufficient documentation

## 2017-01-26 DIAGNOSIS — K5900 Constipation, unspecified: Secondary | ICD-10-CM | POA: Diagnosis present

## 2017-01-26 DIAGNOSIS — I700 Atherosclerosis of aorta: Secondary | ICD-10-CM | POA: Diagnosis not present

## 2017-01-26 DIAGNOSIS — R0602 Shortness of breath: Secondary | ICD-10-CM

## 2017-01-26 HISTORY — DX: Gastro-esophageal reflux disease without esophagitis: K21.9

## 2017-01-26 LAB — TROPONIN I

## 2017-01-26 LAB — CBC
HCT: 25.3 % — ABNORMAL LOW (ref 36.0–46.0)
HEMATOCRIT: 36.3 % (ref 36.0–46.0)
Hemoglobin: 7.7 g/dL — ABNORMAL LOW (ref 12.0–15.0)
Hemoglobin: 11.5 g/dL — ABNORMAL LOW (ref 12.0–15.0)
MCH: 20.4 pg — ABNORMAL LOW (ref 26.0–34.0)
MCH: 23.3 pg — AB (ref 26.0–34.0)
MCHC: 30.4 g/dL (ref 30.0–36.0)
MCHC: 31.7 g/dL (ref 30.0–36.0)
MCV: 67.1 fL — ABNORMAL LOW (ref 78.0–100.0)
MCV: 73.6 fL — ABNORMAL LOW (ref 78.0–100.0)
PLATELETS: 174 10*3/uL (ref 150–400)
Platelets: 165 10*3/uL (ref 150–400)
RBC: 3.77 MIL/uL — AB (ref 3.87–5.11)
RBC: 4.93 MIL/uL (ref 3.87–5.11)
RDW: 20.3 % — AB (ref 11.5–15.5)
RDW: 21.8 % — AB (ref 11.5–15.5)
WBC: 3.2 10*3/uL — AB (ref 4.0–10.5)
WBC: 4.6 10*3/uL (ref 4.0–10.5)

## 2017-01-26 LAB — FERRITIN: Ferritin: 4 ng/mL — ABNORMAL LOW (ref 11–307)

## 2017-01-26 LAB — IRON AND TIBC
IRON: 19 ug/dL — AB (ref 28–170)
Saturation Ratios: 3 % — ABNORMAL LOW (ref 10.4–31.8)
TIBC: 543 ug/dL — ABNORMAL HIGH (ref 250–450)
UIBC: 524 ug/dL

## 2017-01-26 LAB — RETICULOCYTES
RBC.: 3.85 MIL/uL — ABNORMAL LOW (ref 3.87–5.11)
RETIC COUNT ABSOLUTE: 65.5 10*3/uL (ref 19.0–186.0)
Retic Ct Pct: 1.7 % (ref 0.4–3.1)

## 2017-01-26 LAB — PREPARE RBC (CROSSMATCH)

## 2017-01-26 LAB — FOLATE: FOLATE: 18.8 ng/mL (ref >5.9)

## 2017-01-26 LAB — BASIC METABOLIC PANEL
Anion gap: 11 (ref 5–15)
BUN: 24 mg/dL — ABNORMAL HIGH (ref 6–20)
CALCIUM: 9.8 mg/dL (ref 8.9–10.3)
CO2: 22 mmol/L (ref 22–32)
CREATININE: 1.3 mg/dL — AB (ref 0.44–1.00)
Chloride: 102 mmol/L (ref 101–111)
GFR calc non Af Amer: 41 mL/min — ABNORMAL LOW (ref >60)
GFR, EST AFRICAN AMERICAN: 48 mL/min — AB (ref >60)
Glucose, Bld: 113 mg/dL — ABNORMAL HIGH (ref 65–99)
Potassium: 3.6 mmol/L (ref 3.5–5.1)
SODIUM: 135 mmol/L (ref 135–145)

## 2017-01-26 LAB — APTT: aPTT: 47 seconds — ABNORMAL HIGH (ref 24–36)

## 2017-01-26 LAB — POC OCCULT BLOOD, ED: FECAL OCCULT BLD: NEGATIVE

## 2017-01-26 LAB — PROTIME-INR
INR: 1.05
Prothrombin Time: 13.7 seconds (ref 11.4–15.2)

## 2017-01-26 LAB — TSH: TSH: 2.942 u[IU]/mL (ref 0.350–4.500)

## 2017-01-26 LAB — VITAMIN B12: Vitamin B-12: 419 pg/mL (ref 180–914)

## 2017-01-26 LAB — ABO/RH: ABO/RH(D): A POS

## 2017-01-26 MED ORDER — POLYETHYLENE GLYCOL 3350 17 G PO PACK: 17.0000 g | PACK | Freq: Every day | ORAL | Status: DC | PRN

## 2017-01-26 MED ORDER — OMEGA-3-ACID ETHYL ESTERS 1 G PO CAPS
2.0000 g | ORAL_CAPSULE | Freq: Every day | ORAL | Status: DC
  Administered 2017-01-26 – 2017-01-27 (×2): 2 g via ORAL
  Filled 2017-01-26 (×2): qty 2

## 2017-01-26 MED ORDER — LORATADINE 10 MG PO TABS
10.0000 mg | ORAL_TABLET | Freq: Every day | ORAL | Status: DC
  Administered 2017-01-26 – 2017-01-27 (×2): 10 mg via ORAL
  Filled 2017-01-26 (×2): qty 1

## 2017-01-26 MED ORDER — IPRATROPIUM BROMIDE 0.06 % NA SOLN
2.0000 | Freq: Four times a day (QID) | NASAL | Status: DC
  Administered 2017-01-26: 2 via NASAL
  Filled 2017-01-26: qty 15

## 2017-01-26 MED ORDER — FLUTICASONE PROPIONATE 50 MCG/ACT NA SUSP
1.0000 | Freq: Every day | NASAL | Status: DC
  Administered 2017-01-26: 1 via NASAL
  Filled 2017-01-26: qty 16

## 2017-01-26 MED ORDER — NITROGLYCERIN 0.4 MG SL SUBL: 0.4000 mg | SUBLINGUAL_TABLET | SUBLINGUAL | Status: DC | PRN

## 2017-01-26 MED ORDER — ACETAMINOPHEN 650 MG RE SUPP: 650.0000 mg | Freq: Four times a day (QID) | RECTAL | Status: DC | PRN

## 2017-01-26 MED ORDER — FENTANYL CITRATE (PF) 100 MCG/2ML IJ SOLN: 25.0000 ug | INTRAMUSCULAR | Status: DC | PRN

## 2017-01-26 MED ORDER — ONDANSETRON HCL 4 MG PO TABS: 4.0000 mg | ORAL_TABLET | Freq: Four times a day (QID) | ORAL | Status: DC | PRN

## 2017-01-26 MED ORDER — PANTOPRAZOLE SODIUM 40 MG PO TBEC
40.0000 mg | DELAYED_RELEASE_TABLET | Freq: Every day | ORAL | Status: DC
  Administered 2017-01-26 – 2017-01-27 (×2): 40 mg via ORAL
  Filled 2017-01-26 (×2): qty 1

## 2017-01-26 MED ORDER — ACETAMINOPHEN 325 MG PO TABS: 650.0000 mg | ORAL_TABLET | Freq: Four times a day (QID) | ORAL | Status: DC | PRN

## 2017-01-26 MED ORDER — SODIUM CHLORIDE 0.9 % IV SOLN
10.0000 mL/h | Freq: Once | INTRAVENOUS | Status: AC
  Administered 2017-01-26: 10 mL/h via INTRAVENOUS

## 2017-01-26 MED ORDER — ONDANSETRON HCL 4 MG/2ML IJ SOLN: 4.0000 mg | Freq: Four times a day (QID) | INTRAMUSCULAR | Status: DC | PRN

## 2017-01-26 NOTE — ED Provider Notes (Signed)
Procedure note: Ultrasound Guided Peripheral IV Ultrasound guided peripheral 1.88 inch angiocath IV placement performed by me. Indications: Nursing unable to place IV. Details: The antecubital fossa and upper arm were evaluated with a multifrequency linear probe. Patent brachial veins were noted. 1 attempt was made to cannulate a vein under realtime US guidance with successful cannulation of the vein and catheter placement. There is return of non-pulsatile dark red blood. The patient tolerated the procedure well without complications. Images archived electronically.  CPT codes: 1610976937 and [id number]    Melene PlanDan Shontell Prosser, DO 01/26/17 [phone]

## 2017-01-26 NOTE — ED Notes (Signed)
Lunch tray at bedside. ?

## 2017-01-26 NOTE — ED Notes (Signed)
Breakfast tray set up at bedside  

## 2017-01-26 NOTE — ED Notes (Signed)
Mini-lab Morrie Sheldon(Ashley) called and states I-stat Trop will not run- reading error.

## 2017-01-26 NOTE — H&P (Signed)
History and Physical    Tina Olson:[id number] DOB: [birth year] DOA: 01/26/2017   PCP: Zachery Dauer, FNP [facility]  Patient coming from/Resides with: Private residence  Admission status: Observation/telemetry -it may be medically necessary to stay a minimum 2 midnights to rule out impending and/or unexpected changes in physiologic status that may differ from initial evaluation performed in the ER and/or at time of admission before consider reevaluation of admission status in 24 hours.   Chief Complaint: Dizziness shortness of breath 2 weeks  HPI: Tina Olson is a 71 y.o. female with medical history significant for 042 positive status/[employer], hypertension, seasonal allergies and chronic leukopenia. Also history of chronic constipation. Patient reports shortness of breath with chest tightness with activity as well as dizziness with standing for 2 weeks. No vaginal bleeding, no dark or bloody stools, has been taking low-dose 80 mg aspirin periodically for a month. Ran out of Prilosec several weeks ago (needed RF) and is to reestablish with new physician (was unable to afford co-pay had previous physician office). In the ER was found to have a new anemia. Hemoccult negative. EKG unremarkable.  ED Course:  Vital Signs: BP 128/76   Pulse (!) 56   Temp 98.3 F (36.8 C) (Oral)   Resp 12   SpO2 100%  2 view CXR: Negative Lab data: Sodium 135, potassium 3.6, chloride 12, CO2 22, glucose 113, BUN 24, creatinine 1.3, troponin less than 0.03, white count 3200 differential not obtained, hemoglobin 7.7, MCV 67.1, platelets 174,000, coags normal For slightly elevated PTT at 47. Medications and treatments: 1 unit PRBCs ordered by EDP, #20-gauge peripheral IV placed via ultrasound by EDP during my evaluation patient  Review of Systems:  In addition to the HPI above,  No Fever-chills, myalgias or other constitutional symptoms No Headache, changes with Vision or  hearing, new weakness, tingling, numbness in any extremity,dysarthria or word finding difficulty, gait disturbance or imbalance, tremors or seizure activity No problems swallowing food or Liquids, indigestion/reflux, choking or coughing while eating, abdominal pain with or after eating No Chest pain, Cough, palpitations, orthopnea  No Abdominal pain, N/V, melena,hematochezia, dark tarry stools No dysuria, malodorous urine, hematuria or flank pain No new skin rashes, lesions, masses or bruises, No new joint pains, aches, swelling or redness No recent unintentional weight gain or loss No polyuria, polydypsia or polyphagia   Past Medical History:  Diagnosis Date  . Acid reflux   . Hepatitis C   . HIV infection (HCC)   . Hypertension     History reviewed. No pertinent surgical history.  Social History   Social History  . Marital status: Widowed    Spouse name: N/A  . Number of children: N/A  . Years of education: N/A   Occupational History  . Not on file.   Social History Main Topics  . Smoking status: Former Smoker    Types: Cigarettes    Quit date: 12/10/2000  . Smokeless tobacco: Never Used  . Alcohol use No  . Drug use: No     Comment: clean since around 2000  . Sexual activity: No     Comment: pt. declined condoms   Other Topics Concern  . Not on file   Social History Narrative  . No narrative on file    Mobility: Without assistive devices Work history: Retired   Allergies  Allergen Reactions  . Lisinopril Itching and Cough  . Other Hives and Itching     Seafood  crustaceans  "itchy throat"    Family History  Problem Relation Age of Onset  . Alzheimer's disease Mother   . Glaucoma Mother   . Hypertension Mother   . Cancer Father and brother (unknown type)     ? type  . Seizures Son      Prior to Admission medications   Medication Sig Start Date End Date Taking? Authorizing Provider  cetirizine (ZYRTEC) 10 MG tablet Take 10 mg by mouth daily as  needed for allergies.    Yes Historical Provider, MD  fluticasone (FLONASE) 50 MCG/ACT nasal spray Place into both nostrils daily as needed for allergies or rhinitis.   Yes Historical Provider, MD  hydrochlorothiazide (MICROZIDE) 12.5 MG capsule Take 12.5 mg by mouth every other day.   Yes Historical Provider, MD  ipratropium (ATROVENT) 0.06 % nasal spray Place 2 sprays into both nostrils 4 (four) times daily. Patient taking differently: Place 2 sprays into both nostrils 4 (four) times daily as needed for rhinitis.  11/13/16  Yes Palma Holter, MD  Omega-3 Fatty Acids (FISH OIL) 1200 MG CAPS Take 1,200 mg by mouth daily.   Yes Historical Provider, MD  omeprazole (PRILOSEC) 40 MG capsule Take 40 mg by mouth daily.   Yes Historical Provider, MD  polyethylene glycol (MIRALAX / GLYCOLAX) packet Take 17 g by mouth daily as needed for mild constipation.    Yes Historical Provider, MD    Physical Exam: Vitals:   01/26/17 0751 01/26/17 0800 01/26/17 0811 01/26/17 0812  BP: 144/70 140/72  129/77  Pulse: (!) 56   (!) 52  Resp: 16   18  Temp:   97.8 F (36.6 C) 97.8 F (36.6 C)  TempSrc:   Oral Oral  SpO2: 100% 100%  100%      Constitutional: NAD, calm, comfortable Eyes: PERRL, lids normal-Conjunctiva pale ENMT: Mucous membranes are moist. Posterior pharynx clear of any exudate or lesions.Normal dentition.  Neck: normal, supple, no masses, no thyromegaly Respiratory: clear to auscultation bilaterally, no wheezing, no crackles. Normal respiratory effort. No accessory muscle use.  Cardiovascular: Regular rate and rhythm, no murmurs / rubs / gallops. No extremity edema. 2+ pedal pulses. No carotid bruits.  Abdomen: no tenderness, no masses palpated. No hepatosplenomegaly. Bowel sounds positive.  Musculoskeletal: no clubbing / cyanosis. No joint deformity upper and lower extremities. Good ROM, no contractures. Normal muscle tone.  Skin: no rashes, lesions, ulcers. No induration Neurologic: CN  2-12 grossly intact. Sensation intact, DTR normal. Strength 5/5 x all 4 extremities.  Psychiatric: Normal judgment and insight. Alert and oriented x 3. Normal mood.    Labs on Admission: I have personally reviewed following labs and imaging studies  CBC:  Recent Labs Lab 01/26/17 0133  WBC 3.2*  HGB 7.7*  HCT 25.3*  MCV 67.1*  PLT 174   Basic Metabolic Panel:  Recent Labs Lab 01/26/17 0133  NA 135  K 3.6  CL 102  CO2 22  GLUCOSE 113*  BUN 24*  CREATININE 1.30*  CALCIUM 9.8   GFR: CrCl cannot be calculated (Unknown ideal weight.). Liver Function Tests: No results for input(s): AST, ALT, ALKPHOS, BILITOT, PROT, ALBUMIN in the last 168 hours. No results for input(s): LIPASE, AMYLASE in the last 168 hours. No results for input(s): AMMONIA in the last 168 hours. Coagulation Profile:  Recent Labs Lab 01/26/17 0554  INR 1.05   Cardiac Enzymes:  Recent Labs Lab 01/26/17 0133  TROPONINI <0.03   BNP (last 3 results) No results for  input(s): PROBNP in the last 8760 hours. HbA1C: No results for input(s): HGBA1C in the last 72 hours. CBG: No results for input(s): GLUCAP in the last 168 hours. Lipid Profile: No results for input(s): CHOL, HDL, LDLCALC, TRIG, CHOLHDL, LDLDIRECT in the last 72 hours. Thyroid Function Tests: No results for input(s): TSH, T4TOTAL, FREET4, T3FREE, THYROIDAB in the last 72 hours. Anemia Panel:  Recent Labs  01/26/17 0640 01/26/17 0641  VITAMINB12 419  --   FOLATE  --  18.8  FERRITIN 4*  --   TIBC 543*  --   IRON 19*  --   RETICCTPCT 1.7  --    Urine analysis:    Component Value Date/Time   COLORURINE YELLOW 10/25/2014 1514   APPEARANCEUR CLEAR 10/25/2014 1514   LABSPEC 1.013 10/25/2014 1514   PHURINE 7.0 10/25/2014 1514   GLUCOSEU NEG 10/25/2014 1514   HGBUR NEG 10/25/2014 1514   BILIRUBINUR NEG 10/25/2014 1514   KETONESUR NEG 10/25/2014 1514   PROTEINUR NEG 10/25/2014 1514   UROBILINOGEN 0.2 10/25/2014 1514    NITRITE NEG 10/25/2014 1514   LEUKOCYTESUR NEG 10/25/2014 1514   Sepsis Labs: @LABRCNTIP (procalcitonin:4,lacticidven:4) )No results found for this or any previous visit (from the past 240 hour(s)).   Radiological Exams on Admission: Dg Chest 2 View  Result Date: 01/26/2017 CLINICAL DATA:  Initial evaluation for acute shortness of breath. EXAM: CHEST  2 VIEW COMPARISON:  Prior radiograph from 11/13/2016. FINDINGS: Cardiac and mediastinal silhouettes are stable. Aortic atherosclerosis noted. Lungs normally inflated. Minimal patchy density at the mid left lung base favored to reflect atelectasis. No other focal infiltrates. No pulmonary edema or pleural effusion. No pneumothorax. No acute osseous abnormality. IMPRESSION: 1. Mild left basilar atelectasis. No other active cardiopulmonary disease identified. 2. Aortic atherosclerosis. Electronically Signed   By: Rise MuBenjamin  McClintock M.D.   On: 01/26/2017 02:22    EKG: (Independently reviewed) sinus rhythm with ventricular rate 65 bpm, QTC 400 ms, nonspecific ST changes in inferior lateral leads but unchanged since previous EKG  Assessment/Plan Principal Problem:   Symptomatic anemia -Patient presents with shortness of breath and dizziness ongoing for 2 weeks with clinical findings of new anemia with symptoms suspected related to anemia -Anemia is microcytic and anemia panel has returned with low iron 19 -Check TSH -With symptoms we'll transfuse 2 units packed red blood cells and repeat CBC after transfusion and again in a.m. -Continue PPI -Continue to check stools for blood -Last colonoscopy in 2008 and will need to be set up for outpatient screening colonoscopy -Has poor IV access so we'll transfuse blood today and then if indicated patient can receive IV iron tomorrow -History of severe constipation so likely will need to be on multiple stool softeners/laxative agents if requires regular oral iron -Other malignancy screenings: Mammogram  (November 2017) and Pap smear (February 2017) both normal  Active Problems:   Acute kidney injury  -Baseline: 18/1.11 -Current: 24/1.30 -Secondary to hypoperfusion from symptomatic anemia as well as concomitant use of thiazide diuretic -Correct anemia -Hold diuretic -Follow labs    Hypertension -Currently controlled -Hold HCTZ -Given issues with recurrent constipation may need to choose alternative med    Chronic leukopenia -Likely related to HIV status versus ethnicity -Wbc's are stable for this patient    Human immunodeficiency virus (HIV) disease [employer]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 Rowland LatheHatcher/ID November 2017 -Not on ART -Documented as prior history of hepatitis C but intolerant to hard bony was last hep C RNA negative in 2015 and 2016  Constipation -On when necessary MiraLAX at home-iron added may need to increase to daily and add twice a day Colace    Dyslipidemia -Continue omega-3 fatty acids   DVT prophylaxis: SCDs  Code Status: Full Family Communication: No family at bedside Disposition Plan: Anticipate discharge back to preadmission home environment when medically stable Consults called: None    ELLIS,ALLISON L. ANP-BC [facility] Pager [phone]   If 7PM-7AM, please contact night-coverage [email or web address] Password [email or web address]  01/26/2017, 8:22 AM

## 2017-01-26 NOTE — Progress Notes (Signed)
Pt arrived to the unit, placed on tele. Oriented to the unit.

## 2017-01-26 NOTE — ED Notes (Signed)
Breakfast tray ordered for patient.

## 2017-01-26 NOTE — ED Provider Notes (Signed)
TIME SEEN: 5:30 AM  CHIEF COMPLAINT: Shortness of breath, chest pain, near syncope  HPI: Pt is a 71 y.o. female with history of hypertension, previous tobacco use who quit in 2008, HIV and hepatitis C who presents to the emergency department with complaints of shortness of breath for the past 1-2 weeks worse with exertion and now, at rest. Did have some chest discomfort last night that she describes as a pressure. This has improved. States she did fill and she was going to pass out. No recent nausea, vomiting. No bloody stools or melena. No vaginal bleeding. Found to be anemic here in the emergency department. She states she's been told she has been anemic before but never needed a blood transfusion. 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  ROS: See HPI Constitutional: no fever  Eyes: no drainage  ENT: no runny nose   Cardiovascular:   chest pain  Resp:  SOB  GI: no vomiting GU: no dysuria Integumentary: no rash  Allergy: no hives  Musculoskeletal: no leg swelling  Neurological: no slurred speech ROS otherwise negative  PAST MEDICAL HISTORY/PAST SURGICAL HISTORY:  Past Medical History:  Diagnosis Date  . Acid reflux   . Hepatitis C   . HIV infection (HCC)   . Hypertension     MEDICATIONS:  Prior to Admission medications   Medication Sig Start Date End Date Taking? Authorizing Provider  cetirizine (ZYRTEC) 10 MG tablet Take 10 mg by mouth daily as needed for allergies.    Yes Historical Provider, MD  fluticasone (FLONASE) 50 MCG/ACT nasal spray Place into both nostrils daily as needed for allergies or rhinitis.   Yes Historical Provider, MD  hydrochlorothiazide (MICROZIDE) 12.5 MG capsule Take 12.5 mg by mouth every other day.   Yes Historical Provider, MD  ipratropium (ATROVENT) 0.06 % nasal spray Place 2 sprays into both nostrils 4 (four) times daily. Patient taking differently: Place 2 sprays into both nostrils 4 (four) times daily as  needed for rhinitis.  11/13/16  Yes Palma HolterKanishka G Gunadasa, MD  Omega-3 Fatty Acids (FISH OIL) 1200 MG CAPS Take 1,200 mg by mouth daily.   Yes Historical Provider, MD  omeprazole (PRILOSEC) 40 MG capsule Take 40 mg by mouth daily.   Yes Historical Provider, MD  polyethylene glycol (MIRALAX / GLYCOLAX) packet Take 17 g by mouth daily as needed for mild constipation.    Yes Historical Provider, MD    ALLERGIES:  Allergies  Allergen Reactions  . Lisinopril Itching and Cough  . Other Hives and Itching     Seafood crustaceans  "itchy throat"    SOCIAL HISTORY:  Social History  Substance Use Topics  . Smoking status: Former Smoker    Types: Cigarettes    Quit date: 12/10/2000  . Smokeless tobacco: Never Used  . Alcohol use No    FAMILY HISTORY: Family History  Problem Relation Age of Onset  . Alzheimer's disease Mother   . Glaucoma Mother   . Hypertension Mother   . Cancer Father     ? type  . Seizures Son     EXAM: BP 119/66 (BP Location: Left Arm)   Pulse (!) 56   Temp 98.3 F (36.8 C) (Oral)   Resp 19   SpO2 100%  CONSTITUTIONAL: Alert and oriented and responds appropriately to questions. Well-appearing; well-nourished HEAD: Normocephalic EYES: Conjunctivae clear, PERRL, EOMI ENT: normal nose; no rhinorrhea; moist mucous membranes NECK: Supple, no meningismus, no  nuchal rigidity, no LAD  CARD: RRR; S1 and S2 appreciated; no murmurs, no clicks, no rubs, no gallops RESP: Normal chest excursion without splinting or tachypnea; breath sounds clear and equal bilaterally; no wheezes, no rhonchi, no rales, no hypoxia or respiratory distress, speaking full sentences ABD/GI: Normal bowel sounds; non-distended; soft, non-tender, no rebound, no guarding, no peritoneal signs, no hepatosplenomegaly RECTAL:  Normal rectal tone, no gross blood or melena, guaiac negative, no hemorrhoids appreciated, nontender rectal exam, no fecal impaction BACK:  The back appears normal and is non-tender  to palpation, there is no CVA tenderness EXT: Normal ROM in all joints; non-tender to palpation; no edema; normal capillary refill; no cyanosis, no calf tenderness or swelling    SKIN: Normal color for age and race; warm; no rash NEURO: Moves all extremities equally PSYCH: The patient's mood and manner are appropriate. Grooming and personal hygiene are appropriate.  MEDICAL DECISION MAKING: Patient here with what I think is symptomatic anemia. Hemoglobin here is 7.7 which is down from 9.5 in November. No signs of bleeding. Pt is guaiac negative here. I feel she will need a blood transfusion. Her troponin here is negative.  Chest x-ray is clear. No sign of volume overload on exam. I feel that she may need admission for serial cardiac enzymes if her symptoms do not improve after transfusion. Patient is comfortable with plan for transfusion has been consented. We'll discuss with medicine for admission.  ED PROGRESS:    6:10 AM  D/w Dr. Antionette Char with Hospitalist service who agrees to see patient and admit. Patient updated with plan.   I reviewed all nursing notes, vitals, pertinent old records, EKGs, labs, imaging (as available).     EKG Interpretation  Date/Time:  Saturday January 26 2017 01:25:45 EST Ventricular Rate:  65 PR Interval:  156 QRS Duration: 80 QT Interval:  394 QTC Calculation: 409 R Axis:   26 Text Interpretation:  Normal sinus rhythm Nonspecific ST and T wave abnormality Abnormal ECG No significant change since last tracing Confirmed by Jotham Ahn,  DO, Shavonta Gossen [phone]) on 01/26/2017 5:03:54 AM         Layla Maw Maribell Demeo, DO 01/26/17 6213

## 2017-01-26 NOTE — ED Notes (Signed)
Junious SilkAllison Ellis at bedside

## 2017-01-26 NOTE — ED Notes (Signed)
Lunch tray ordered 

## 2017-01-26 NOTE — ED Triage Notes (Signed)
C/o sob x 1 week that is worse today.  Also reports dizziness when she bends over.  Denies pain.  Speaking in complete sentences.  NAD.

## 2017-01-26 NOTE — ED Notes (Signed)
Iv team advised this RN two different RN's attempted to site iv with no success, dr Adela Lankfloyd at bedside to attempt iv

## 2017-01-26 NOTE — Progress Notes (Signed)
Patient had no complaints, vital signs stable after blood transfusions.

## 2017-01-27 DIAGNOSIS — D649 Anemia, unspecified: Secondary | ICD-10-CM

## 2017-01-27 LAB — TYPE AND SCREEN
BLOOD PRODUCT EXPIRATION DATE: 201803092359
Blood Product Expiration Date: 201803092359
ISSUE DATE / TIME: 201802171153
ISSUE DATE / TIME: 201802170803
UNIT TYPE AND RH: 6200
Unit Type and Rh: 6200

## 2017-01-27 LAB — CBC
HCT: 36.6 % (ref 36.0–46.0)
HEMOGLOBIN: 11.6 g/dL — AB (ref 12.0–15.0)
MCH: 23.1 pg — ABNORMAL LOW (ref 26.0–34.0)
MCHC: 31.7 g/dL (ref 30.0–36.0)
MCV: 72.9 fL — ABNORMAL LOW (ref 78.0–100.0)
Platelets: 157 10*3/uL (ref 150–400)
RBC: 5.02 MIL/uL (ref 3.87–5.11)
RDW: 21.6 % — ABNORMAL HIGH (ref 11.5–15.5)
WBC: 3.8 10*3/uL — AB (ref 4.0–10.5)

## 2017-01-27 LAB — COMPREHENSIVE METABOLIC PANEL
ALK PHOS: 43 U/L (ref 38–126)
ALT: 11 U/L — AB (ref 14–54)
ANION GAP: 9 (ref 5–15)
AST: 23 U/L (ref 15–41)
Albumin: 3.6 g/dL (ref 3.5–5.0)
BUN: 21 mg/dL — ABNORMAL HIGH (ref 6–20)
CALCIUM: 9.8 mg/dL (ref 8.9–10.3)
CHLORIDE: 108 mmol/L (ref 101–111)
CO2: 19 mmol/L — ABNORMAL LOW (ref 22–32)
CREATININE: 1 mg/dL (ref 0.44–1.00)
GFR, EST NON AFRICAN AMERICAN: 57 mL/min — AB (ref >60)
Glucose, Bld: 96 mg/dL (ref 65–99)
Potassium: 4.3 mmol/L (ref 3.5–5.1)
Sodium: 136 mmol/L (ref 135–145)
Total Bilirubin: 0.9 mg/dL (ref 0.3–1.2)
Total Protein: 8 g/dL (ref 6.5–8.1)

## 2017-01-27 NOTE — Discharge Instructions (Signed)
Anemia, Nonspecific Anemia is a condition in which the concentration of red blood cells or hemoglobin in the blood is below normal. Hemoglobin is a substance in red blood cells that carries oxygen to the tissues of the body. Anemia results in not enough oxygen reaching these tissues. What are the causes? Common causes of anemia include:  Excessive bleeding. Bleeding may be internal or external. This includes excessive bleeding from periods (in women) or from the intestine.  Poor nutrition.  Chronic kidney, thyroid, and liver disease.  Bone marrow disorders that decrease red blood cell production.  Cancer and treatments for cancer.  HIV, AIDS, and their treatments.  Spleen problems that increase red blood cell destruction.  Blood disorders.  Excess destruction of red blood cells due to infection, medicines, and autoimmune disorders. What are the signs or symptoms?  Minor weakness.  Dizziness.  Headache.  Palpitations.  Shortness of breath, especially with exercise.  Paleness.  Cold sensitivity.  Indigestion.  Nausea.  Difficulty sleeping.  Difficulty concentrating. Symptoms may occur suddenly or they may develop slowly. How is this diagnosed? Additional blood tests are often needed. These help your health care provider determine the best treatment. Your health care provider will check your stool for blood and look for other causes of blood loss. How is this treated? Treatment varies depending on the cause of the anemia. Treatment can include:  Supplements of iron, vitamin B12, or folic acid.  Hormone medicines.  A blood transfusion. This may be needed if blood loss is severe.  Hospitalization. This may be needed if there is significant continual blood loss.  Dietary changes.  Spleen removal. Follow these instructions at home: Keep all follow-up appointments. It often takes many weeks to correct anemia, and having your health care provider check on your  condition and your response to treatment is very important. Get help right away if:  You develop extreme weakness, shortness of breath, or chest pain.  You become dizzy or have trouble concentrating.  You develop heavy vaginal bleeding.  You develop a rash.  You have bloody or black, tarry stools.  You faint.  You vomit up blood.  You vomit repeatedly.  You have abdominal pain.  You have a fever or persistent symptoms for more than 2-3 days.  You have a fever and your symptoms suddenly get worse.  You are dehydrated. This information is not intended to replace advice given to you by your health care provider. Make sure you discuss any questions you have with your health care provider. Document Released: 01/03/2005 Document Revised: 05/09/2016 Document Reviewed: 05/22/2013 Elsevier Interactive Patient Education  2017 [employer]  

## 2017-01-27 NOTE — Discharge Summary (Signed)
Physician Discharge Summary  Tina Olson:[id number] DOB: [birth year] DOA: 01/26/2017  PCP: Zachery Dauer, FNP  Admit date: 01/26/2017 Discharge date: 01/27/2017  Recommendations for Outpatient Follow-up:  1. Pt will need to follow up with PCP in 1-2 weeks post discharge 2. Please obtain BMP to evaluate electrolytes and kidney function 3. Please also check CBC to evaluate Hg and Hct levels  Discharge Diagnoses:  Principal Problem:   Symptomatic anemia Active Problems:   Human immunodeficiency virus (HIV) disease Armed forces logistics/support/administrative officer)   Hypertension   Acute kidney injury (HCC)   Chronic leukopenia   Constipation  Discharge Condition: Stable  Diet recommendation: Heart healthy diet discussed in details   History of present illness:  71 y.o. female with medical history significant for 042 positive status, hypertension, seasonal allergies and chronic leukopenia. Also history of chronic constipation. Patient presented with shortness of breath and chest tightness with activity as well as dizziness with standing for 2 weeks. No vaginal bleeding, no dark or bloody stools, has been taking low-dose 80 mg aspirin periodically for a month.  Hospital Course:   Principal Problem:   Symptomatic anemia - suspect GI source but not clear - pt reports feeling better this AM, wants to go home - GI on call recommended outpatient follow up as Hg is stable for now - will likely need colonoscopy as pt says she has not had one done   Active Problems:   Human immunodeficiency virus (HIV) disease (Elite Controller) - stable for now, outpatient follow up recommended     Acute kidney injury (HCC) - in the setting of acute blood loss anemia - resolved with volume resuscitation     Chronic leukopenia - stable, no fevers, outpatient follow up recommended   Procedures/Studies: Dg Chest 2 View  Result Date: 01/26/2017 CLINICAL DATA:  Initial evaluation for acute shortness of breath. EXAM: CHEST  2  VIEW COMPARISON:  Prior radiograph from 11/13/2016. FINDINGS: Cardiac and mediastinal silhouettes are stable. Aortic atherosclerosis noted. Lungs normally inflated. Minimal patchy density at the mid left lung base favored to reflect atelectasis. No other focal infiltrates. No pulmonary edema or pleural effusion. No pneumothorax. No acute osseous abnormality. IMPRESSION: 1. Mild left basilar atelectasis. No other active cardiopulmonary disease identified. 2. Aortic atherosclerosis. Electronically Signed   By: Rise Mu M.D.   On: 01/26/2017 02:22     Discharge Exam: Vitals:   01/26/17 2355 01/27/17 0404  BP: 131/67 123/66  Pulse: 60 (!) 55  Resp: 18 18  Temp: 98.1 F (36.7 C) 98.4 F (36.9 C)   Vitals:   01/26/17 1530 01/26/17 1943 01/26/17 2355 01/27/17 0404  BP: 114/65 (!) 106/52 131/67 123/66  Pulse: 61 61 60 (!) 55  Resp: 16 17 18 18   Temp: 98.9 F (37.2 C) 98.3 F (36.8 C) 98.1 F (36.7 C) 98.4 F (36.9 C)  TempSrc: Oral Oral Oral Oral  SpO2: 100% 99% 98% 99%  Weight:    58.1 kg (128 lb)  Height:        General: Pt is alert, follows commands appropriately, not in acute distress Cardiovascular: Regular rate and rhythm, S1/S2 +, no murmurs, no rubs, no gallops Respiratory: Clear to auscultation bilaterally, no wheezing, no crackles, no rhonchi Abdominal: Soft, non tender, non distended, bowel sounds +, no guarding  Discharge Instructions  Discharge Instructions    Diet - low sodium heart healthy    Complete by:  As directed    Increase activity slowly    Complete by:  As directed      Allergies as of 01/27/2017      Reactions   Lisinopril Itching, Cough   Other Hives, Itching    Seafood crustaceans  "itchy throat"      Medication List    TAKE these medications   cetirizine 10 MG tablet Commonly known as:  ZYRTEC Take 10 mg by mouth daily as needed for allergies.   Fish Oil 1200 MG Caps Take 1,200 mg by mouth daily.   fluticasone 50 MCG/ACT  nasal spray Commonly known as:  FLONASE Place into both nostrils daily as needed for allergies or rhinitis.   hydrochlorothiazide 12.5 MG capsule Commonly known as:  MICROZIDE Take 12.5 mg by mouth every other day.   ipratropium 0.06 % nasal spray Commonly known as:  ATROVENT Place 2 sprays into both nostrils 4 (four) times daily. What changed:  when to take this  reasons to take this   omeprazole 40 MG capsule Commonly known as:  PRILOSEC Take 40 mg by mouth daily.   polyethylene glycol packet Commonly known as:  MIRALAX / GLYCOLAX Take 17 g by mouth daily as needed for mild constipation.      Follow-up Information    Zachery Daueronna S Odem, FNP Follow up.   Specialty:  Nurse Practitioner Contact information: [address] [place] [address] [phone]        Johny SaxJeffrey Hatcher, MD .   Specialty:  Infectious Diseases Contact information: [address] [place] [address] [phone]            The results of significant diagnostics from this hospitalization (including imaging, microbiology, ancillary and laboratory) are listed below for reference.     Microbiology: No results found for this or any previous visit (from the past 240 hour(s)).   Labs: Basic Metabolic Panel:  Recent Labs Lab 01/26/17 0133 01/27/17 0254  NA 135 136  K 3.6 4.3  CL 102 108  CO2 22 19*  GLUCOSE 113* 96  BUN 24* 21*  CREATININE 1.30* 1.00  CALCIUM 9.8 9.8   Liver Function Tests:  Recent Labs Lab 01/27/17 0254  AST 23  ALT 11*  ALKPHOS 43  BILITOT 0.9  PROT 8.0  ALBUMIN 3.6   CBC:  Recent Labs Lab 01/26/17 0133 01/26/17 1828 01/27/17 0254  WBC 3.2* 4.6 3.8*  HGB 7.7* 11.5* 11.6*  HCT 25.3* 36.3 36.6  MCV 67.1* 73.6* 72.9*  PLT 174 165 157   Cardiac Enzymes:  Recent Labs Lab 01/26/17 0133  TROPONINI <0.03   SIGNED: Time coordinating discharge: 30 minutes  Debbora PrestoMAGICK-MYERS, ISKRA, MD  [facility] 01/27/2017, 9:17 AM Pager  [phone]  If 7PM-7AM, please contact night-coverage [email or web address] Password [email or web address]

## 2017-03-21 ENCOUNTER — Encounter: Payer: Self-pay | Admitting: Nurse Practitioner

## 2017-04-01 ENCOUNTER — Encounter: Payer: Self-pay | Admitting: Nurse Practitioner

## 2017-04-01 ENCOUNTER — Encounter (INDEPENDENT_AMBULATORY_CARE_PROVIDER_SITE_OTHER): Payer: Self-pay

## 2017-04-01 ENCOUNTER — Ambulatory Visit (INDEPENDENT_AMBULATORY_CARE_PROVIDER_SITE_OTHER): Payer: Medicare Other | Admitting: Nurse Practitioner

## 2017-04-01 ENCOUNTER — Other Ambulatory Visit (INDEPENDENT_AMBULATORY_CARE_PROVIDER_SITE_OTHER): Payer: Medicare Other

## 2017-04-01 VITALS — BP 148/66 | HR 89 | Ht 60.0 in | Wt 132.0 lb

## 2017-04-01 DIAGNOSIS — K219 Gastro-esophageal reflux disease without esophagitis: Secondary | ICD-10-CM | POA: Diagnosis not present

## 2017-04-01 DIAGNOSIS — D509 Iron deficiency anemia, unspecified: Secondary | ICD-10-CM

## 2017-04-01 DIAGNOSIS — D508 Other iron deficiency anemias: Secondary | ICD-10-CM

## 2017-04-01 LAB — CBC
HEMATOCRIT: 34.1 % — AB (ref 36.0–46.0)
Hemoglobin: 11.1 g/dL — ABNORMAL LOW (ref 12.0–15.0)
MCHC: 32.4 g/dL (ref 30.0–36.0)
MCV: 75.6 fl — AB (ref 78.0–100.0)
PLATELETS: 120 10*3/uL — AB (ref 150.0–400.0)
RBC: 4.52 Mil/uL (ref 3.87–5.11)
RDW: 23.1 % — ABNORMAL HIGH (ref 11.5–15.5)
WBC: 4 10*3/uL (ref 4.0–10.5)

## 2017-04-01 NOTE — Patient Instructions (Signed)
If you are age 69 or older, your body mass index should be between 23-30. Your Body mass index is 25.78 kg/m. If this is out of the aforementioned range listed, please consider follow up with your Primary Care Provider.  If you are age 47 or younger, your body mass index should be between 19-25. Your Body mass index is 25.78 kg/m. If this is out of the aformentioned range listed, please consider follow up with your Primary Care Provider.   You have been scheduled for an endoscopy and colonoscopy. Please follow the written instructions given to you at your visit today. Please pick up your prep supplies at the pharmacy within the next 1-3 days. If you use inhalers (even only as needed), please bring them with you on the day of your procedure. Your physician has requested that you go to www.startemmi.com and enter the access code given to you at your visit today. This web site gives a general overview about your procedure. However, you should still follow specific instructions given to you by our office regarding your preparation for the procedure.  You may eat a light Breakfast (toast and eggs or chicken noodle soup with crackers on the day before your procedure. NOT the day of you procedure.  Your physician has requested that you go to the basement for the following lab work before leaving today: CBC  Thank you for choosing me and [facility].  Willette Cluster, NP

## 2017-04-01 NOTE — Progress Notes (Addendum)
ADDENDUM: I head back from Tina Olson at [facility]. I had asked him to review case .Marland KitchenMarland KitchenPatient should be done at the hospital since such a hard stick. Will ask CMA to help arrange       HPI: Patient is a 71 year old female with HCV / HIV followed by ID. She was unable to tolerate Harvoni,  Her HCV RNA is undetected and she is an Engineer, agricultural and doesn't take HIV meds.  Patient had a colonoscopy by Dr. Corinda Gubler 10 or so years ago, results are not available.  Patient was hospitalized in Feb with symptomatic anemia, received two units of blood for a hgb of 7.7. She had been taking baby asa periodically for a month prior. Hospitalist spoke with GI on call who recommended outpatient GI workup up. Most recent hgb Feb 18th was 11.6. Patient never had any overt GI bleeding, stool was heme negative in the hospital. No vaginal bleeding. She has chronic constipation and a hx of GERD, asymptomatic on daily PPI.   Past Medical History:  Diagnosis Date  . Acid reflux   . Anemia   . Hepatitis C   . HIV infection (HCC)   . Hypertension     Past Surgical History:  Procedure Laterality Date  . CATARACT EXTRACTION Right 2016   Family History  Problem Relation Age of Onset  . Alzheimer's disease Mother   . Glaucoma Mother   . Hypertension Mother   . Cancer Father     ? type  . Seizures Son    Social History  Substance Use Topics  . Smoking status: Former Smoker    Types: Cigarettes    Quit date: 12/10/2000  . Smokeless tobacco: Never Used  . Alcohol use No   Current Outpatient Prescriptions  Medication Sig Dispense Refill  . cetirizine (ZYRTEC) 10 MG tablet Take 10 mg by mouth daily as needed for allergies.     . fluticasone (FLONASE) 50 MCG/ACT nasal spray Place into both nostrils daily as needed for allergies or rhinitis.    . hydrochlorothiazide (MICROZIDE) 12.5 MG capsule Take 12.5 mg by mouth every other day.    . Omega-3 Fatty Acids (FISH OIL) 1200 MG CAPS Take 1,200 mg by mouth daily.      Marland Kitchen omeprazole (PRILOSEC) 40 MG capsule Take 40 mg by mouth daily.    . polyethylene glycol (MIRALAX / GLYCOLAX) packet Take 17 g by mouth daily as needed for mild constipation.      No current facility-administered medications for this visit.    Allergies  Allergen Reactions  . Lisinopril Itching and Cough  . Other Hives and Itching     Seafood crustaceans  "itchy throat"     Review of Systems: Positive for allergies, vision changes, itching, and skin rash. All other systems reviewed and negative except where noted in HPI.   Physical Exam: BP (!) 148/66   Pulse 89   Ht 5' (1.524 m)   Wt 132 lb (59.9 kg)   BMI 25.78 kg/m  Constitutional:  Well-developed, black female in no acute distress. Psychiatric: Normal mood and affect. Behavior is normal. HEENT: Pupils equal.  Conjunctivae are normal. No scleral icterus. Neck supple.  Cardiovascular: Normal rate, regular rhythm.  Pulmonary/chest: Effort normal and breath sounds normal. No wheezing, rales or rhonchi. Abdominal: Soft, nondistended, nontender. Bowel sounds active throughout. There are no masses palpable. No hepatomegaly. Extremities: no edema Lymphadenopathy: No cervical adenopathy noted. Neurological: Alert and oriented to person place and time. Skin: Skin  is warm and dry. No rashes noted.   ASSESSMENT AND PLAN:  1. 71 yo slowly progressive microcytic anemia. Hgb 10.5 in Feb 2017 >>> 9.4 in mid Nov 2017 >>> down to 7.7 mid Feb 2018 at which time she was hospitalized with symptomatic iron deficiency anemia.  Ferritin 4. No overt GI blood loss, heme negative in hospital. Periodic use of baby asa. -Hgb mid 11 range mid Feb after transfusion. Will repeat CBC today.  -patient needs EGD and colonoscopy. Small bowel biopsies to rule out celiac can be obtained at time of EGD. The risks and benefits of both procedures were discussed and the patient agrees to proceed. Patient former IV drug user and has poor venous access. Will  schedule procedures to be done at the [facility] but this may need to be changed to the hospital pending a discussion with [facility] anesthesiology.   -continue PPI -Advised to avoid NSAIDS for now  2. HCV, couldn't tolerate Harvoni. HCV RNA undetetable. F0 score on  elastrography ultrasound in 2015.   3. HIV, elite controller, not on meds. Followed by Dr. Ninetta Lights.   4. GERD, asymptomatic on PPI    Willette Cluster, NP  04/01/2017, 11:08 AM

## 2017-04-02 NOTE — Progress Notes (Signed)
Reviewed and agree with documentation and assessment and plan. K. Veena Walda Hertzog , MD   

## 2017-04-03 ENCOUNTER — Other Ambulatory Visit: Payer: Self-pay

## 2017-04-04 ENCOUNTER — Encounter: Payer: Self-pay | Admitting: Gastroenterology

## 2017-04-04 ENCOUNTER — Other Ambulatory Visit: Payer: Medicare Other

## 2017-04-04 DIAGNOSIS — B20 Human immunodeficiency virus [HIV] disease: Secondary | ICD-10-CM

## 2017-04-04 DIAGNOSIS — Z113 Encounter for screening for infections with a predominantly sexual mode of transmission: Secondary | ICD-10-CM

## 2017-04-04 DIAGNOSIS — Z79899 Other long term (current) drug therapy: Secondary | ICD-10-CM

## 2017-04-04 LAB — LIPID PANEL
CHOLESTEROL: 223 mg/dL — AB (ref <200)
HDL: 61 mg/dL (ref >50)
LDL Cholesterol: 145 mg/dL — ABNORMAL HIGH (ref <100)
TRIGLYCERIDES: 87 mg/dL (ref <150)
Total CHOL/HDL Ratio: 3.7 Ratio (ref <5.0)
VLDL: 17 mg/dL (ref <30)

## 2017-04-04 LAB — COMPREHENSIVE METABOLIC PANEL
ALK PHOS: 50 U/L (ref 33–130)
ALT: 9 U/L (ref 6–29)
AST: 29 U/L (ref 10–35)
Albumin: 4.1 g/dL (ref 3.6–5.1)
BUN: 17 mg/dL (ref 7–25)
CALCIUM: 9.7 mg/dL (ref 8.6–10.4)
CHLORIDE: 103 mmol/L (ref 98–110)
CO2: 22 mmol/L (ref 20–31)
Creat: 1 mg/dL — ABNORMAL HIGH (ref 0.50–0.99)
Glucose, Bld: 94 mg/dL (ref 65–99)
POTASSIUM: 4.1 mmol/L (ref 3.5–5.3)
Sodium: 136 mmol/L (ref 135–146)
TOTAL PROTEIN: 8.4 g/dL — AB (ref 6.1–8.1)
Total Bilirubin: 0.7 mg/dL (ref 0.2–1.2)

## 2017-04-04 LAB — CBC
HEMATOCRIT: 36.7 % (ref 35.0–45.0)
Hemoglobin: 11.6 g/dL — ABNORMAL LOW (ref 11.7–15.5)
MCH: 24.2 pg — AB (ref 27.0–33.0)
MCHC: 31.6 g/dL — ABNORMAL LOW (ref 32.0–36.0)
MCV: 76.6 fL — AB (ref 80.0–100.0)
Platelets: 136 10*3/uL — ABNORMAL LOW (ref 140–400)
RBC: 4.79 MIL/uL (ref 3.80–5.10)
RDW: 21.8 % — ABNORMAL HIGH (ref 11.0–15.0)
WBC: 3.5 10*3/uL — AB (ref 3.8–10.8)

## 2017-04-05 LAB — RPR

## 2017-04-05 LAB — T-HELPER CELL (CD4) - (RCID CLINIC ONLY)
CD4 T CELL HELPER: 26 % — AB (ref 33–55)
CD4 T Cell Abs: 470 /uL (ref 400–2700)

## 2017-04-08 ENCOUNTER — Other Ambulatory Visit: Payer: Self-pay

## 2017-04-08 ENCOUNTER — Telehealth: Payer: Self-pay

## 2017-04-08 DIAGNOSIS — Z1211 Encounter for screening for malignant neoplasm of colon: Secondary | ICD-10-CM

## 2017-04-08 DIAGNOSIS — D509 Iron deficiency anemia, unspecified: Secondary | ICD-10-CM

## 2017-04-08 LAB — HIV-1 RNA QUANT-NO REFLEX-BLD
HIV 1 RNA Quant: 560 copies/mL — ABNORMAL HIGH
HIV-1 RNA Quant, Log: 2.75 Log copies/mL — ABNORMAL HIGH

## 2017-04-08 MED ORDER — NA SULFATE-K SULFATE-MG SULF 17.5-3.13-1.6 GM/177ML PO SOLN: 1.0000 | Freq: Once | ORAL | 0 refills | Status: AC

## 2017-04-08 NOTE — Telephone Encounter (Signed)
-----   Message from Evalee Jefferson, LPN sent at 03/18/8118 12:36 PM EDT ----- The patient is waiting for a prep prescription to be sent to her pharmacy. She also thinks she is having a procedure on 04/17/17. Have you heard anything about moving it to the hospital?

## 2017-04-08 NOTE — Telephone Encounter (Signed)
I called and spoke with patient regarding her procedures endo/colon scheduled at [facility] for 06/11/17 instead of [facility] (appt canceled on 04/17/17).  Patient is aware. I am sending new prep instructions for patient to follow.

## 2017-04-09 ENCOUNTER — Telehealth: Payer: Self-pay

## 2017-04-09 DIAGNOSIS — D509 Iron deficiency anemia, unspecified: Secondary | ICD-10-CM

## 2017-04-09 DIAGNOSIS — Z1211 Encounter for screening for malignant neoplasm of colon: Secondary | ICD-10-CM

## 2017-04-09 NOTE — Addendum Note (Signed)
Addended by: Zachary George on: 04/09/2017 08:57 AM   Modules accepted: Orders

## 2017-04-09 NOTE — Telephone Encounter (Signed)
-----   Message from Meredith Pel, NP sent at 04/03/2017 12:29 PM EDT ----- Benay Pillow asked Cathlyn Parsons at [facility] to review case..Called patient. Advised Cathlyn Parsons at [facility] to review case, patient hard stick. He thinks she needs to be done at hospital instead. Can you let patient know and reschedule at the hospital ( I told her this may happen). I spoke to end at [facility]. They advised me to put in the notes when we schedule the procedure that patient is a known hard stick and will probably require ultrasound or anesthesia for IV access. Takes

## 2017-04-09 NOTE — Telephone Encounter (Signed)
Spoke with Selena Batten this morning regarding  Notes to be put on the schedule.   She stated she put them in two different places.  Pt advised of date change, place of service change, and new instructions mailed to pt.

## 2017-04-17 ENCOUNTER — Encounter: Payer: Self-pay | Admitting: Gastroenterology

## 2017-04-24 ENCOUNTER — Ambulatory Visit: Payer: Self-pay | Admitting: Infectious Diseases

## 2017-05-02 ENCOUNTER — Telehealth: Payer: Self-pay

## 2017-05-02 DIAGNOSIS — D509 Iron deficiency anemia, unspecified: Secondary | ICD-10-CM

## 2017-05-02 NOTE — Telephone Encounter (Signed)
Pt advised to have CBC drawn.  States she can go May 14, 2017. Order placed for May 14, 2017.

## 2017-05-02 NOTE — Telephone Encounter (Signed)
-----   Message from Meredith PelPaula M Guenther, NP sent at 05/02/2017 10:29 AM EDT ----- Malachi BondsGloria it is okay but please ask her to come in the interval for a CBC. She could come in next week. Thanks  ----- Message ----- From: Evonnie PatMorayati, Gloria Gail, RMA Sent: 04/08/2017   9:09 AM To: Meredith PelPaula M Guenther, NP  Per Zella Ballobin July 3rd is first available date.  Since patients Hgb is now 11.6, can we wait until 7/3? Thanks Malachi BondsGloria, RMA ----- Message ----- From: Meredith PelPaula M Guenther, NP Sent: 04/03/2017  12:29 PM To: Napoleon FormKavitha Nandigam V, MD, #  Benay PillowGloria, I [facility]John [facility]..Called patient. Advised [facility]John [facility] at [facility] to [facility], patient hard stick. He thinks she needs to be done at hospital instead. Can you let patient know and reschedule at the hospital ( I told her this may happen). I spoke to end at [facility]. They advised me to put in the notes when we schedule the procedure that patient is a known hard stick and will probably require ultrasound or anesthesia for IV access. Takes

## 2017-05-08 ENCOUNTER — Encounter: Payer: Self-pay | Admitting: Infectious Diseases

## 2017-05-08 ENCOUNTER — Other Ambulatory Visit (HOSPITAL_COMMUNITY)
Admission: RE | Admit: 2017-05-08 | Discharge: 2017-05-08 | Disposition: A | Discharge status: Home / Self Care | Payer: Medicare Other | Source: Ambulatory Visit | Attending: Infectious Diseases | Admitting: Infectious Diseases

## 2017-05-08 ENCOUNTER — Ambulatory Visit (INDEPENDENT_AMBULATORY_CARE_PROVIDER_SITE_OTHER): Payer: Medicare Other | Admitting: Infectious Diseases

## 2017-05-08 VITALS — BP 148/75 | HR 56 | Temp 98.7°F | Wt 133.0 lb

## 2017-05-08 DIAGNOSIS — N179 Acute kidney failure, unspecified: Secondary | ICD-10-CM

## 2017-05-08 DIAGNOSIS — D649 Anemia, unspecified: Secondary | ICD-10-CM

## 2017-05-08 DIAGNOSIS — B20 Human immunodeficiency virus [HIV] disease: Secondary | ICD-10-CM

## 2017-05-08 DIAGNOSIS — Z113 Encounter for screening for infections with a predominantly sexual mode of transmission: Secondary | ICD-10-CM | POA: Diagnosis present

## 2017-05-08 NOTE — Assessment & Plan Note (Signed)
She is to get endo.  Await results.

## 2017-05-08 NOTE — Addendum Note (Signed)
Addended by: Mariea ClontsGREEN, Efosa Treichler D on: 05/08/2017 04:53 PM   Modules accepted: Orders

## 2017-05-08 NOTE — Progress Notes (Signed)
   Subjective:    Patient ID: Tina Olson, female    DOB: 08/12/1946, 71 y.o.   MRN: [id number]  HPI 71 yo F with HIV+/elite controller (can't do study as she is a hard stick), HTN and Hepatitis C (1b)- not clear if [facility]. She had normal PAP August 2014. Hep A Ab+, Hep B SAb- . Completed second series 2014.  Was started on harvoni in July 2015 for her Hep C (VL 6.5 million) and F0 elastography/ultrasound. She was not able to tolerate this (thinks she took ~ 1/2 of the rx, didn't get last bottle). Her Hep C RNA has been negative (10-04-14) & (06-27-15)  Mammo (-) 10-15-16.  Pap normal 02-03-16. Colon/Endo 06-2017  Had blood transfusion of 2u in Feb 2018. Her most recent hgb is normal but her MCV is low.   Feels fine. No blood in stool, no dark stool. Takes miralax, senna.   HIV 1 RNA Quant (copies/mL)  Date Value  04/04/2017 560 (H)  10/23/2016 585 (H)  01/16/2016 190 (H)   CD4 T Cell Abs (/uL)  Date Value  04/04/2017 470  10/23/2016 660  01/16/2016 630    Review of Systems  Constitutional: Negative for appetite change and unexpected weight change.  Respiratory: Negative for cough and shortness of breath.   Gastrointestinal: Positive for constipation. Negative for abdominal pain, blood in stool, diarrhea, nausea and vomiting.  Genitourinary: Negative for difficulty urinating.  Psychiatric/Behavioral: Negative for dysphoric mood.   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  HENT:  Mouth/Throat: No oropharyngeal exudate.  Eyes: EOM are normal. Pupils are equal, round, and reactive to light.  Neck: Neck supple.  Cardiovascular: Normal rate and regular rhythm.   Pulmonary/Chest: Effort normal and breath sounds normal.  Abdominal: Soft. Bowel sounds are normal. There is no tenderness. There is no rebound.  Musculoskeletal: She exhibits no edema.  Psychiatric: She  has a normal mood and affect.          Assessment & Plan:

## 2017-05-08 NOTE — Assessment & Plan Note (Signed)
Will check BMD Will get her mammo pap after her endo's.  Offered/refused condoms.  Suggested we start her on ART if her CD4 continues to drop, Vl increases.  She agrees.  Will see her back in 6 months.

## 2017-05-08 NOTE — Assessment & Plan Note (Signed)
Last 2 Cr normal, will mark as resolved.

## 2017-05-10 LAB — URINE CYTOLOGY ANCILLARY ONLY
Chlamydia: NEGATIVE
NEISSERIA GONORRHEA: NEGATIVE

## 2017-05-14 ENCOUNTER — Other Ambulatory Visit (INDEPENDENT_AMBULATORY_CARE_PROVIDER_SITE_OTHER): Payer: Medicare Other

## 2017-05-14 DIAGNOSIS — D509 Iron deficiency anemia, unspecified: Secondary | ICD-10-CM | POA: Diagnosis not present

## 2017-05-14 LAB — CBC
HCT: 35.3 % — ABNORMAL LOW (ref 36.0–46.0)
Hemoglobin: 11.4 g/dL — ABNORMAL LOW (ref 12.0–15.0)
MCHC: 32.4 g/dL (ref 30.0–36.0)
MCV: 77.7 fl — ABNORMAL LOW (ref 78.0–100.0)
PLATELETS: 134 10*3/uL — AB (ref 150.0–400.0)
RBC: 4.54 Mil/uL (ref 3.87–5.11)
RDW: 15.9 % — ABNORMAL HIGH (ref 11.5–15.5)
WBC: 3.2 10*3/uL — ABNORMAL LOW (ref 4.0–10.5)

## 2017-06-05 ENCOUNTER — Encounter (HOSPITAL_COMMUNITY): Payer: Self-pay | Admitting: *Deleted

## 2017-06-10 ENCOUNTER — Encounter (HOSPITAL_COMMUNITY): Payer: Self-pay | Admitting: Anesthesiology

## 2017-06-10 NOTE — Anesthesia Preprocedure Evaluation (Addendum)
Anesthesia Evaluation  Patient identified by MRN, date of birth, ID band Patient awake    Reviewed: Allergy & Precautions, NPO status , Patient's Chart, lab work & pertinent test results  Airway Mallampati: II  TM Distance: >3 FB Neck ROM: Full    Dental  (+) Edentulous Upper, Poor Dentition, Missing   Pulmonary former smoker,    Pulmonary exam normal breath sounds clear to auscultation       Cardiovascular hypertension, Normal cardiovascular exam Rhythm:Regular Rate:Normal     Neuro/Psych negative neurological ROS  negative psych ROS   GI/Hepatic GERD  Medicated and Controlled,(+) Hepatitis -, CHematochezia   Endo/Other  Hyperlipidemia  Renal/GU Renal diseaseHx/o AKI  negative genitourinary   Musculoskeletal negative musculoskeletal ROS (+)   Abdominal   Peds  Hematology  (+) anemia , HIV, Chronic leukopenia Thrombocytopenia   Anesthesia Other Findings   Reproductive/Obstetrics                            Anesthesia Physical Anesthesia Plan  ASA: III  Anesthesia Plan: MAC   Post-op Pain Management:    Induction: Intravenous  PONV Risk Score and Plan: 2 and Ondansetron, Propofol, Midazolam and Treatment may vary due to age or medical condition  Airway Management Planned: Natural Airway, Simple Face Mask and Nasal Cannula  Additional Equipment:   Intra-op Plan:   Post-operative Plan:   Informed Consent: I have reviewed the patients History and Physical, chart, labs and discussed the procedure including the risks, benefits and alternatives for the proposed anesthesia with the patient or authorized representative who has indicated his/her understanding and acceptance.     Plan Discussed with: CRNA, Anesthesiologist and Surgeon  Anesthesia Plan Comments:        Anesthesia Quick Evaluation

## 2017-06-11 ENCOUNTER — Ambulatory Visit (HOSPITAL_COMMUNITY)
Admission: RE | Admit: 2017-06-11 | Discharge: 2017-06-11 | Disposition: A | Discharge status: Home / Self Care | Payer: Medicare Other | Source: Ambulatory Visit | Attending: Gastroenterology | Admitting: Gastroenterology

## 2017-06-11 ENCOUNTER — Encounter (HOSPITAL_COMMUNITY)
Admission: RE | Disposition: A | Discharge status: Home / Self Care | Payer: Self-pay | Source: Ambulatory Visit | Attending: Gastroenterology

## 2017-06-11 ENCOUNTER — Ambulatory Visit (HOSPITAL_COMMUNITY): Payer: Medicare Other | Admitting: Anesthesiology

## 2017-06-11 ENCOUNTER — Encounter (HOSPITAL_COMMUNITY): Payer: Self-pay | Admitting: Certified Registered"

## 2017-06-11 DIAGNOSIS — K210 Gastro-esophageal reflux disease with esophagitis: Secondary | ICD-10-CM | POA: Insufficient documentation

## 2017-06-11 DIAGNOSIS — Z87891 Personal history of nicotine dependence: Secondary | ICD-10-CM | POA: Insufficient documentation

## 2017-06-11 DIAGNOSIS — K222 Esophageal obstruction: Secondary | ICD-10-CM | POA: Insufficient documentation

## 2017-06-11 DIAGNOSIS — Z888 Allergy status to other drugs, medicaments and biological substances status: Secondary | ICD-10-CM | POA: Diagnosis not present

## 2017-06-11 DIAGNOSIS — D509 Iron deficiency anemia, unspecified: Secondary | ICD-10-CM | POA: Insufficient documentation

## 2017-06-11 DIAGNOSIS — B1920 Unspecified viral hepatitis C without hepatic coma: Secondary | ICD-10-CM | POA: Insufficient documentation

## 2017-06-11 DIAGNOSIS — I8500 Esophageal varices without bleeding: Secondary | ICD-10-CM | POA: Diagnosis not present

## 2017-06-11 DIAGNOSIS — D508 Other iron deficiency anemias: Secondary | ICD-10-CM | POA: Diagnosis not present

## 2017-06-11 DIAGNOSIS — Z1211 Encounter for screening for malignant neoplasm of colon: Secondary | ICD-10-CM

## 2017-06-11 DIAGNOSIS — K648 Other hemorrhoids: Secondary | ICD-10-CM | POA: Diagnosis not present

## 2017-06-11 DIAGNOSIS — Z79899 Other long term (current) drug therapy: Secondary | ICD-10-CM | POA: Insufficient documentation

## 2017-06-11 DIAGNOSIS — I10 Essential (primary) hypertension: Secondary | ICD-10-CM | POA: Insufficient documentation

## 2017-06-11 DIAGNOSIS — K2970 Gastritis, unspecified, without bleeding: Secondary | ICD-10-CM | POA: Insufficient documentation

## 2017-06-11 DIAGNOSIS — Z21 Asymptomatic human immunodeficiency virus [HIV] infection status: Secondary | ICD-10-CM | POA: Insufficient documentation

## 2017-06-11 HISTORY — PX: COLONOSCOPY WITH PROPOFOL: SHX5780

## 2017-06-11 HISTORY — PX: ESOPHAGOGASTRODUODENOSCOPY (EGD) WITH PROPOFOL: SHX5813

## 2017-06-11 SURGERY — COLONOSCOPY WITH PROPOFOL
Anesthesia: Monitor Anesthesia Care

## 2017-06-11 MED ORDER — OMEPRAZOLE 40 MG PO CPDR: 40.0000 mg | DELAYED_RELEASE_CAPSULE | Freq: Two times a day (BID) | ORAL | 11 refills | Status: DC

## 2017-06-11 MED ORDER — PROPOFOL 500 MG/50ML IV EMUL
INTRAVENOUS | Status: DC | PRN
  Administered 2017-06-11: 100 ug/kg/min via INTRAVENOUS

## 2017-06-11 MED ORDER — LACTATED RINGERS IV SOLN
INTRAVENOUS | Status: DC
  Administered 2017-06-11: 1000 mL via INTRAVENOUS

## 2017-06-11 MED ORDER — LIDOCAINE 2% (20 MG/ML) 5 ML SYRINGE
INTRAMUSCULAR | Status: DC | PRN
  Administered 2017-06-11: 50 mg via INTRAVENOUS

## 2017-06-11 MED ORDER — PROPOFOL 10 MG/ML IV BOLUS
INTRAVENOUS | Status: DC | PRN
  Administered 2017-06-11 (×2): 50 mg via INTRAVENOUS

## 2017-06-11 MED ORDER — SODIUM CHLORIDE 0.9 % IV SOLN: INTRAVENOUS | Status: DC

## 2017-06-11 MED ORDER — LIDOCAINE 2% (20 MG/ML) 5 ML SYRINGE
INTRAMUSCULAR | Status: AC
  Filled 2017-06-11: qty 5

## 2017-06-11 MED ORDER — PROPOFOL 10 MG/ML IV BOLUS
INTRAVENOUS | Status: AC
  Filled 2017-06-11: qty 40

## 2017-06-11 SURGICAL SUPPLY — 24 items

## 2017-06-11 NOTE — Transfer of Care (Signed)
Immediate Anesthesia Transfer of Care Note  Patient: Tina Olson  Procedure(s) Performed: Procedure(s) with comments: COLONOSCOPY WITH PROPOFOL (N/A) - PT IS KNOWN HEART STICK. WILL PROBABLY REQUIRE ULTRASOUND OR ANESTHESIA FOR IV STICK. ESOPHAGOGASTRODUODENOSCOPY (EGD) WITH PROPOFOL (N/A)  Patient Location: PACU  Anesthesia Type:MAC  Level of Consciousness: awake, alert  and oriented  Airway & Oxygen Therapy: Patient Spontanous Breathing and Patient connected to nasal cannula oxygen  Post-op Assessment: Report given to RN and Post -op Vital signs reviewed and stable  Post vital signs: Reviewed and stable  Last Vitals:  Vitals:   06/11/17 0728  BP: (!) 152/71  Pulse: (!) 49  Resp: (!) 9  Temp: 36.7 C    Last Pain:  Vitals:   06/11/17 0728  TempSrc: Oral         Complications: No apparent anesthesia complications

## 2017-06-11 NOTE — Op Note (Signed)
[facility] Patient Name: Tina NewportMarie Dowell Procedure Date: 06/11/2017 MRN: [id number] Attending MD: Napoleon FormKavitha V. Brya Simerly , MD Date of Birth: 05/09/1946 CSN: [id number] Age: [age] Admit Type: Outpatient Procedure:                Colonoscopy Indications:              Unexplained iron deficiency anemia Providers:                Napoleon FormKavitha V. Kishaun Erekson, MD, Norman ClayLisa Nunn, RN, Oletha Blendavida                            Shoffner, Technician Referring MD:              Medicines:                None, unsedated, no IV access Complications:            No immediate complications. Estimated Blood Loss:     Estimated blood loss: none. Procedure:                Pre-Anesthesia Assessment:                           - Prior to the procedure, a History and Physical                            was performed, and patient medications and                            allergies were reviewed. The patient's tolerance of                            previous anesthesia was also reviewed. The risks                            and benefits of the procedure and the sedation                            options and risks were discussed with the patient.                            All questions were answered, and informed consent                            was obtained. Prior Anticoagulants: The patient has                            taken no previous anticoagulant or antiplatelet                            agents. ASA Grade Assessment: III - A patient with                            severe systemic disease. After reviewing the risks  and benefits, the patient was deemed in                            satisfactory condition to undergo the procedure.                           - Prior to the procedure, a History and Physical                            was performed, and patient medications and                            allergies were reviewed. The patient's tolerance of                            previous  anesthesia was also reviewed. The risks                            and benefits of the procedure and the sedation                            options and risks were discussed with the patient.                            All questions were answered, and informed consent                            was obtained. Prior Anticoagulants: The patient has                            taken no previous anticoagulant or antiplatelet                            agents. ASA Grade Assessment: III - A patient with                            severe systemic disease. After reviewing the risks                            and benefits, the patient was deemed in                            satisfactory condition to undergo the procedure.                           After obtaining informed consent, the colonoscope                            was passed under direct vision. Throughout the                            procedure, the patient's blood pressure, pulse, and  oxygen saturations were monitored continuously. The                            was introduced through the anus and advanced to the                            the terminal ileum, with identification of the                            appendiceal orifice and IC valve. The colonoscopy                            was performed without difficulty. The patient                            tolerated the procedure well. The quality of the                            bowel preparation was excellent. The terminal                            ileum, ileocecal valve, appendiceal orifice, and                            rectum were photographed. Scope In: 8:53:59 AM Scope Out: 9:07:27 AM Scope Withdrawal Time: 0 hours 6 minutes 31 seconds  Total Procedure Duration: 0 hours 13 minutes 28 seconds  Findings:      The perianal and digital rectal examinations were normal.      Non-bleeding internal hemorrhoids were found during retroflexion. The        hemorrhoids were medium-sized.      The exam was otherwise without abnormality. Impression:               - Non-bleeding internal hemorrhoids.                           - The examination was otherwise normal.                           - No specimens collected. Moderate Sedation:      N/A- Per Anesthesia Care Recommendation:           - Patient has a contact number available for                            emergencies. The signs and symptoms of potential                            delayed complications were discussed with the                            patient. Return to normal activities tomorrow.                            Written discharge instructions were provided to the  patient.                           - Resume previous diet.                           - Continue present medications.                           - Repeat colonoscopy in 10 years for screening                            purposes.                           - Return to GI clinic PRN. Procedure Code(s):        --- Professional ---                           [phone], Colonoscopy, flexible; diagnostic, including                            collection of specimen(s) by brushing or washing,                            when performed (separate procedure) Diagnosis Code(s):        --- Professional ---                           K64.8, Other hemorrhoids                           D50.9, Iron deficiency anemia, unspecified CPT copyright 2016 [employer]. All rights reserved. The codes documented in this report are preliminary and upon coder review may  be revised to meet current compliance requirements. Napoleon Form, MD 06/11/2017 9:27:29 AM This report has been signed electronically. Number of Addenda: 0

## 2017-06-11 NOTE — Op Note (Signed)
[facility] Patient Name: Tina Olson Procedure Date: 06/11/2017 MRN: [id number] Attending MD: Napoleon Form , MD Date of Birth: 05-05-1946 CSN: [id number] Age: 71 Admit Type: Outpatient Procedure:                Upper GI endoscopy Indications:              Suspected upper gastrointestinal bleeding in                            patient with unexplained iron deficiency anemia,                            Gastro-esophageal reflux disease Providers:                Napoleon Form, MD, Norman Clay, RN, Oletha Blend, Technician Referring MD:              Medicines:                Monitored Anesthesia Care Complications:            No immediate complications. Estimated Blood Loss:     Estimated blood loss was minimal. Procedure:                Pre-Anesthesia Assessment:                           - Prior to the procedure, a History and Physical                            was performed, and patient medications and                            allergies were reviewed. The patient's tolerance of                            previous anesthesia was also reviewed. The risks                            and benefits of the procedure and the sedation                            options and risks were discussed with the patient.                            All questions were answered, and informed consent                            was obtained. Prior Anticoagulants: The patient has                            taken no previous anticoagulant or antiplatelet                            agents. ASA  Grade Assessment: III - A patient with                            severe systemic disease. After reviewing the risks                            and benefits, the patient was deemed in                            satisfactory condition to undergo the procedure.                           After obtaining informed consent, the endoscope was                             passed under direct vision. Throughout the                            procedure, the patient's blood pressure, pulse, and                            oxygen saturations were monitored continuously. The                            EG-[id number] ([id number]) scope was introduced through the                            mouth, and advanced to the second part of duodenum.                            The upper GI endoscopy was technically difficult                            and complex due to ineffective sedation. The                            patient tolerated the procedure fairly well. Scope In: Scope Out: Findings:      LA Grade B (one or more mucosal breaks greater than 5 mm, not extending       between the tops of two mucosal folds) esophagitis with bleeding was       found 34 to 36 cm from the incisors. Small mucosal tears at EG junction       due to retching and couging by patient due to ineffective sedation      One mild (non-circumferential scarring) benign-appearing, intrinsic       stenosis was found 36 cm from the incisors. This measured 2 cm (inner       diameter) x less than one cm (in length) and was traversed.      One column of grade I, small (< 5 mm) varices were found in the lower       third of the esophagus, 35 cm from the incisors. They were less than 1       mm in largest diameter. No stigmata of recent bleeding were evident and  no red wale signs were present.      Scattered mild inflammation characterized by congestion (edema),       erythema and friability was found in the entire examined stomach.      The examined duodenum was normal. Impression:               - LA Grade B reflux esophagitis.                           - Benign-appearing esophageal stenosis.                           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                           - Gastritis.                           - Normal examined duodenum.                            - No specimens collected. Moderate Sedation:      N/A- Per Anesthesia Care Recommendation:           - Patient has a contact number available for                            emergencies. The signs and symptoms of potential                            delayed complications were discussed with the                            patient. Return to normal activities tomorrow.                            Written discharge instructions were provided to the                            patient.                           - Resume previous diet.                           - Continue present medications.                           - Increase Omeprazole to 40mg  twice daily before                            breakfast and dinner                           - No aspirin, ibuprofen, naproxen, or other  non-steroidal anti-inflammatory drugs.                           - Repeat upper endoscopy in 2 years for screening                            of esophageal varices. Patient will need PICC line                            for IV access and will need to be scheduled at                            hospital endoscopy unit                           - Follow up in liver clinic                           - Abdominal ultrasound with dopplers                           - Return to GI clinic PRN. Procedure Code(s):        --- Professional ---                           [phone], Esophagogastroduodenoscopy, flexible,                            transoral; diagnostic, including collection of                            specimen(s) by brushing or washing, when performed                            (separate procedure) Diagnosis Code(s):        --- Professional ---                           K21.0, Gastro-esophageal reflux disease with                            esophagitis                           K22.2, Esophageal obstruction                           I85.00, Esophageal varices without  bleeding                           K29.70, Gastritis, unspecified, without bleeding                           D50.9, Iron deficiency anemia, unspecified CPT copyright 2016 American Medical Association. All rights reserved. The codes documented in this report are preliminary and upon coder review may  be revised to meet current compliance requirements. Napoleon Form, MD 06/11/2017  9:24:46 AM This report has been signed electronically. Number of Addenda: 0

## 2017-06-11 NOTE — H&P (Signed)
[facility] History and Physical   Primary Care Physician:  Zachery Dauerdem, Donna S, FNP   Reason for Procedure:  Iron deficiency anemia Plan:    EGD and colonoscopy with possible intervention The risks and benefits as well as alternatives of endoscopic procedure(s) have been discussed and reviewed. All questions answered. The patient agrees to proceed.     HPI: Tina Olson is a 71 y.o. female with hepatitis C, HIV, GERD with worsening iron deficiency anemia here for evaluation. Patient denies any overt GI bleed. Past history of drug abuse with difficult IV placement. Denies any nausea, vomiting, abdominal pain, melena or bright red blood per rectum    Past Medical History:  Diagnosis Date  . Acid reflux   . Anemia   . Hepatitis C   . HIV infection (HCC)   . Hypertension     Past Surgical History:  Procedure Laterality Date  . CATARACT EXTRACTION Right 2016    Prior to Admission medications   Medication Sig Start Date End Date Taking? Authorizing Provider  acetaminophen (TYLENOL) 325 MG tablet Take 325 mg by mouth daily as needed for moderate pain or headache.   Yes [provider]  BIOTIN PO Take 500 mcg by mouth daily.   Yes [provider]  cetirizine (ZYRTEC) 10 MG tablet Take 10 mg by mouth daily as needed for allergies.    Yes [provider]  docusate sodium (COLACE) 100 MG capsule Take 100 mg by mouth daily as needed for mild constipation.   Yes [provider]  fluticasone (FLONASE) 50 MCG/ACT nasal spray Place 1 spray into both nostrils daily as needed for allergies or rhinitis.    Yes [provider]  guaiFENesin (ROBITUSSIN MUCUS+CHEST CONGEST) 100 MG/5ML liquid Take 100 mg by mouth daily as needed for cough.   Yes [provider]  hydrochlorothiazide (MICROZIDE) 12.5 MG capsule Take 12.5 mg by mouth daily.    Yes [provider]  ibuprofen (ADVIL,MOTRIN) 200 MG tablet Take 200 mg by mouth daily  as needed for moderate pain.   Yes [provider]  Naphazoline HCl (CLEAR EYES OP) Apply 1 drop to eye daily as needed (dry eyes).   Yes [provider]  Omega-3 Fatty Acids (FISH OIL) 1200 MG CAPS Take 1,200 mg by mouth daily.   Yes [provider]  omeprazole (PRILOSEC) 40 MG capsule Take 40 mg by mouth daily.   Yes [provider]  polyethylene glycol (MIRALAX / GLYCOLAX) packet Take 17 g by mouth daily as needed for mild constipation.    Yes [provider]  senna (SENOKOT) 8.6 MG TABS tablet Take 1 tablet by mouth daily as needed for mild constipation.   Yes [provider]    Current Facility-Administered Medications  Medication Dose Route Frequency Provider Last Rate Last Dose  . 0.9 %  sodium chloride infusion   Intravenous Continuous Meredith PelGuenther, Paula M, NP        Allergies as of 04/08/2017 - Review Complete 04/02/2017  Allergen Reaction Noted  . Lisinopril Itching and Cough 02/25/2012  . Other Hives and Itching 07/07/2012    Family History  Problem Relation Age of Onset  . Alzheimer's disease Mother   . Glaucoma Mother   . Hypertension Mother   . Cancer Father        ? type  . Seizures Son     Social History   Social History  . Marital status: Widowed    Spouse name: N/A  .  Number of children: N/A  . Years of education: N/A   Occupational History  . Not on file.   Social History Main Topics  . Smoking status: Former Smoker    Types: Cigarettes    Quit date: 12/10/2000  . Smokeless tobacco: Never Used  . Alcohol use No  . Drug use: No     Comment: clean since around 2000  . Sexual activity: No     Comment: pt. declined condoms   Other Topics Concern  . Not on file   Social History Narrative  . No narrative on file    Review of Systems:  All other review of systems negative except as mentioned in the HPI.  Physical Exam: Vital signs in last 24 hours: Temp:  [98.1 F (36.7 C)] 98.1 F (36.7 C)  (07/03 0728) Pulse Rate:  [49] 49 (07/03 0728) Resp:  [9] 9 (07/03 0728) BP: (152)/(71) 152/71 (07/03 0728) SpO2:  [97 %] 97 % (07/03 0728) Weight:  [130 lb (59 kg)] 130 lb (59 kg) (07/03 0728)   General:   Alert,  Well-developed, well-nourished, pleasant and cooperative in NAD Lungs:  Clear throughout to auscultation.   Heart:  Regular rate and rhythm; no murmurs, clicks, rubs,  or gallops. Abdomen:  Soft, nontender and nondistended. Normal bowel sounds.   Neuro/Psych:  Alert and cooperative. Normal mood and affect. A and O x 3   @K .Scherry Ran, MD [phone] Mon-Fri 8a-5p [phone] after 5p, weekends, holidays 06/11/2017 8:20 AM@

## 2017-06-11 NOTE — Anesthesia Postprocedure Evaluation (Signed)
Anesthesia Post Note  Patient: KARMAH POTOCKI  Procedure(s) Performed: Procedure(s) (LRB): COLONOSCOPY WITH PROPOFOL (N/A) ESOPHAGOGASTRODUODENOSCOPY (EGD) WITH PROPOFOL (N/A)     Patient location during evaluation: PACU Anesthesia Type: MAC Level of consciousness: awake and alert and oriented Pain management: pain level controlled Vital Signs Assessment: post-procedure vital signs reviewed and stable Respiratory status: spontaneous breathing, nonlabored ventilation and respiratory function stable Cardiovascular status: stable and blood pressure returned to baseline Postop Assessment: no signs of nausea or vomiting Anesthetic complications: no    Last Vitals:  Vitals:   06/11/17 0920 06/11/17 0930  BP: 134/75 (!) 153/69  Pulse: (!) 50 (!) 54  Resp: 10 14  Temp:      Last Pain:  Vitals:   06/11/17 0915  TempSrc: Oral                 Slater Mcmanaman A.

## 2017-06-13 ENCOUNTER — Encounter (HOSPITAL_COMMUNITY): Payer: Self-pay | Admitting: Gastroenterology

## 2017-06-13 ENCOUNTER — Telehealth: Payer: Self-pay

## 2017-06-13 ENCOUNTER — Other Ambulatory Visit: Payer: Self-pay

## 2017-06-13 DIAGNOSIS — K922 Gastrointestinal hemorrhage, unspecified: Secondary | ICD-10-CM

## 2017-06-13 NOTE — Telephone Encounter (Signed)
Spoke with the patient. She agrees to the appointment.

## 2017-06-13 NOTE — Telephone Encounter (Signed)
Left a message with female answering phone. He agrees to ask her to call when she comes in.  Abdominal u/s with dopplers scheduled [facility] 06/27/17 [facility].  She will need to arrive at 9:15 am. NPO after midnight.

## 2017-06-20 ENCOUNTER — Ambulatory Visit (HOSPITAL_COMMUNITY): Admission: RE | Admit: 2017-06-20 | Payer: Medicare Other | Source: Ambulatory Visit

## 2017-06-27 ENCOUNTER — Ambulatory Visit (HOSPITAL_COMMUNITY)
Admission: RE | Admit: 2017-06-27 | Discharge: 2017-06-27 | Disposition: A | Discharge status: Home / Self Care | Payer: Medicare Other | Source: Ambulatory Visit | Attending: Gastroenterology | Admitting: Gastroenterology

## 2017-06-27 DIAGNOSIS — K922 Gastrointestinal hemorrhage, unspecified: Secondary | ICD-10-CM | POA: Diagnosis not present

## 2017-07-22 ENCOUNTER — Ambulatory Visit (INDEPENDENT_AMBULATORY_CARE_PROVIDER_SITE_OTHER): Payer: Medicare Other | Admitting: Infectious Diseases

## 2017-07-22 ENCOUNTER — Other Ambulatory Visit (HOSPITAL_COMMUNITY)
Admission: RE | Admit: 2017-07-22 | Discharge: 2017-07-22 | Disposition: A | Discharge status: Home / Self Care | Payer: Medicare Other | Source: Ambulatory Visit | Attending: Infectious Diseases | Admitting: Infectious Diseases

## 2017-07-22 ENCOUNTER — Encounter: Payer: Self-pay | Admitting: Infectious Diseases

## 2017-07-22 VITALS — BP 128/76 | HR 62 | Temp 98.3°F | Wt 133.0 lb

## 2017-07-22 DIAGNOSIS — Z124 Encounter for screening for malignant neoplasm of cervix: Secondary | ICD-10-CM

## 2017-07-22 NOTE — Progress Notes (Signed)
     Subjective:    Tina Olson is a 71 y.o. patient here for her annual pap smear and pelvic exam.  No LMP recorded. Patient is postmenopausal.     Current GYN complaints or concerns: none; reports some previous thinning of her vaginal canal.   Past Medical History:  Diagnosis Date  . Acid reflux   . Anemia   . Hepatitis C   . HIV infection (HCC)   . Hypertension     Gynecologic History No obstetric history on file.  No LMP recorded. Patient is postmenopausal. Contraception: condoms Last Pap: 02/03/2016 Results were: normal Last Mammogram: due in September  Review of Systems Patient denies any abdominal/pelvic pain, problems with bowel movements, urination, vaginal discharge or intercourse.  Objective:  BP 128/76   Pulse 62   Temp 98.3 F (36.8 C) (Oral)   Wt 133 lb (60.3 kg)   BMI 25.97 kg/m  Physical Exam  Constitutional: Well developed, well nourished, no acute distress. She is alert and oriented x3.  Pelvic: External genitalia is normal in appearance. The vaginal walls do show some atrophy. The cervix is bulbus and easily visualized; bleeds easily. No CMT, normal expected cervical mucus present. Bimanual exam deferred by patient today.  Psych: She has a normal mood and affect.    Assessment:  Thin prep pap was obtained and sent for cytology w/ reflex HPV and GC/C today.   Plan:  Health Maintenance = Return in 1 year for annual pap screening unless indicated sooner.   HIV = F/U as scheduled with Dr. [name] for ongoing HIV care.   Rexene AlbertsStephanie Dixon, MSN, NP-C [facility]  07/22/2017 3:06 PM

## 2017-07-24 ENCOUNTER — Encounter: Payer: Self-pay | Admitting: Infectious Diseases

## 2017-07-24 LAB — CYTOLOGY - PAP
Chlamydia: NEGATIVE
Diagnosis: NEGATIVE
Neisseria Gonorrhea: NEGATIVE

## 2017-09-06 ENCOUNTER — Other Ambulatory Visit: Payer: Self-pay | Admitting: Infectious Diseases

## 2017-09-06 DIAGNOSIS — Z1231 Encounter for screening mammogram for malignant neoplasm of breast: Secondary | ICD-10-CM

## 2017-10-08 ENCOUNTER — Emergency Department (HOSPITAL_COMMUNITY)
Admission: EM | Admit: 2017-10-08 | Discharge: 2017-10-08 | Disposition: A | Discharge status: Home / Self Care | Payer: Medicare Other | Attending: Physician Assistant | Admitting: Physician Assistant

## 2017-10-08 ENCOUNTER — Encounter (HOSPITAL_COMMUNITY): Payer: Self-pay | Admitting: Emergency Medicine

## 2017-10-08 ENCOUNTER — Emergency Department (HOSPITAL_COMMUNITY): Payer: Medicare Other

## 2017-10-08 DIAGNOSIS — Z79899 Other long term (current) drug therapy: Secondary | ICD-10-CM | POA: Insufficient documentation

## 2017-10-08 DIAGNOSIS — Z87891 Personal history of nicotine dependence: Secondary | ICD-10-CM | POA: Insufficient documentation

## 2017-10-08 DIAGNOSIS — I10 Essential (primary) hypertension: Secondary | ICD-10-CM | POA: Diagnosis not present

## 2017-10-08 DIAGNOSIS — R42 Dizziness and giddiness: Secondary | ICD-10-CM

## 2017-10-08 LAB — URINALYSIS, ROUTINE W REFLEX MICROSCOPIC
BILIRUBIN URINE: NEGATIVE
Glucose, UA: NEGATIVE mg/dL
KETONES UR: NEGATIVE mg/dL
LEUKOCYTES UA: NEGATIVE
NITRITE: NEGATIVE
PH: 6 (ref 5.0–8.0)
Protein, ur: NEGATIVE mg/dL
Specific Gravity, Urine: 1.01 (ref 1.005–1.030)

## 2017-10-08 LAB — HEPATIC FUNCTION PANEL
ALBUMIN: 3.8 g/dL (ref 3.5–5.0)
ALT: 11 U/L — ABNORMAL LOW (ref 14–54)
AST: 26 U/L (ref 15–41)
Alkaline Phosphatase: 55 U/L (ref 38–126)
BILIRUBIN INDIRECT: 0.7 mg/dL (ref 0.3–0.9)
Bilirubin, Direct: 0.1 mg/dL (ref 0.1–0.5)
TOTAL PROTEIN: 9.2 g/dL — AB (ref 6.5–8.1)
Total Bilirubin: 0.8 mg/dL (ref 0.3–1.2)

## 2017-10-08 LAB — BASIC METABOLIC PANEL
ANION GAP: 10 (ref 5–15)
BUN: 17 mg/dL (ref 6–20)
CHLORIDE: 103 mmol/L (ref 101–111)
CO2: 22 mmol/L (ref 22–32)
Calcium: 9.8 mg/dL (ref 8.9–10.3)
Creatinine, Ser: 1.1 mg/dL — ABNORMAL HIGH (ref 0.44–1.00)
GFR calc Af Amer: 58 mL/min — ABNORMAL LOW (ref >60)
GFR, EST NON AFRICAN AMERICAN: 50 mL/min — AB (ref >60)
GLUCOSE: 106 mg/dL — AB (ref 65–99)
POTASSIUM: 3.7 mmol/L (ref 3.5–5.1)
Sodium: 135 mmol/L (ref 135–145)

## 2017-10-08 LAB — I-STAT TROPONIN, ED: TROPONIN I, POC: 0.03 ng/mL (ref 0.00–0.08)

## 2017-10-08 LAB — CBC
HEMATOCRIT: 36.2 % (ref 36.0–46.0)
HEMOGLOBIN: 11.9 g/dL — AB (ref 12.0–15.0)
MCH: 25.8 pg — ABNORMAL LOW (ref 26.0–34.0)
MCHC: 32.9 g/dL (ref 30.0–36.0)
MCV: 78.5 fL (ref 78.0–100.0)
Platelets: 142 10*3/uL — ABNORMAL LOW (ref 150–400)
RBC: 4.61 MIL/uL (ref 3.87–5.11)
RDW: 15.8 % — AB (ref 11.5–15.5)
WBC: 2.7 10*3/uL — AB (ref 4.0–10.5)

## 2017-10-08 LAB — ETHANOL

## 2017-10-08 LAB — CBG MONITORING, ED: GLUCOSE-CAPILLARY: 108 mg/dL — AB (ref 65–99)

## 2017-10-08 MED ORDER — SODIUM CHLORIDE 0.9 % IV BOLUS (SEPSIS): 1000.0000 mL | Freq: Once | INTRAVENOUS | Status: DC

## 2017-10-08 NOTE — ED Notes (Signed)
Pt states unable to void at this time. Pt made aware of need for urine sample.

## 2017-10-08 NOTE — ED Triage Notes (Signed)
States  Started to feel dizzy and syncopal today , went to  Have Bm felt better but then got worse agagin, states comes in waves , felt like abd was hard

## 2017-10-08 NOTE — ED Provider Notes (Signed)
[facility] Provider Note   CSN: [id number] Arrival date & time: 10/08/17  1315     History   Chief Complaint Chief Complaint  Patient presents with  . Dizziness    HPI Elpidio EricMarie G Asare is a 71 y.o. female.  HPI   Elpidio EricMarie G Goodlin is a 71 y.o. female, with a history of anemia, hepatitis C, HIV, and HTN, presenting to the ED with an episode of lightheadedness around 12:30pm today. States she was sitting and eating lunch at the time. Felt bloating in her abdomen, had a BM, and felt better. Has not had a recurrence of her symptoms since that time.   States she has not had to take antiretroviral medications because her "levels have always been good." Followed by Johny SaxJeffrey Hatcher for her HIV. Has follow up appt with Dr. [name] in Nov, but can not remember the date.  Denies chest pain, shortness of breath, N/V/D, hematochezia/melena, abdominal pain, LOC, falls/trauma, urinary symptoms, diaphoresis, or any other complaints.   Last CD4 count and HIV quant was on 04/04/17: 470 and 560, respectively.    Past Medical History:  Diagnosis Date  . Acid reflux   . Anemia   . Hepatitis C   . HIV infection (HCC)   . Hypertension     Patient Active Problem List   Diagnosis Date Noted  . Chest pain 01/26/2017  . Microcytic anemia 01/26/2017  . Acute kidney injury (HCC) 01/26/2017  . 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 Constipation 01/26/2017  . Hypertension 10/08/2011  . ABDOMINAL PAIN, UNSPECIFIED SITE 09/22/2010  . UTI 06/05/2010  . SHELLFISH ALLERGY 10/20/2009  . 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 BREAST MASS, BENIGN 04/18/2009  . HEMATOCHEZIA, HX OF 04/18/2009    Past Surgical History:  Procedure Laterality Date  . CATARACT EXTRACTION Right 2016  . COLONOSCOPY WITH PROPOFOL N/A 06/11/2017   Procedure: COLONOSCOPY WITH PROPOFOL;  Surgeon: Napoleon FormNandigam, Kavitha V, MD;  Location: [facility] ENDOSCOPY;  Service:  Endoscopy;  Laterality: N/A;  PT IS KNOWN HEART STICK. WILL PROBABLY REQUIRE ULTRASOUND OR ANESTHESIA FOR IV STICK.  Marland Kitchen. ESOPHAGOGASTRODUODENOSCOPY (EGD) WITH PROPOFOL N/A 06/11/2017   Procedure: ESOPHAGOGASTRODUODENOSCOPY (EGD) WITH PROPOFOL;  Surgeon: Napoleon FormNandigam, Kavitha V, MD;  Location: [facility] ENDOSCOPY;  Service: Endoscopy;  Laterality: N/A;    OB History    No data available       Home Medications    Prior to Admission medications   Medication Sig Start Date End Date Taking? Authorizing Provider  acetaminophen (TYLENOL) 325 MG tablet Take 325 mg by mouth daily as needed for moderate pain or headache.   Yes [provider]  BIOTIN PO Take 500 mcg by mouth daily.   Yes [provider]  calcium carbonate (OSCAL) 1500 (600 Ca) MG TABS tablet Take 600 mg of elemental calcium by mouth daily with breakfast.   Yes [provider]  docusate sodium (COLACE) 100 MG capsule Take 100 mg by mouth daily as needed for mild constipation.   Yes [provider]  fluticasone (FLONASE) 50 MCG/ACT nasal spray Place 1 spray into both nostrils daily as needed for allergies or rhinitis.    Yes [provider]  guaiFENesin (ROBITUSSIN MUCUS+CHEST CONGEST) 100 MG/5ML liquid Take 100 mg by mouth daily as needed for cough.   Yes [provider]  hydrochlorothiazide (MICROZIDE) 12.5 MG capsule Take 12.5 mg by mouth daily.    Yes [provider]  Naphazoline HCl (CLEAR EYES OP) Apply  1 drop to eye daily as needed (dry eyes).   Yes [provider]  Omega-3 Fatty Acids (FISH OIL) 1200 MG CAPS Take 1,200 mg by mouth daily.   Yes [provider]  omeprazole (PRILOSEC) 40 MG capsule Take 1 capsule (40 mg total) by mouth 2 (two) times daily at 8 am and 10 pm. 06/11/17  Yes Nandigam, Eleonore Chiquito, MD  polyethylene glycol (MIRALAX / GLYCOLAX) packet Take 17 g by mouth daily as needed for mild constipation.    Yes [provider]  senna (SENOKOT) 8.6  MG TABS tablet Take 1 tablet by mouth daily as needed for mild constipation.   Yes [provider]    Family History Family History  Problem Relation Age of Onset  . Alzheimer's disease Mother   . Glaucoma Mother   . Hypertension Mother   . Cancer Father        ? type  . Seizures Son     Social History Social History  Substance Use Topics  . Smoking status: Former Smoker    Types: Cigarettes    Quit date: 12/10/2000  . Smokeless tobacco: Never Used  . Alcohol use No     Allergies   Lisinopril and Other   Review of Systems Review of Systems  Constitutional: Negative for chills, diaphoresis and fever.  Respiratory: Negative for shortness of breath.   Cardiovascular: Negative for chest pain.  Gastrointestinal: Negative for abdominal pain, diarrhea, nausea and vomiting.  Genitourinary: Negative for dysuria, flank pain, frequency and hematuria.  Neurological: Positive for light-headedness. Negative for syncope, weakness and numbness.  All other systems reviewed and are negative.    Physical Exam Updated Vital Signs BP (!) 146/75   Pulse (!) 52   Temp 98.9 F (37.2 C) (Oral)   Resp 16   Ht 5' (1.524 m)   Wt 59 kg (130 lb)   SpO2 100%   BMI 25.39 kg/m   Physical Exam  Constitutional: She is oriented to person, place, and time. She appears well-developed and well-nourished. No distress.  HENT:  Head: Normocephalic and atraumatic.  Mouth/Throat: Oropharynx is clear and moist.  Eyes: Pupils are equal, round, and reactive to light. Conjunctivae and EOM are normal.  Neck: Neck supple.  Cardiovascular: Normal rate, regular rhythm, normal heart sounds and intact distal pulses.   Pulmonary/Chest: Effort normal and breath sounds normal. No respiratory distress.  Abdominal: Soft. She exhibits no distension. There is no tenderness. There is no guarding.  Musculoskeletal: She exhibits no edema.  Lymphadenopathy:    She has no cervical adenopathy.  Neurological:  She is alert and oriented to person, place, and time.  No sensory deficits.  No noted speech deficits. No aphasia. Patient handles oral secretions without difficulty. No noted swallowing defects.  Equal grip strength bilaterally. Strength 5/5 in the upper extremities. Strength 5/5 with flexion and extension of the hips, knees, and ankles bilaterally.  Patellar DTRs 2+ bilaterally Negative Romberg. No gait disturbance.  Coordination intact including heel to shin and finger to nose.  Cranial nerves III-XII grossly intact.  No facial droop.   Skin: Skin is warm and dry. Capillary refill takes less than 2 seconds. She is not diaphoretic.  Psychiatric: She has a normal mood and affect. Her behavior is normal.  Nursing note and vitals reviewed.    ED Treatments / Results  Labs (all labs ordered are listed, but only abnormal results are displayed) Labs Reviewed  BASIC METABOLIC PANEL - Abnormal; Notable for the  following:       Result Value   Glucose, Bld 106 (*)    Creatinine, Ser 1.10 (*)    GFR calc non Af Amer 50 (*)    GFR calc Af Amer 58 (*)    All other components within normal limits  CBC - Abnormal; Notable for the following:    WBC 2.7 (*)    Hemoglobin 11.9 (*)    MCH 25.8 (*)    RDW 15.8 (*)    Platelets 142 (*)    All other components within normal limits  URINALYSIS, ROUTINE W REFLEX MICROSCOPIC - Abnormal; Notable for the following:    Hgb urine dipstick SMALL (*)    Bacteria, UA MANY (*)    Squamous Epithelial / LPF 0-5 (*)    All other components within normal limits  HEPATIC FUNCTION PANEL - Abnormal; Notable for the following:    Total Protein 9.2 (*)    ALT 11 (*)    All other components within normal limits  CBG MONITORING, ED - Abnormal; Notable for the following:    Glucose-Capillary 108 (*)    All other components within normal limits  ETHANOL  I-STAT TROPONIN, ED   BUN  Date Value Ref Range Status  10/08/2017 17 6 - 20 mg/dL Final  16/09/9603 17  7 - 25 mg/dL Final  54/08/8118 21 (H) 6 - 20 mg/dL Final  14/78/2956 24 (H) 6 - 20 mg/dL Final   Creat  Date Value Ref Range Status  04/04/2017 1.00 (H) 0.50 - 0.99 mg/dL Final    Comment:      For patients > or = 69 years of age: The upper reference limit for Creatinine is approximately 13% higher for people identified as African-American.     10/23/2016 1.11 (H) 0.50 - 0.99 mg/dL Final    Comment:      For patients > or = 69 years of age: The upper reference limit for Creatinine is approximately 13% higher for people identified as African-American.     01/18/2016 1.16 (H) 0.50 - 0.99 mg/dL Final  21/30/8657 8.46 (H) 0.50 - 0.99 mg/dL Final   Creatinine, Ser  Date Value Ref Range Status  10/08/2017 1.10 (H) 0.44 - 1.00 mg/dL Final  96/29/5284 1.32 0.44 - 1.00 mg/dL Final  44/12/270 5.36 (H) 0.44 - 1.00 mg/dL Final   WBC  Date Value Ref Range Status  10/08/2017 2.7 (L) 4.0 - 10.5 K/uL Final  05/14/2017 3.2 (L) 4.0 - 10.5 K/uL Final  04/04/2017 3.5 (L) 3.8 - 10.8 K/uL Final  04/01/2017 4.0 4.0 - 10.5 K/uL Final    EKG  EKG Interpretation  Date/Time:  Tuesday October 08 2017 17:25:01 EDT Ventricular Rate:  54 PR Interval:    QRS Duration: 83 QT Interval:  428 QTC Calculation: 406 R Axis:   18 Text Interpretation:  Sinus rhythm Abnormal R-wave progression, early transition LVH with secondary repolarization abnormality Normal sinus rhythm Confirmed by Corlis Leak, Courteney ([id number]) on 10/08/2017 9:01:27 PM       Radiology Dg Chest 2 View  Result Date: 10/08/2017 CLINICAL DATA:  Cough EXAM: CHEST  2 VIEW COMPARISON:  01/26/2017 FINDINGS: The heart size and mediastinal contours are within normal limits. Both lungs are clear. The visualized skeletal structures are unremarkable. IMPRESSION: No active cardiopulmonary disease. Electronically Signed   By: Marlan Palau M.D.   On: 10/08/2017 20:12    Procedures Procedures (including critical care time)  Medications  Ordered in ED Medications - No data to  display   Initial Impression / Assessment and Plan / ED Course  I have reviewed the triage vital signs and the nursing notes.  Pertinent labs & imaging results that were available during my care of the patient were reviewed by me and considered in my medical decision making (see chart for details).     Patient presents for evaluation following an episode of lightheadedness.  Patient symptoms resolved prior to ED arrival and did not recur.  Negative troponin greater than 6 hours from the episode. Patient is nontoxic appearing, afebrile, not tachycardic, not tachypneic, not hypotensive, maintains SPO2 of 99-100% on room air, and is in no apparent distress. Declined IV fluids. Able to pass oral fluid challenge.  PCP follow-up as needed.  Return precautions discussed.  Findings and plan of care discussed with Bary Castilla, MD. Dr. Corlis Leak personally evaluated and examined this patient.   Vitals:   10/08/17 2100 10/08/17 2200 10/08/17 2230 10/08/17 2252  BP: 132/67 139/68  134/70  Pulse: (!) 51  (!) 48 (!) 52  Resp: 13  13 14   Temp:      TempSrc:      SpO2: 100%  99% 97%  Weight:      Height:         Final Clinical Impressions(s) / ED Diagnoses   Final diagnoses:  Dizziness    New Prescriptions Discharge Medication List as of 10/08/2017 10:47 PM       Anselm Pancoast, PA-C 10/10/17 0257    Abelino Derrick, MD 10/10/17 1757

## 2017-10-08 NOTE — Discharge Instructions (Signed)
You have been seen today for an episode of dizziness.  This has not recurred.  There were some signs of some possible minor dehydration on the lab work.  You have agreed to work on correcting this by drinking plenty of water.  Your white blood cell count was noted to be lower than normal.  Please follow-up with your infectious disease doctor on this matter.  Follow-up with your primary care doctor as well.  Return to the ED as needed.

## 2017-10-08 NOTE — ED Notes (Signed)
Patient transported to X-ray 

## 2017-10-17 ENCOUNTER — Ambulatory Visit
Admission: RE | Admit: 2017-10-17 | Discharge: 2017-10-17 | Disposition: A | Discharge status: Home / Self Care | Payer: Medicare Other | Source: Ambulatory Visit | Attending: Infectious Diseases | Admitting: Infectious Diseases

## 2017-10-17 DIAGNOSIS — Z1231 Encounter for screening mammogram for malignant neoplasm of breast: Secondary | ICD-10-CM

## 2017-11-04 ENCOUNTER — Other Ambulatory Visit: Payer: Medicare Other

## 2017-11-04 ENCOUNTER — Ambulatory Visit (INDEPENDENT_AMBULATORY_CARE_PROVIDER_SITE_OTHER): Payer: Medicare Other | Admitting: *Deleted

## 2017-11-04 DIAGNOSIS — B20 Human immunodeficiency virus [HIV] disease: Secondary | ICD-10-CM

## 2017-11-04 DIAGNOSIS — Z23 Encounter for immunization: Secondary | ICD-10-CM

## 2017-11-04 DIAGNOSIS — D649 Anemia, unspecified: Secondary | ICD-10-CM

## 2017-11-18 ENCOUNTER — Other Ambulatory Visit: Payer: Self-pay

## 2017-11-20 ENCOUNTER — Other Ambulatory Visit: Payer: Medicare Other

## 2017-11-20 DIAGNOSIS — B20 Human immunodeficiency virus [HIV] disease: Secondary | ICD-10-CM

## 2017-11-20 LAB — CBC WITH DIFFERENTIAL/PLATELET
BASOS ABS: 21 {cells}/uL (ref 0–200)
BASOS PCT: 0.6 %
EOS ABS: 32 {cells}/uL (ref 15–500)
EOS PCT: 0.9 %
HEMATOCRIT: 35.1 % (ref 35.0–45.0)
HEMOGLOBIN: 11.2 g/dL — AB (ref 11.7–15.5)
LYMPHS ABS: 1859 {cells}/uL (ref 850–3900)
MCH: 24.6 pg — ABNORMAL LOW (ref 27.0–33.0)
MCHC: 31.9 g/dL — AB (ref 32.0–36.0)
MCV: 77.1 fL — ABNORMAL LOW (ref 80.0–100.0)
MONOS PCT: 14.5 %
MPV: 12 fL (ref 7.5–12.5)
NEUTROS ABS: 1082 {cells}/uL — AB (ref 1500–7800)
Neutrophils Relative %: 30.9 %
Platelets: 157 10*3/uL (ref 140–400)
RBC: 4.55 10*6/uL (ref 3.80–5.10)
RDW: 14.6 % (ref 11.0–15.0)
Total Lymphocyte: 53.1 %
WBC mixed population: 508 cells/uL (ref 200–950)
WBC: 3.5 10*3/uL — ABNORMAL LOW (ref 3.8–10.8)

## 2017-11-20 LAB — COMPLETE METABOLIC PANEL WITH GFR
AG Ratio: 0.9 (calc) — ABNORMAL LOW (ref 1.0–2.5)
ALT: 8 U/L (ref 6–29)
AST: 17 U/L (ref 10–35)
Albumin: 4.1 g/dL (ref 3.6–5.1)
Alkaline phosphatase (APISO): 50 U/L (ref 33–130)
BUN/Creatinine Ratio: 14 (calc) (ref 6–22)
BUN: 16 mg/dL (ref 7–25)
CALCIUM: 9.7 mg/dL (ref 8.6–10.4)
CO2: 23 mmol/L (ref 20–32)
Chloride: 104 mmol/L (ref 98–110)
Creat: 1.14 mg/dL — ABNORMAL HIGH (ref 0.50–0.99)
GFR, EST AFRICAN AMERICAN: 57 mL/min/{1.73_m2} — AB (ref >=60)
GFR, Est Non African American: 49 mL/min/{1.73_m2} — ABNORMAL LOW (ref >=60)
GLUCOSE: 101 mg/dL — AB (ref 65–99)
Globulin: 4.5 g/dL (calc) — ABNORMAL HIGH (ref 1.9–3.7)
POTASSIUM: 4.1 mmol/L (ref 3.5–5.3)
Sodium: 136 mmol/L (ref 135–146)
TOTAL PROTEIN: 8.6 g/dL — AB (ref 6.1–8.1)
Total Bilirubin: 0.5 mg/dL (ref 0.2–1.2)

## 2017-11-21 LAB — T-HELPER CELL (CD4) - (RCID CLINIC ONLY)
CD4 % Helper T Cell: 26 % — ABNORMAL LOW (ref 33–55)
CD4 T Cell Abs: 520 /uL (ref 400–2700)

## 2017-11-22 LAB — HIV-1 RNA QUANT-NO REFLEX-BLD
HIV 1 RNA Quant: 349 copies/mL — ABNORMAL HIGH
HIV-1 RNA Quant, Log: 2.54 Log copies/mL — ABNORMAL HIGH

## 2017-11-26 ENCOUNTER — Encounter: Payer: Self-pay | Admitting: Infectious Diseases

## 2017-11-26 ENCOUNTER — Ambulatory Visit (INDEPENDENT_AMBULATORY_CARE_PROVIDER_SITE_OTHER): Payer: Medicare Other | Admitting: Infectious Diseases

## 2017-11-26 VITALS — BP 104/64 | HR 66 | Temp 99.4°F | Wt 135.4 lb

## 2017-11-26 DIAGNOSIS — K5900 Constipation, unspecified: Secondary | ICD-10-CM | POA: Diagnosis not present

## 2017-11-26 DIAGNOSIS — N63 Unspecified lump in unspecified breast: Secondary | ICD-10-CM

## 2017-11-26 DIAGNOSIS — B20 Human immunodeficiency virus [HIV] disease: Secondary | ICD-10-CM

## 2017-11-26 DIAGNOSIS — Z113 Encounter for screening for infections with a predominantly sexual mode of transmission: Secondary | ICD-10-CM

## 2017-11-26 DIAGNOSIS — Z79899 Other long term (current) drug therapy: Secondary | ICD-10-CM

## 2017-11-26 DIAGNOSIS — I10 Essential (primary) hypertension: Secondary | ICD-10-CM

## 2017-11-26 NOTE — Assessment & Plan Note (Signed)
Most recent mammo benign.

## 2017-11-26 NOTE — Assessment & Plan Note (Signed)
She is doing very well of rx She is hesitant to start rx now.  Will continue to observe.  Has gotten flu vax Has had colon/mammo/pap

## 2017-11-26 NOTE — Assessment & Plan Note (Signed)
She takes small dose HCTZ and is doing well.

## 2017-11-26 NOTE — Assessment & Plan Note (Signed)
She takes prn OTCs.  Will continue to follow.

## 2017-11-26 NOTE — Progress Notes (Signed)
   Subjective:    Patient ID: Tina Olson, female    DOB: 02/15/1946, 71 y.o.   MRN: [id number]  HPI 71yo F with HIV+/elite controller (can't do study as she is a hard stick), HTN and Hepatitis C (1b). Was started on harvoni in July 2015 for her Hep C (VL 6.5 million) and F0 elastography/ultrasound. She was not able to tolerate this (thinks she took ~ 1/2 of the rx, didn't get last bottle). Her Hep C RNA has been negative (10-04-14) & (06-27-15)  She was in ED 10-08-17 with dizziness. Told she had low WBC and that she is dehydrated.  Has not had further dizziness. Now has what she feels are anxiety episodes. She has stress from living with bipolar son.   Mammo normal 10-2017.  Pap normal 07-2017. Colon/Endo 06-2017  HIV 1 RNA Quant (copies/mL)  Date Value  11/20/2017 349 (H)  04/04/2017 560 (H)  10/23/2016 585 (H)   CD4 T Cell Abs (/uL)  Date Value  11/20/2017 520  04/04/2017 470  10/23/2016 660    Review of Systems  Constitutional: Negative for appetite change and unexpected weight change.  Gastrointestinal: Positive for constipation. Negative for abdominal pain and diarrhea.  Genitourinary: Negative for difficulty urinating.  Neurological: Positive for dizziness.  takes prn miralax, senokot.  Please see HPI. All other systems reviewed and negative.     Objective:   Physical Exam  Constitutional: She appears well-developed and well-nourished.  HENT:  Mouth/Throat: No oropharyngeal exudate.  Eyes: EOM are normal. Pupils are equal, round, and reactive to light.  Neck: Neck supple.  Cardiovascular: Normal rate, regular rhythm and normal heart sounds.  Pulmonary/Chest: Effort normal and breath sounds normal.  Abdominal: Soft. Bowel sounds are normal. There is no tenderness. There is no rebound.  Musculoskeletal: She exhibits no edema.  Lymphadenopathy:    She has no cervical adenopathy.  Psychiatric: She has a normal mood and affect.       Assessment & Plan:

## 2018-05-14 ENCOUNTER — Other Ambulatory Visit: Payer: Medicare Other

## 2018-05-14 DIAGNOSIS — B20 Human immunodeficiency virus [HIV] disease: Secondary | ICD-10-CM

## 2018-05-14 DIAGNOSIS — Z113 Encounter for screening for infections with a predominantly sexual mode of transmission: Secondary | ICD-10-CM

## 2018-05-14 DIAGNOSIS — Z79899 Other long term (current) drug therapy: Secondary | ICD-10-CM

## 2018-05-15 LAB — T-HELPER CELL (CD4) - (RCID CLINIC ONLY)
CD4 T CELL ABS: 530 /uL (ref 400–2700)
CD4 T CELL HELPER: 26 % — AB (ref 33–55)

## 2018-05-16 LAB — HIV-1 RNA QUANT-NO REFLEX-BLD
HIV 1 RNA Quant: 504 copies/mL — ABNORMAL HIGH
HIV-1 RNA QUANT, LOG: 2.7 {Log_copies}/mL — AB

## 2018-05-28 ENCOUNTER — Other Ambulatory Visit: Payer: Medicare Other

## 2018-05-28 ENCOUNTER — Other Ambulatory Visit (HOSPITAL_COMMUNITY)
Admission: RE | Admit: 2018-05-28 | Discharge: 2018-05-28 | Disposition: A | Discharge status: Home / Self Care | Payer: Medicare Other | Source: Ambulatory Visit | Attending: Infectious Diseases | Admitting: Infectious Diseases

## 2018-05-28 ENCOUNTER — Ambulatory Visit: Payer: Self-pay | Admitting: Infectious Diseases

## 2018-05-28 DIAGNOSIS — Z113 Encounter for screening for infections with a predominantly sexual mode of transmission: Secondary | ICD-10-CM | POA: Diagnosis present

## 2018-05-28 DIAGNOSIS — B20 Human immunodeficiency virus [HIV] disease: Secondary | ICD-10-CM | POA: Diagnosis present

## 2018-05-28 LAB — COMPLETE METABOLIC PANEL WITH GFR
AG Ratio: 1 (calc) (ref 1.0–2.5)
ALBUMIN MSPROF: 4.1 g/dL (ref 3.6–5.1)
ALT: 9 U/L (ref 6–29)
AST: 20 U/L (ref 10–35)
Alkaline phosphatase (APISO): 48 U/L (ref 33–130)
BUN/Creatinine Ratio: 13 (calc) (ref 6–22)
BUN: 13 mg/dL (ref 7–25)
CALCIUM: 9.5 mg/dL (ref 8.6–10.4)
CO2: 26 mmol/L (ref 20–32)
CREATININE: 1.04 mg/dL — AB (ref 0.60–0.93)
Chloride: 101 mmol/L (ref 98–110)
GFR, EST AFRICAN AMERICAN: 63 mL/min/{1.73_m2} (ref >=60)
GFR, EST NON AFRICAN AMERICAN: 54 mL/min/{1.73_m2} — AB (ref >=60)
GLOBULIN: 4.2 g/dL — AB (ref 1.9–3.7)
Glucose, Bld: 103 mg/dL — ABNORMAL HIGH (ref 65–99)
Potassium: 3.1 mmol/L — ABNORMAL LOW (ref 3.5–5.3)
SODIUM: 137 mmol/L (ref 135–146)
TOTAL PROTEIN: 8.3 g/dL — AB (ref 6.1–8.1)
Total Bilirubin: 0.6 mg/dL (ref 0.2–1.2)

## 2018-05-28 LAB — CBC WITH DIFFERENTIAL/PLATELET
Basophils Absolute: 21 cells/uL (ref 0–200)
Basophils Relative: 0.7 %
EOS ABS: 30 {cells}/uL (ref 15–500)
Eosinophils Relative: 1 %
HCT: 32.1 % — ABNORMAL LOW (ref 35.0–45.0)
HEMOGLOBIN: 10.3 g/dL — AB (ref 11.7–15.5)
Lymphs Abs: 1854 cells/uL (ref 850–3900)
MCH: 23.4 pg — AB (ref 27.0–33.0)
MCHC: 32.1 g/dL (ref 32.0–36.0)
MCV: 72.8 fL — AB (ref 80.0–100.0)
MPV: 11.8 fL (ref 7.5–12.5)
Monocytes Relative: 12.5 %
Neutro Abs: 720 cells/uL — ABNORMAL LOW (ref 1500–7800)
Neutrophils Relative %: 24 %
Platelets: 151 10*3/uL (ref 140–400)
RBC: 4.41 10*6/uL (ref 3.80–5.10)
RDW: 16.2 % — ABNORMAL HIGH (ref 11.0–15.0)
TOTAL LYMPHOCYTE: 61.8 %
WBC: 3 10*3/uL — ABNORMAL LOW (ref 3.8–10.8)
WBCMIX: 375 {cells}/uL (ref 200–950)

## 2018-05-29 ENCOUNTER — Encounter (HOSPITAL_COMMUNITY): Payer: Self-pay | Admitting: Emergency Medicine

## 2018-05-29 ENCOUNTER — Telehealth: Payer: Self-pay | Admitting: Infectious Diseases

## 2018-05-29 ENCOUNTER — Ambulatory Visit (HOSPITAL_COMMUNITY)
Admission: EM | Admit: 2018-05-29 | Discharge: 2018-05-29 | Disposition: A | Discharge status: Home / Self Care | Payer: Medicare Other | Attending: Family Medicine | Admitting: Family Medicine

## 2018-05-29 DIAGNOSIS — E876 Hypokalemia: Secondary | ICD-10-CM | POA: Diagnosis not present

## 2018-05-29 DIAGNOSIS — D649 Anemia, unspecified: Secondary | ICD-10-CM

## 2018-05-29 LAB — POCT I-STAT, CHEM 8
BUN: 10 mg/dL (ref 6–20)
CHLORIDE: 100 mmol/L — AB (ref 101–111)
CREATININE: 1 mg/dL (ref 0.44–1.00)
Calcium, Ion: 1.17 mmol/L (ref 1.15–1.40)
Glucose, Bld: 121 mg/dL — ABNORMAL HIGH (ref 65–99)
HEMATOCRIT: 33 % — AB (ref 36.0–46.0)
HEMOGLOBIN: 11.2 g/dL — AB (ref 12.0–15.0)
Potassium: 2.8 mmol/L — ABNORMAL LOW (ref 3.5–5.1)
Sodium: 141 mmol/L (ref 135–145)
TCO2: 26 mmol/L (ref 22–32)

## 2018-05-29 LAB — URINE CYTOLOGY ANCILLARY ONLY
CHLAMYDIA, DNA PROBE: NEGATIVE
NEISSERIA GONORRHEA: NEGATIVE

## 2018-05-29 MED ORDER — POTASSIUM CHLORIDE ER 10 MEQ PO TBCR: 20.0000 meq | EXTENDED_RELEASE_TABLET | Freq: Every day | ORAL | 0 refills | Status: DC

## 2018-05-29 NOTE — Telephone Encounter (Signed)
Called Tina Olson about her low potassium level.  I asked to go to Urgent Care to have it rechecked tonight She is without symptoms.  She agreed to be evaluated tonight

## 2018-05-29 NOTE — ED Provider Notes (Signed)
[facility] CARE CENTER   [id number] 05/29/18 Arrival Time: 1916   SUBJECTIVE:  Tina Olson is a 72 y.o. female who presents to the urgent care with complaint of "low potassium."  Patient explains that her doctor sent her in with instructions to get her potassium checked because she had a "low potassium".     Past Medical History:  Diagnosis Date  . Acid reflux   . Anemia   . Hepatitis C   . HIV infection (HCC)   . Hypertension    Family History  Problem Relation Age of Onset  . Alzheimer's disease Mother   . Glaucoma Mother   . Hypertension Mother   . Cancer Father        ? type  . Seizures Son    Social History   Socioeconomic History  . Marital status: Widowed    Spouse name: Not on file  . Number of children: Not on file  . Years of education: Not on file  . Highest education level: Not on file  Occupational History  . Not on file  Social Needs  . Financial resource strain: Not on file  . Food insecurity:    Worry: Not on file    Inability: Not on file  . Transportation needs:    Medical: Not on file    Non-medical: Not on file  Tobacco Use  . Smoking status: Former Smoker    Types: Cigarettes    Last attempt to quit: 12/10/2000    Years since quitting: 17.4  . Smokeless tobacco: Never Used  Substance and Sexual Activity  . Alcohol use: No    Alcohol/week: 0.0 oz  . Drug use: No    Types: IV    Comment: clean since around 2000  . Sexual activity: Never    Comment: pt. declined condoms  Lifestyle  . Physical activity:    Days per week: Not on file    Minutes per session: Not on file  . Stress: Not on file  Relationships  . Social connections:    Talks on phone: Not on file    Gets together: Not on file    Attends religious service: Not on file    Active member of club or organization: Not on file    Attends meetings of clubs or organizations: Not on file    Relationship status: Not on file  . Intimate partner violence:    Fear of  current or ex partner: Not on file    Emotionally abused: Not on file    Physically abused: Not on file    Forced sexual activity: Not on file  Other Topics Concern  . Not on file  Social History Narrative  . Not on file   No outpatient medications have been marked as taking for the 05/29/18 encounter [facility] Encounter).   Allergies  Allergen Reactions  . Lisinopril Itching and Cough  . Other Hives and Itching     Seafood crustaceans  "itchy throat"      ROS: As per HPI, remainder of ROS negative.   OBJECTIVE:   Vitals:   05/29/18 1929  BP: (!) 150/83  Pulse: (!) 59  Resp: 18  Temp: 99.6 F (37.6 C)  TempSrc: Oral  SpO2: 100%     General appearance: alert; no distress Eyes: PERRL; EOMI; conjunctiva normal HENT: normocephalic; atraumatic; TMs normal, canal normal, external ears normal without trauma; nasal mucosa normal; oral mucosa normal Neck: supple Lungs: clear to auscultation bilaterally Heart: regular rate  and rhythm Abdomen: soft, non-tender; bowel sounds normal; no masses or organomegaly; no guarding or rebound tenderness Back: no CVA tenderness Extremities: no cyanosis or edema; symmetrical with no gross deformities Skin: warm and dry Neurologic: normal gait; grossly normal Psychological: alert and cooperative; normal mood and affect      Labs:  Results for orders placed or performed during the hospital encounter of 05/29/18  I-STAT, chem 8  Result Value Ref Range   Sodium 141 135 - 145 mmol/L   Potassium 2.8 (L) 3.5 - 5.1 mmol/L   Chloride 100 (L) 101 - 111 mmol/L   BUN 10 6 - 20 mg/dL   Creatinine, Ser 1.611.00 0.44 - 1.00 mg/dL   Glucose, Bld 096121 (H) 65 - 99 mg/dL   Calcium, Ion 0.451.17 4.091.15 - 1.40 mmol/L   TCO2 26 22 - 32 mmol/L   Hemoglobin 11.2 (L) 12.0 - 15.0 g/dL   HCT 81.133.0 (L) 91.436.0 - 78.246.0 %    Labs Reviewed  POCT I-STAT, CHEM 8 - Abnormal; Notable for the following components:      Result Value   Potassium 2.8 (*)    Chloride 100  (*)    Glucose, Bld 121 (*)    Hemoglobin 11.2 (*)    HCT 33.0 (*)    All other components within normal limits    No results found.     ASSESSMENT & PLAN:  1. Hypokalemia   2. Anemia, unspecified type     Meds ordered this encounter  Medications  . potassium chloride (K-DUR) 10 MEQ tablet    Sig: Take 2 tablets (20 mEq total) by mouth daily.    Dispense:  14 tablet    Refill:  0    Reviewed expectations re: course of current medical issues. Questions answered. Outlined signs and symptoms indicating need for more acute intervention. Patient verbalized understanding. After Visit Summary given.    Procedures:      Elvina SidleLauenstein, Sparrow Sanzo, MD 06/01/18 1138

## 2018-05-29 NOTE — ED Triage Notes (Signed)
Pt states her PCP called her and told her potassium was low and to get to an ER or urgent care.

## 2018-06-02 ENCOUNTER — Ambulatory Visit (INDEPENDENT_AMBULATORY_CARE_PROVIDER_SITE_OTHER): Payer: Medicare Other | Admitting: Infectious Diseases

## 2018-06-02 VITALS — BP 170/71 | HR 54 | Temp 98.2°F | Wt 136.0 lb

## 2018-06-02 DIAGNOSIS — I10 Essential (primary) hypertension: Secondary | ICD-10-CM | POA: Diagnosis not present

## 2018-06-02 DIAGNOSIS — B20 Human immunodeficiency virus [HIV] disease: Secondary | ICD-10-CM | POA: Diagnosis not present

## 2018-06-02 DIAGNOSIS — E876 Hypokalemia: Secondary | ICD-10-CM

## 2018-06-02 DIAGNOSIS — Z79899 Other long term (current) drug therapy: Secondary | ICD-10-CM | POA: Diagnosis not present

## 2018-06-02 DIAGNOSIS — K5900 Constipation, unspecified: Secondary | ICD-10-CM | POA: Diagnosis not present

## 2018-06-02 LAB — BASIC METABOLIC PANEL
BUN: 12 mg/dL (ref 7–25)
CO2: 24 mmol/L (ref 20–32)
CREATININE: 0.9 mg/dL (ref 0.60–0.93)
Calcium: 9.7 mg/dL (ref 8.6–10.4)
Chloride: 107 mmol/L (ref 98–110)
GLUCOSE: 89 mg/dL (ref 65–99)
Potassium: 4.4 mmol/L (ref 3.5–5.3)
SODIUM: 138 mmol/L (ref 135–146)

## 2018-06-02 LAB — LIPID PANEL
CHOL/HDL RATIO: 4.3 (calc) (ref <5.0)
Cholesterol: 201 mg/dL — ABNORMAL HIGH (ref <200)
HDL: 47 mg/dL — AB (ref >50)
LDL Cholesterol (Calc): 129 mg/dL (calc) — ABNORMAL HIGH
NON-HDL CHOLESTEROL (CALC): 154 mg/dL — AB (ref <130)
TRIGLYCERIDES: 136 mg/dL (ref <150)

## 2018-06-02 NOTE — Assessment & Plan Note (Signed)
Will have her f/u with her PCP, [facility]?

## 2018-06-02 NOTE — Assessment & Plan Note (Signed)
She is doing well Offered/refused condoms.  Will check her lipids today as well as recheck her K+.  rtc in 9 months.

## 2018-06-02 NOTE — Assessment & Plan Note (Signed)
Doing well of senna

## 2018-06-02 NOTE — Progress Notes (Signed)
   Subjective:    Patient ID: Tina Olson, female    DOB: 06/17/1946, 72 y.o.   MRN: [id number]  HPI 72yo F with HIV+/elite controller (can't do study as she is a hard stick), HTN and Hepatitis C (1b). Was started on harvoni in July 2015 for her Hep C (VL 6.5 million) and F0 elastography/ultrasound. She was not able to tolerate this (thinks she took ~ 1/2 of the rx, didn't get last bottle). Her Hep C RNA has been negative (10-04-14) & (06-27-15)  After her last lab visit was found to have hypoK. She was sent to UC/ED and has been started on supplement (10 meq bid since 6-20).  Her BP rx was held and she quit her laxative/senna as well. Still takes miralax prn.  Has been feeling well. Nervous about taking K+ as her mother couldn't take it.   Mammo normal 10-2017.  Pap normal 07-2017. Colon/Endo 06-2017   HIV 1 RNA Quant (copies/mL)  Date Value  05/14/2018 504 (H)  11/20/2017 349 (H)  04/04/2017 560 (H)   CD4 T Cell Abs (/uL)  Date Value  05/14/2018 530  11/20/2017 520  04/04/2017 470    Review of Systems  Constitutional: Negative for appetite change and unexpected weight change.  Respiratory: Positive for cough. Negative for shortness of breath.   Cardiovascular: Negative for chest pain.  Gastrointestinal: Negative for constipation and diarrhea.  Genitourinary: Negative for difficulty urinating.  Neurological: Negative for dizziness and headaches.  Please see HPI. All other systems reviewed and negative.      Objective:   Physical Exam  Constitutional: She is oriented to person, place, and time. She appears well-developed and well-nourished.  HENT:  Mouth/Throat: No oropharyngeal exudate.  Eyes: Pupils are equal, round, and reactive to light. EOM are normal.  Neck: Normal range of motion. Neck supple.  Cardiovascular: Normal rate, regular rhythm and normal heart sounds.  Pulmonary/Chest: Effort normal and breath sounds normal.  Abdominal: Soft. Bowel sounds are normal.  There is no tenderness. There is no guarding.  Neurological: She is alert and oriented to person, place, and time.  Psychiatric: She has a normal mood and affect.      Assessment & Plan:

## 2018-06-02 NOTE — Assessment & Plan Note (Signed)
Will recheck her K+ today while she is on supplement.  She will f/u with her PCP.

## 2018-06-03 NOTE — Progress Notes (Signed)
BMP results [facility] Breeze [facility]

## 2018-07-20 IMAGING — MG DIGITAL SCREENING BILATERAL MAMMOGRAM WITH CAD
4 series · 4 of 4 positions shown · non-contrast
Comparison: Previous exam(s).

CLINICAL DATA: Screening.

EXAM:
DIGITAL SCREENING BILATERAL MAMMOGRAM WITH CAD

[L MLO]
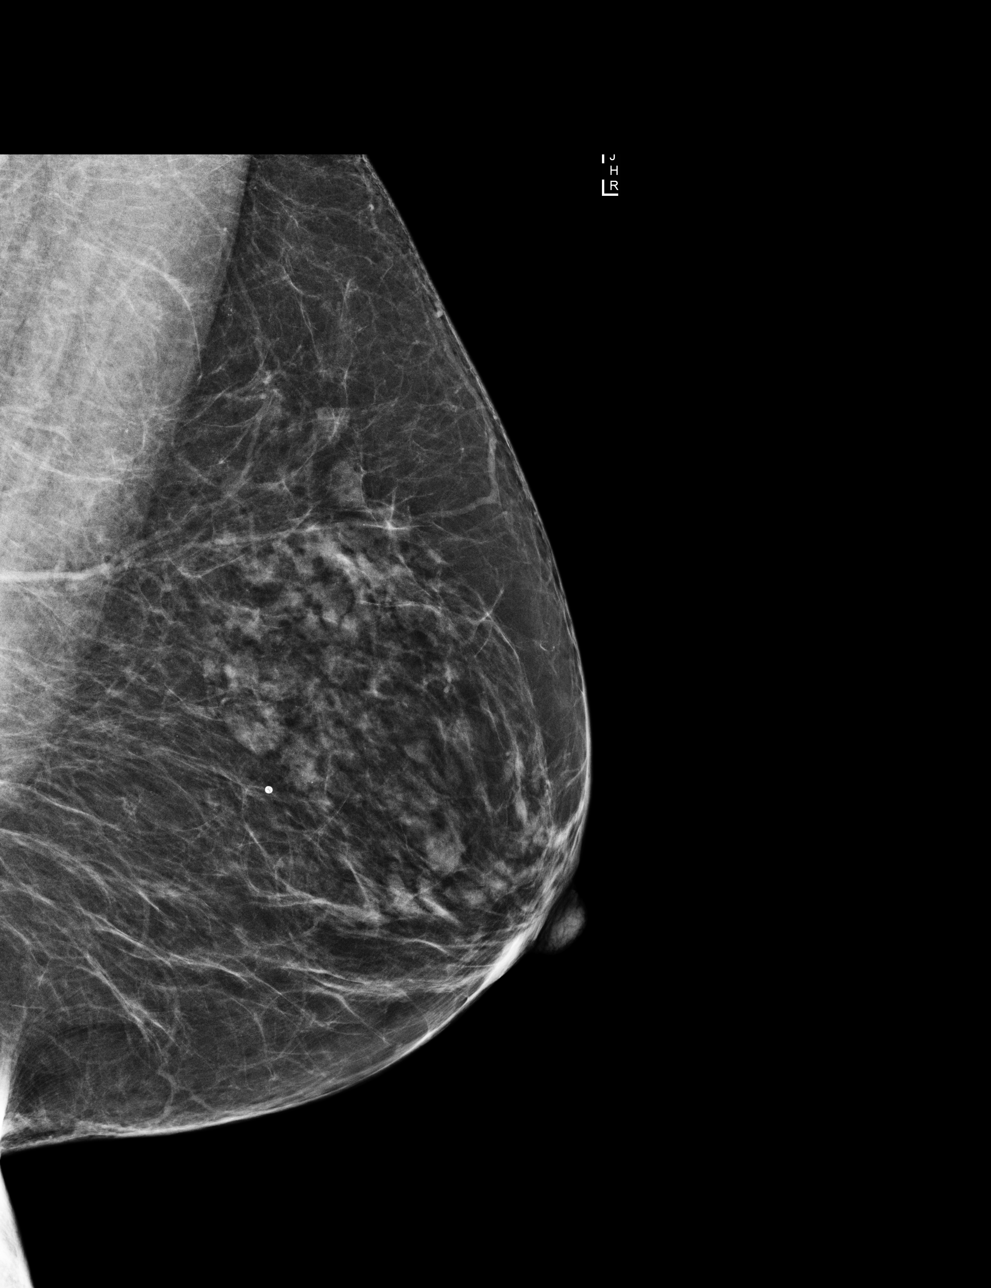

[R MLO]
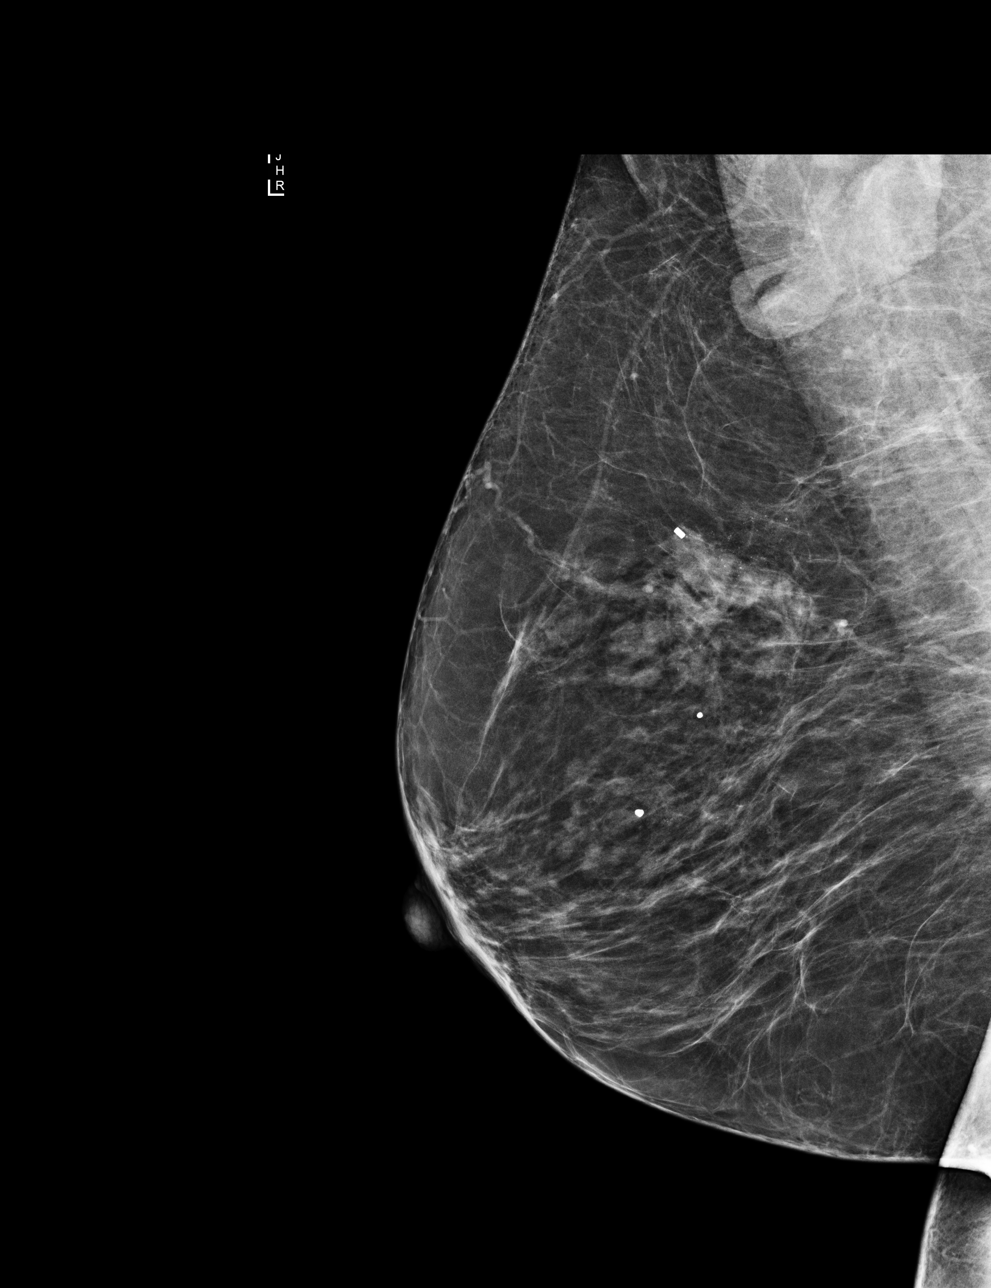

[R CC]
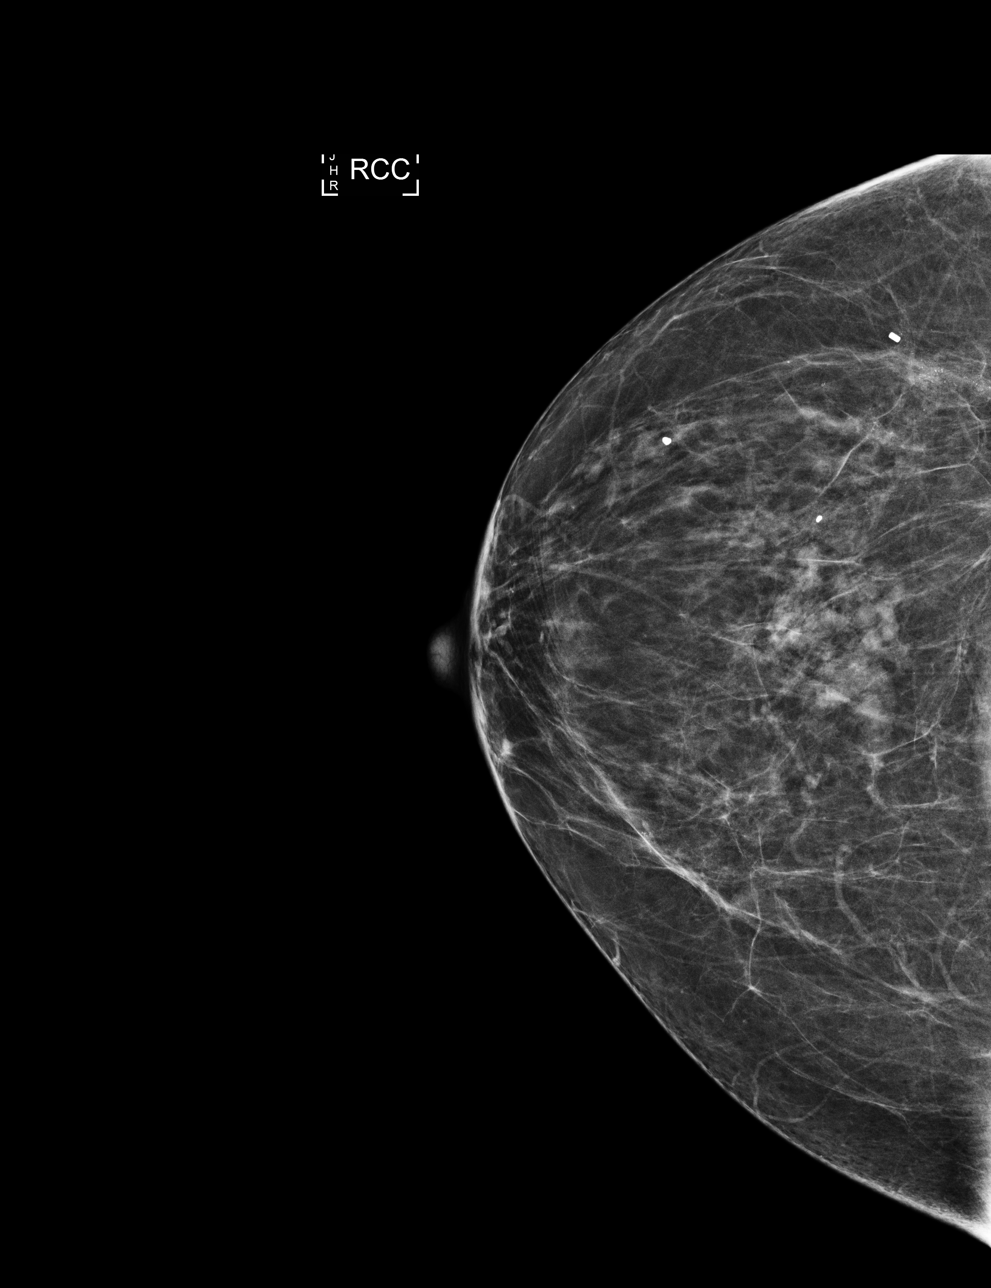

[L CC]
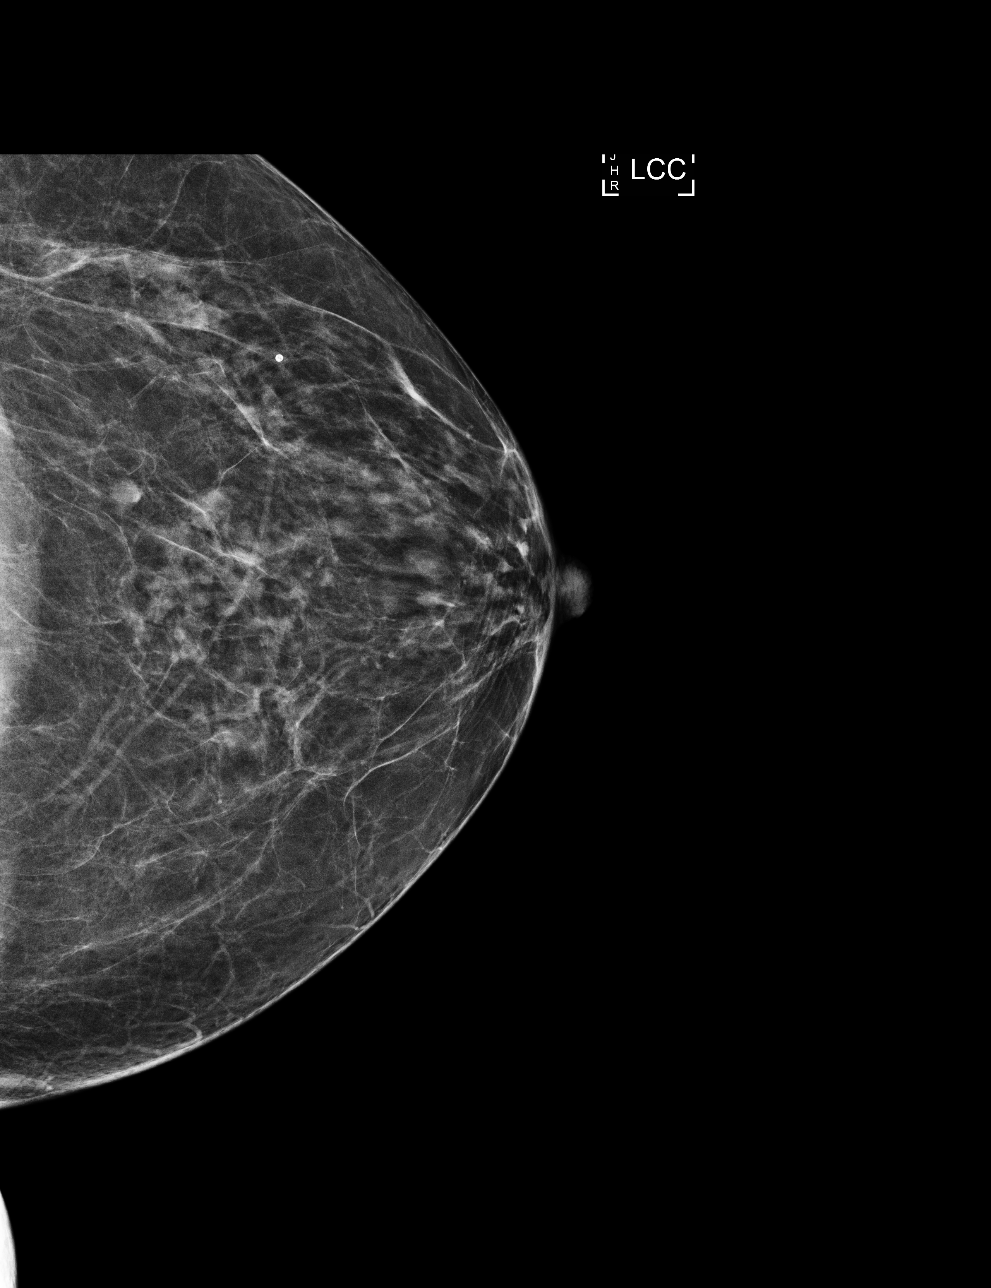

[4 of 4 positions shown; findings below may reference images not displayed]

ACR Breast Density Category b: There are scattered areas of
fibroglandular density.
FINDINGS: There are no findings suspicious for malignancy. Images were
processed with CAD.
IMPRESSION: No mammographic evidence of malignancy. A result letter of this
screening mammogram will be mailed directly to the patient.

RECOMMENDATION:
Screening mammogram in one year. (Code:AS-G-LCT)

BI-RADS CATEGORY  1: Negative.

## 2018-08-10 IMAGING — US US ART/VEN ABD/PELV/SCROTUM DOPPLER LTD
1 series · 14 of 25 positions shown · non-contrast
Comparison: None.

CLINICAL DATA: 71-year-old female with a history of upper GI
bleeding

EXAM:
DUPLEX ULTRASOUND OF LIVER
TECHNIQUE: Color and duplex Doppler ultrasound was performed to evaluate the
hepatic in-flow and out-flow vessels.

[Series 1: us art/ven abd/pelv/scrotum doppler ltd · 0.20mm/px · 14 of 32 slices shown]
[im 1/32]
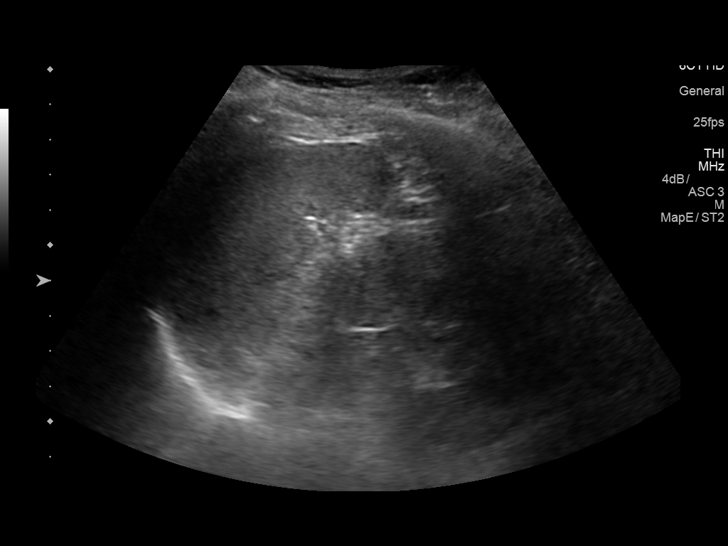
[im 3/32]
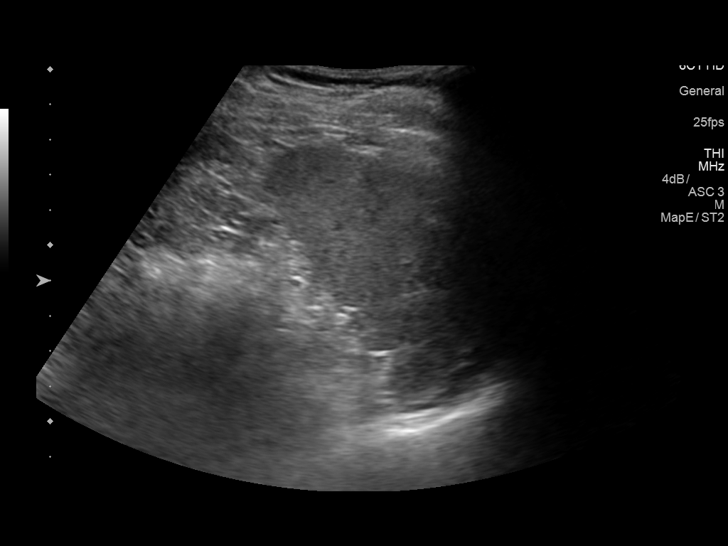
[im 6/32]
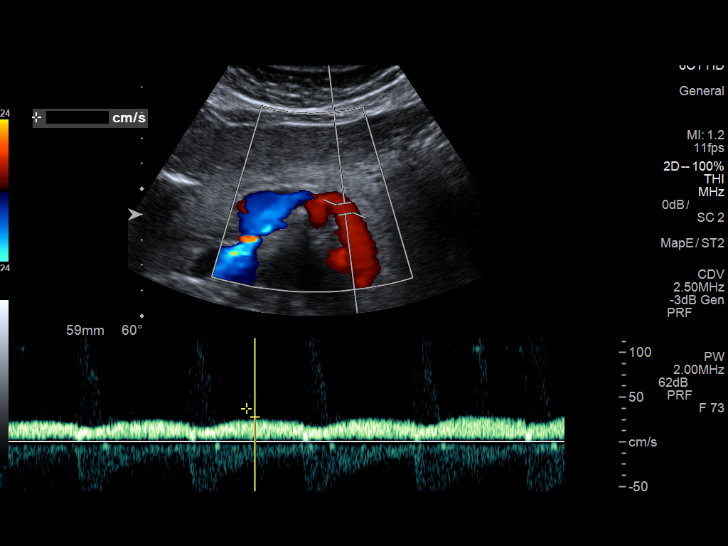
[im 8/32]
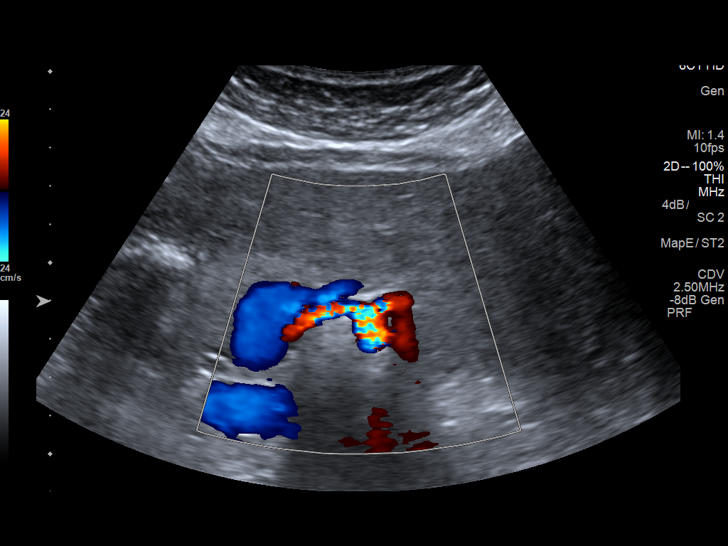
[im 11/32]
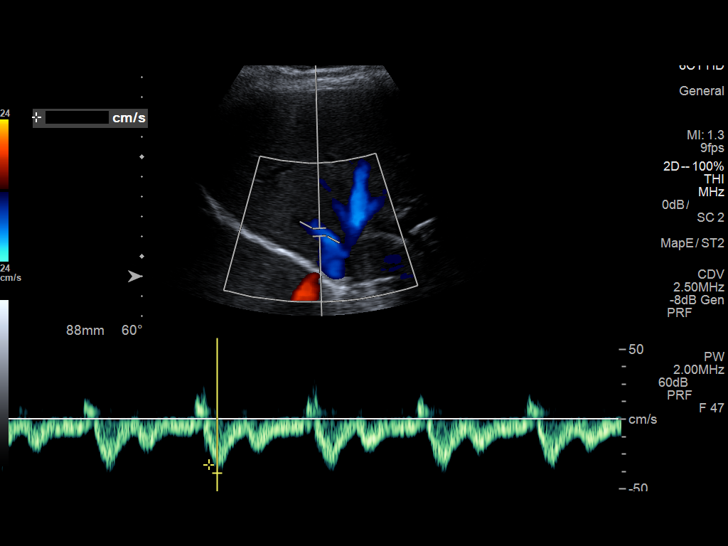
[im 12/32]
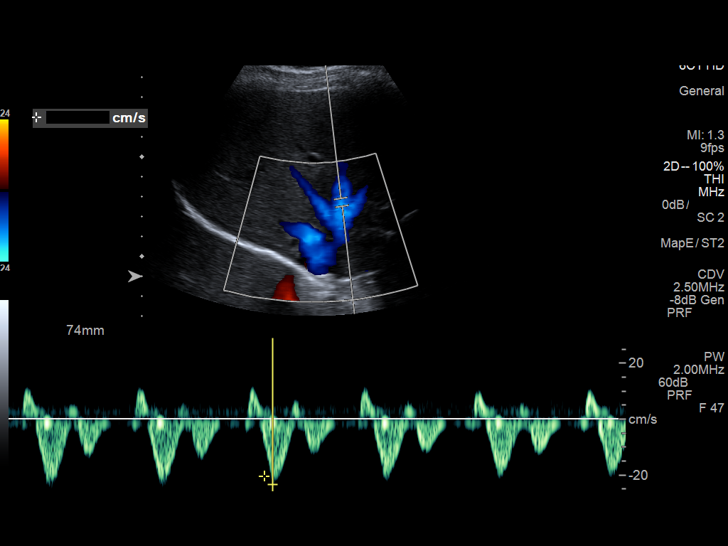
[im 15/32]
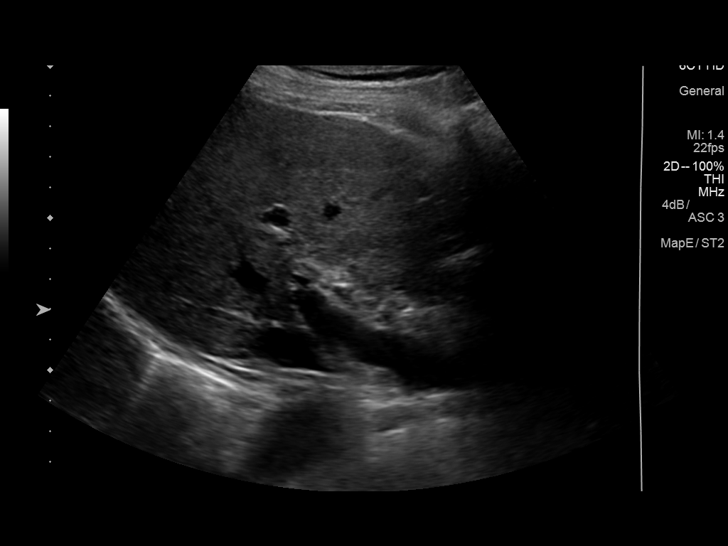
[im 17/32]
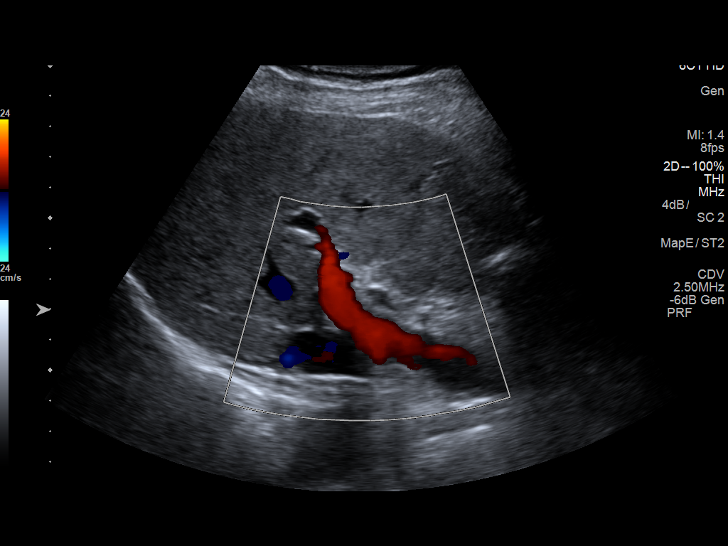
[im 20/32]
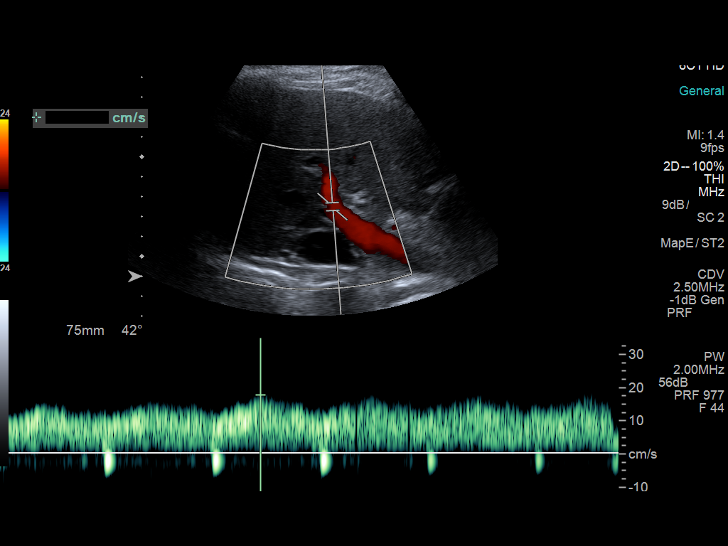
[im 21/32]
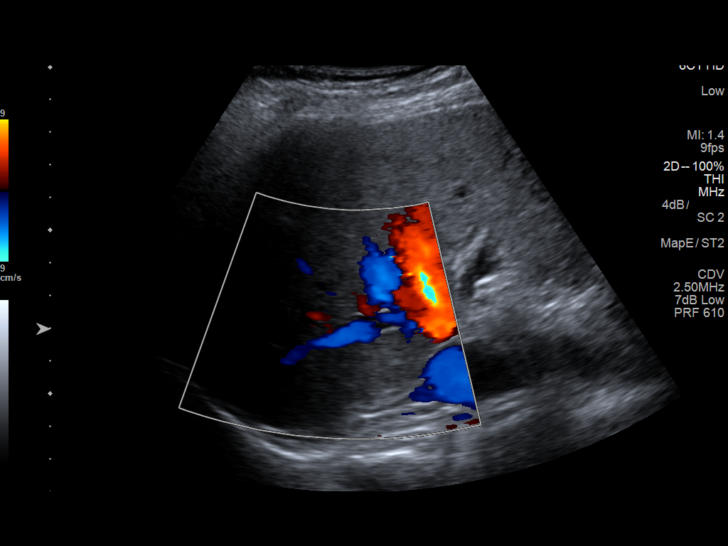
[im 24/32]
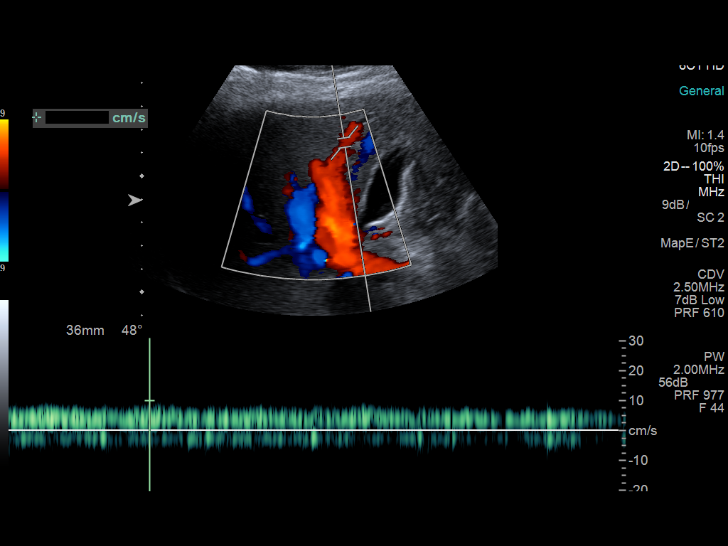
[im 26/32]
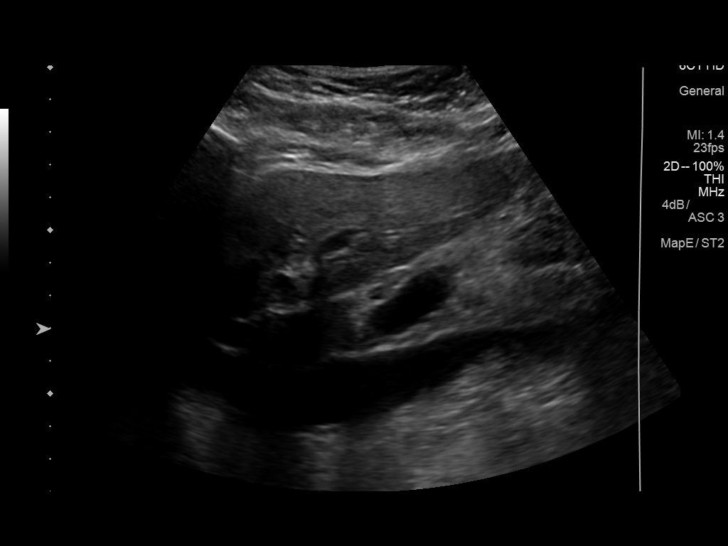
[im 29/32]
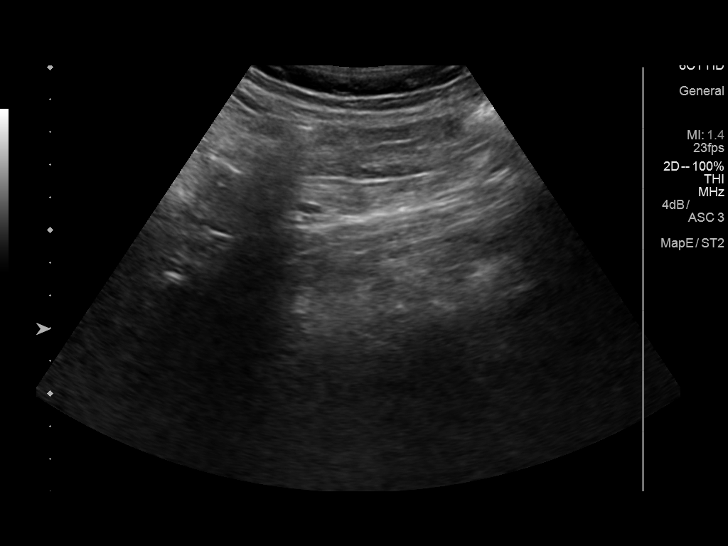
[im 32/32]
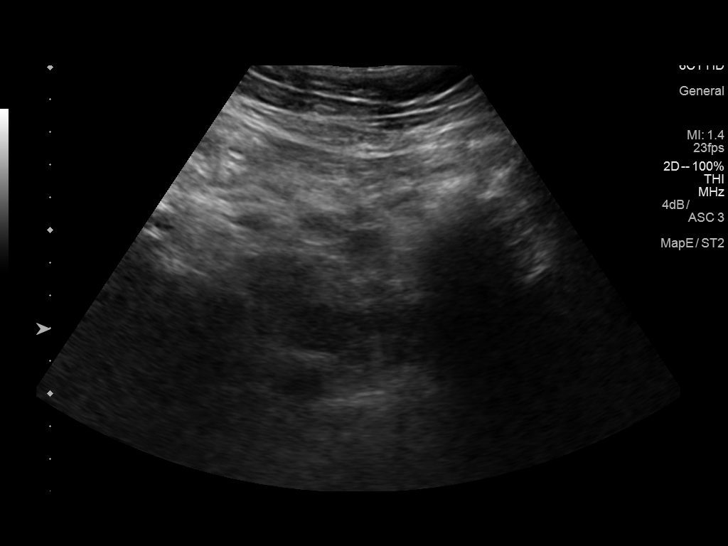

[14 of 25 positions shown; findings below may reference images not displayed]

FINDINGS: Portal Vein Velocities

Main:  26 cm/sec

Right:  13 cm/sec

Left:  10 cm/sec

Hepatic Vein Velocities

Right:  38 cm/sec

Middle:  23 cm/sec

Left:  31 cm/sec

Hepatic Artery Velocity:  175 cm/sec

Splenic Vein Velocity:  28 cm/sec

Varices: None

Ascites: None

No thrombus identified.
IMPRESSION: Unremarkable duplex ultrasound of the portal system and hepatic
venous system.

## 2018-09-22 ENCOUNTER — Telehealth: Payer: Self-pay | Admitting: *Deleted

## 2018-09-22 NOTE — Telephone Encounter (Signed)
Thanks

## 2018-09-22 NOTE — Telephone Encounter (Signed)
Prevnar 13 with PCV 23 one year later thanks

## 2018-09-22 NOTE — Telephone Encounter (Signed)
RN called patient. She has had 2 doses of Pneumovax 23 (last one was at age 72).  She wants to come here Wednesday for a flu shot, and which ever pneumonia vaccine she is due for. Please advise if she should have Pneumovax 23 or Prevnar 13 with her flu shot. Andree Coss, RN     Celedonio, Francene Boyers [facility] Triage Nurse Pool  Caller: [phone] (2 days ago, 1:53 PM)        Called patient to reschedule appointment with Dr Ninetta Lights, and patient is wondering when is she supposed to have her next pneumonia shot   Please call in the afternoon

## 2018-09-24 ENCOUNTER — Ambulatory Visit (INDEPENDENT_AMBULATORY_CARE_PROVIDER_SITE_OTHER): Payer: Medicare Other

## 2018-09-24 DIAGNOSIS — Z23 Encounter for immunization: Secondary | ICD-10-CM | POA: Diagnosis not present

## 2018-09-24 DIAGNOSIS — B20 Human immunodeficiency virus [HIV] disease: Secondary | ICD-10-CM | POA: Diagnosis not present

## 2018-09-24 NOTE — Addendum Note (Signed)
Addended by: Lurlean Leyden on: 09/24/2018 05:13 PM   Modules accepted: Orders

## 2018-12-23 ENCOUNTER — Other Ambulatory Visit: Payer: Self-pay | Admitting: Infectious Diseases

## 2018-12-23 DIAGNOSIS — Z1231 Encounter for screening mammogram for malignant neoplasm of breast: Secondary | ICD-10-CM

## 2019-01-19 ENCOUNTER — Ambulatory Visit: Payer: Self-pay

## 2019-01-19 ENCOUNTER — Ambulatory Visit
Admission: RE | Admit: 2019-01-19 | Discharge: 2019-01-19 | Disposition: A | Discharge status: Home / Self Care | Payer: Medicare Other | Source: Ambulatory Visit | Attending: Infectious Diseases | Admitting: Infectious Diseases

## 2019-01-19 DIAGNOSIS — Z1231 Encounter for screening mammogram for malignant neoplasm of breast: Secondary | ICD-10-CM

## 2019-03-03 ENCOUNTER — Other Ambulatory Visit: Payer: Self-pay

## 2019-03-03 ENCOUNTER — Other Ambulatory Visit: Payer: Medicare Other

## 2019-03-03 ENCOUNTER — Telehealth: Payer: Self-pay | Admitting: Behavioral Health

## 2019-03-03 ENCOUNTER — Other Ambulatory Visit: Payer: Self-pay | Admitting: Behavioral Health

## 2019-03-03 DIAGNOSIS — B20 Human immunodeficiency virus [HIV] disease: Secondary | ICD-10-CM

## 2019-03-03 DIAGNOSIS — E876 Hypokalemia: Secondary | ICD-10-CM

## 2019-03-03 MED ORDER — POTASSIUM CHLORIDE ER 20 MEQ PO TBCR: 40.0000 meq | EXTENDED_RELEASE_TABLET | Freq: Once | ORAL | 0 refills | Status: DC

## 2019-03-03 NOTE — Telephone Encounter (Signed)
-----   Message from [name], MD sent at 03/03/2019  3:29 PM EDT ----- Pt needs kcl orally.  Repeat blood potassium level 24h later thanks

## 2019-03-03 NOTE — Telephone Encounter (Addendum)
40 MEq of KCL orally x1 sent to [facility] on [address].  Called Earleen Newport and informed he potassium was low 2.8.  Informed her to go to [facility] on [address] to pick up a prescription for  of KCL x1 dose and then come back in 24 hours to have a BMP.  Patient verbalized understanding.  Patient states she can come back Thursday morning 03/05/2019 which will be over 24 hours but that is when she can get a ride. Angeline Slim RN

## 2019-03-04 ENCOUNTER — Other Ambulatory Visit: Payer: Self-pay

## 2019-03-04 DIAGNOSIS — B20 Human immunodeficiency virus [HIV] disease: Secondary | ICD-10-CM

## 2019-03-04 LAB — T-HELPER CELL (CD4) - (RCID CLINIC ONLY)
CD4 % Helper T Cell: 23 % — ABNORMAL LOW (ref 33–55)
CD4 T Cell Abs: 420 /uL (ref 400–2700)

## 2019-03-04 NOTE — Progress Notes (Signed)
Per Dr. Ninetta Lights patient to repeat BMP 24h after taking increased dose of potassium. Valarie Cones, LPN

## 2019-03-05 ENCOUNTER — Other Ambulatory Visit: Payer: Medicare Other

## 2019-03-05 ENCOUNTER — Other Ambulatory Visit: Payer: Self-pay

## 2019-03-05 DIAGNOSIS — B20 Human immunodeficiency virus [HIV] disease: Secondary | ICD-10-CM

## 2019-03-05 LAB — BASIC METABOLIC PANEL
BUN / CREAT RATIO: 18 (calc) (ref 6–22)
BUN: 18 mg/dL (ref 7–25)
CHLORIDE: 105 mmol/L (ref 98–110)
CO2: 24 mmol/L (ref 20–32)
CREATININE: 1.02 mg/dL — AB (ref 0.60–0.93)
Calcium: 10.2 mg/dL (ref 8.6–10.4)
GLUCOSE: 100 mg/dL — AB (ref 65–99)
Potassium: 4.3 mmol/L (ref 3.5–5.3)
Sodium: 136 mmol/L (ref 135–146)

## 2019-03-06 NOTE — Telephone Encounter (Signed)
Patient called for her lab results.  She is relieved.  Results for Tina Olson, Tina Olson (MRN [id number]) as of 03/06/2019 16:16  Ref. Range 03/03/2019 09:35 03/05/2019 09:57  Potassium Latest Ref Range: 3.5 - 5.3 mmol/L 4.2 4.3

## 2019-03-07 NOTE — Telephone Encounter (Signed)
thanks

## 2019-03-12 LAB — COMPLETE METABOLIC PANEL WITH GFR
AG Ratio: 1 (calc) (ref 1.0–2.5)
ALBUMIN MSPROF: 4.3 g/dL (ref 3.6–5.1)
ALKALINE PHOSPHATASE (APISO): 52 U/L (ref 37–153)
ALT: 8 U/L (ref 6–29)
AST: 20 U/L (ref 10–35)
BILIRUBIN TOTAL: 0.4 mg/dL (ref 0.2–1.2)
BUN / CREAT RATIO: 14 (calc) (ref 6–22)
BUN: 15 mg/dL (ref 7–25)
CO2: 21 mmol/L (ref 20–32)
Calcium: 10.1 mg/dL (ref 8.6–10.4)
Chloride: 106 mmol/L (ref 98–110)
Creat: 1.06 mg/dL — ABNORMAL HIGH (ref 0.60–0.93)
GFR, EST AFRICAN AMERICAN: 61 mL/min/{1.73_m2} (ref >=60)
GFR, Est Non African American: 53 mL/min/{1.73_m2} — ABNORMAL LOW (ref >=60)
GLOBULIN: 4.4 g/dL — AB (ref 1.9–3.7)
Glucose, Bld: 109 mg/dL — ABNORMAL HIGH (ref 65–99)
POTASSIUM: 4.2 mmol/L (ref 3.5–5.3)
Sodium: 137 mmol/L (ref 135–146)
TOTAL PROTEIN: 8.7 g/dL — AB (ref 6.1–8.1)

## 2019-03-12 LAB — CBC WITH DIFFERENTIAL/PLATELET
Absolute Monocytes: 372 cells/uL (ref 200–950)
BASOS ABS: 19 {cells}/uL (ref 0–200)
Basophils Relative: 0.6 %
EOS ABS: 19 {cells}/uL (ref 15–500)
Eosinophils Relative: 0.6 %
HCT: 35 % (ref 35.0–45.0)
HEMOGLOBIN: 11.1 g/dL — AB (ref 11.7–15.5)
Lymphs Abs: 1817 cells/uL (ref 850–3900)
MCH: 24.7 pg — AB (ref 27.0–33.0)
MCHC: 31.7 g/dL — AB (ref 32.0–36.0)
MCV: 77.8 fL — AB (ref 80.0–100.0)
MONOS PCT: 12 %
MPV: 11.5 fL (ref 7.5–12.5)
Neutro Abs: 874 cells/uL — ABNORMAL LOW (ref 1500–7800)
Neutrophils Relative %: 28.2 %
Platelets: 159 10*3/uL (ref 140–400)
RBC: 4.5 10*6/uL (ref 3.80–5.10)
RDW: 16.7 % — ABNORMAL HIGH (ref 11.0–15.0)
TOTAL LYMPHOCYTE: 58.6 %
WBC: 3.1 10*3/uL — ABNORMAL LOW (ref 3.8–10.8)

## 2019-03-12 LAB — HIV-1 RNA QUANT-NO REFLEX-BLD
HIV 1 RNA QUANT: 494 {copies}/mL — AB
HIV-1 RNA Quant, Log: 2.69 Log copies/mL — ABNORMAL HIGH

## 2019-03-17 ENCOUNTER — Encounter: Payer: Medicare Other | Admitting: Infectious Diseases

## 2019-03-17 ENCOUNTER — Encounter: Payer: Self-pay | Admitting: Infectious Diseases

## 2019-04-03 ENCOUNTER — Other Ambulatory Visit: Payer: Self-pay

## 2019-04-03 ENCOUNTER — Ambulatory Visit (INDEPENDENT_AMBULATORY_CARE_PROVIDER_SITE_OTHER): Payer: Medicare Other | Admitting: Infectious Diseases

## 2019-04-03 DIAGNOSIS — Z79899 Other long term (current) drug therapy: Secondary | ICD-10-CM | POA: Diagnosis not present

## 2019-04-03 DIAGNOSIS — Z113 Encounter for screening for infections with a predominantly sexual mode of transmission: Secondary | ICD-10-CM | POA: Diagnosis not present

## 2019-04-03 DIAGNOSIS — I10 Essential (primary) hypertension: Secondary | ICD-10-CM

## 2019-04-03 DIAGNOSIS — B20 Human immunodeficiency virus [HIV] disease: Secondary | ICD-10-CM

## 2019-04-03 NOTE — Assessment & Plan Note (Signed)
She continues to do well off ART Her health maintenance items are within range.  Flu PCV up to date Will continue to follow  See her back in 9 months

## 2019-04-03 NOTE — Assessment & Plan Note (Signed)
She is doing well Her home monitor #s are good.

## 2019-04-03 NOTE — Progress Notes (Signed)
   Subjective:    Patient ID: Tina Olson, female    DOB: 1946-04-07, 73 y.o.   MRN: [id number]  HPI 73yo F with HIV+/elite controller (can't do study as she is a hard stick), HTN and Hepatitis C (1b). Was started on harvoni in July 2015 for her Hep C (VL 6.5 million) and F0 elastography/ultrasound. She was not able to tolerate this (thinks she took ~ 1/2 of the rx, didn't get last bottle). Her Hep C RNA has been negative (10-04-14) & (06-27-15)  Mammonormal2-2020.  Pap normal6-2019. Colon/Endo 06-2017  HIV 1 RNA Quant (copies/mL)  Date Value  03/03/2019 494 (H)  05/14/2018 504 (H)  11/20/2017 349 (H)   CD4 T Cell Abs (/uL)  Date Value  03/03/2019 420  05/14/2018 530  11/20/2017 520   Has been taking OTC stool softeners with incomplete relief. She quit her prev rx due to hypokalemia.  Is tyring to eat more vegetables.  She continues to be off ART.   Took her BP this 128/68 HR 57  Review of Systems  Constitutional: Negative for appetite change, chills, fever and unexpected weight change.  Respiratory: Negative for cough and shortness of breath.   Gastrointestinal: Positive for constipation. Negative for diarrhea.  Genitourinary: Negative for difficulty urinating.  Please see HPI. All other systems reviewed and negative.      Objective:   Physical Exam Phone visit      Assessment & Plan:

## 2019-06-10 HISTORY — PX: CATARACT EXTRACTION: SUR2

## 2019-11-17 ENCOUNTER — Other Ambulatory Visit: Payer: Self-pay | Admitting: Family Medicine

## 2019-11-17 DIAGNOSIS — K4090 Unilateral inguinal hernia, without obstruction or gangrene, not specified as recurrent: Secondary | ICD-10-CM

## 2019-11-30 ENCOUNTER — Encounter: Payer: Self-pay | Admitting: Radiology

## 2019-12-08 ENCOUNTER — Ambulatory Visit
Admission: RE | Admit: 2019-12-08 | Discharge: 2019-12-08 | Disposition: A | Discharge status: Home / Self Care | Payer: Medicare Other | Source: Ambulatory Visit | Attending: Family Medicine | Admitting: Family Medicine

## 2019-12-08 DIAGNOSIS — K4090 Unilateral inguinal hernia, without obstruction or gangrene, not specified as recurrent: Secondary | ICD-10-CM

## 2019-12-08 MED ORDER — IOPAMIDOL (ISOVUE-300) INJECTION 61%
100.0000 mL | Freq: Once | INTRAVENOUS | Status: AC | PRN
  Administered 2019-12-08: 11:00:00 100 mL via INTRAVENOUS

## 2019-12-22 DIAGNOSIS — Z961 Presence of intraocular lens: Secondary | ICD-10-CM | POA: Insufficient documentation

## 2019-12-22 DIAGNOSIS — H30032 Focal chorioretinal inflammation, peripheral, left eye: Secondary | ICD-10-CM | POA: Insufficient documentation

## 2019-12-22 DIAGNOSIS — H35372 Puckering of macula, left eye: Secondary | ICD-10-CM | POA: Insufficient documentation

## 2019-12-23 ENCOUNTER — Other Ambulatory Visit: Payer: Medicare Other

## 2019-12-23 ENCOUNTER — Other Ambulatory Visit: Payer: Self-pay

## 2019-12-23 ENCOUNTER — Other Ambulatory Visit (HOSPITAL_COMMUNITY)
Admission: RE | Admit: 2019-12-23 | Discharge: 2019-12-23 | Disposition: A | Discharge status: Home / Self Care | Payer: Medicare Other | Source: Ambulatory Visit | Attending: Infectious Diseases | Admitting: Infectious Diseases

## 2019-12-23 DIAGNOSIS — Z79899 Other long term (current) drug therapy: Secondary | ICD-10-CM

## 2019-12-23 DIAGNOSIS — Z113 Encounter for screening for infections with a predominantly sexual mode of transmission: Secondary | ICD-10-CM | POA: Insufficient documentation

## 2019-12-23 DIAGNOSIS — B20 Human immunodeficiency virus [HIV] disease: Secondary | ICD-10-CM

## 2019-12-23 NOTE — Addendum Note (Signed)
Addended by: Mariea Clonts D on: 12/23/2019 10:37 AM   Modules accepted: Orders

## 2019-12-24 LAB — URINE CYTOLOGY ANCILLARY ONLY
Chlamydia: NEGATIVE
Comment: NEGATIVE
Comment: NORMAL
Neisseria Gonorrhea: NEGATIVE

## 2019-12-24 LAB — T-HELPER CELL (CD4) - (RCID CLINIC ONLY)
CD4 % Helper T Cell: 23 % — ABNORMAL LOW (ref 33–65)
CD4 T Cell Abs: 410 /uL (ref 400–1790)

## 2019-12-26 LAB — COMPREHENSIVE METABOLIC PANEL
AG Ratio: 0.9 (calc) — ABNORMAL LOW (ref 1.0–2.5)
ALT: 8 U/L (ref 6–29)
AST: 19 U/L (ref 10–35)
Albumin: 4.2 g/dL (ref 3.6–5.1)
Alkaline phosphatase (APISO): 56 U/L (ref 37–153)
BUN/Creatinine Ratio: 13 (calc) (ref 6–22)
BUN: 13 mg/dL (ref 7–25)
CO2: 19 mmol/L — ABNORMAL LOW (ref 20–32)
Calcium: 10.2 mg/dL (ref 8.6–10.4)
Chloride: 105 mmol/L (ref 98–110)
Creat: 0.97 mg/dL — ABNORMAL HIGH (ref 0.60–0.93)
Globulin: 4.5 g/dL (calc) — ABNORMAL HIGH (ref 1.9–3.7)
Glucose, Bld: 111 mg/dL — ABNORMAL HIGH (ref 65–99)
Potassium: 4.1 mmol/L (ref 3.5–5.3)
Sodium: 135 mmol/L (ref 135–146)
Total Bilirubin: 0.6 mg/dL (ref 0.2–1.2)
Total Protein: 8.7 g/dL — ABNORMAL HIGH (ref 6.1–8.1)

## 2019-12-26 LAB — CBC
HCT: 32.9 % — ABNORMAL LOW (ref 35.0–45.0)
Hemoglobin: 10.6 g/dL — ABNORMAL LOW (ref 11.7–15.5)
MCH: 24.6 pg — ABNORMAL LOW (ref 27.0–33.0)
MCHC: 32.2 g/dL (ref 32.0–36.0)
MCV: 76.3 fL — ABNORMAL LOW (ref 80.0–100.0)
MPV: 11.8 fL (ref 7.5–12.5)
Platelets: 188 10*3/uL (ref 140–400)
RBC: 4.31 10*6/uL (ref 3.80–5.10)
RDW: 15.7 % — ABNORMAL HIGH (ref 11.0–15.0)
WBC: 2.8 10*3/uL — ABNORMAL LOW (ref 3.8–10.8)

## 2019-12-26 LAB — LIPID PANEL
Cholesterol: 206 mg/dL — ABNORMAL HIGH (ref <200)
HDL: 52 mg/dL (ref >=50)
LDL Cholesterol (Calc): 132 mg/dL (calc) — ABNORMAL HIGH
Non-HDL Cholesterol (Calc): 154 mg/dL (calc) — ABNORMAL HIGH (ref <130)
Total CHOL/HDL Ratio: 4 (calc) (ref <5.0)
Triglycerides: 113 mg/dL (ref <150)

## 2019-12-26 LAB — HIV-1 RNA QUANT-NO REFLEX-BLD
HIV 1 RNA Quant: 679 copies/mL — ABNORMAL HIGH
HIV-1 RNA Quant, Log: 2.83 Log copies/mL — ABNORMAL HIGH

## 2019-12-26 LAB — RPR TITER: RPR Titer: 1:1 {titer} — ABNORMAL HIGH

## 2019-12-26 LAB — RPR: RPR Ser Ql: REACTIVE — AB

## 2019-12-26 LAB — FLUORESCENT TREPONEMAL AB(FTA)-IGG-BLD: Fluorescent Treponemal ABS: REACTIVE — AB

## 2020-01-07 ENCOUNTER — Other Ambulatory Visit: Payer: Self-pay

## 2020-01-07 ENCOUNTER — Encounter: Payer: Self-pay | Admitting: Infectious Diseases

## 2020-01-07 ENCOUNTER — Ambulatory Visit: Payer: Medicare Other | Admitting: Infectious Diseases

## 2020-01-07 VITALS — BP 147/78 | HR 63 | Wt 132.0 lb

## 2020-01-07 DIAGNOSIS — I10 Essential (primary) hypertension: Secondary | ICD-10-CM

## 2020-01-07 DIAGNOSIS — B20 Human immunodeficiency virus [HIV] disease: Secondary | ICD-10-CM

## 2020-01-07 DIAGNOSIS — R911 Solitary pulmonary nodule: Secondary | ICD-10-CM | POA: Insufficient documentation

## 2020-01-07 DIAGNOSIS — K5900 Constipation, unspecified: Secondary | ICD-10-CM | POA: Diagnosis not present

## 2020-01-07 DIAGNOSIS — Z79899 Other long term (current) drug therapy: Secondary | ICD-10-CM

## 2020-01-07 DIAGNOSIS — Z23 Encounter for immunization: Secondary | ICD-10-CM

## 2020-01-07 DIAGNOSIS — Z113 Encounter for screening for infections with a predominantly sexual mode of transmission: Secondary | ICD-10-CM

## 2020-01-07 NOTE — Assessment & Plan Note (Addendum)
Flu and PCV 23 today.  Offered/refused condoms.  Continue to watch off rx Would query if her eye issues are related to her HIV. She is being seen by a "specialist" at [facility]. Will defer to their w/u.  I offered to check her quantiferon, Lyme, ANA, AntiDS DNA, ACE level,RF,  but she wishes her ophtho to check at her f/u.  rtc in 6 so we can assure she gets repeat CT scan to f/u her lung nodules.

## 2020-01-07 NOTE — Assessment & Plan Note (Signed)
Has f/u with PCP States she takes her norvasc prn if SBP > 140 Asx.  States he elevation in clinic is due to being angry at [place] driver.

## 2020-01-07 NOTE — Progress Notes (Signed)
   Subjective:    Patient ID: [name], female    DOB: 10/03/1946, 74 y.o.   MRN: [id number]  HPI 74yo F with HIV+/elite controller (can't do study as she is a hard stick), HTN and Hepatitis C (1b). Was started on harvoni in July 2015 for her Hep C (VL 6.5 million) and F0 elastography/ultrasound. She was not able to tolerate this (thinks she took ~ 1/2 of the rx, didn't get last bottle). Her repeat Hep C RNA has been negative (10-04-14) & (06-27-15)  Mammonormal2-2020. Her mammo is pending for this year.  Pap normal6-2019. Colon/Endo 06-2017  Has been drinking prune juice, miralax to help with her constipation. She has occas issues with bloating.  She had cataract surgery end of Dec and had multiple tests ordered. She was only able to get a few of them.  She was started on valtrex 1g bid and has been on since.   She had a CT 11-2019 that showed bilateral inguinal hernias. It also showed lung nodules.    HIV 1 RNA Quant (copies/mL)  Date Value  12/23/2019 679 (H)  03/03/2019 494 (H)  05/14/2018 504 (H)   CD4 T Cell Abs (/uL)  Date Value  12/23/2019 410  03/03/2019 420  05/14/2018 530    Review of Systems  Constitutional: Negative for appetite change, chills, fever and unexpected weight change.  HENT: Negative for postnasal drip.   Respiratory: Negative for cough and shortness of breath.   Cardiovascular: Negative for chest pain.  Gastrointestinal: Positive for constipation. Negative for diarrhea.  Genitourinary: Negative for difficulty urinating.  Neurological: Negative for headaches.  Psychiatric/Behavioral: Negative for sleep disturbance.  Please see HPI. All other systems reviewed and negative.      Objective:   Physical Exam Vitals reviewed.  Constitutional:      Appearance: Normal appearance.  HENT:     Mouth/Throat:     Mouth: Mucous membranes are moist.     Pharynx: No oropharyngeal exudate.  Eyes:     Extraocular Movements: Extraocular  movements intact.     Pupils: Pupils are equal, round, and reactive to light.  Cardiovascular:     Rate and Rhythm: Normal rate and regular rhythm.  Pulmonary:     Effort: Pulmonary effort is normal.     Breath sounds: Normal breath sounds.  Abdominal:     General: Bowel sounds are normal. There is no distension.     Palpations: Abdomen is soft.     Tenderness: There is no abdominal tenderness.  Musculoskeletal:        General: Normal range of motion.     Cervical back: Normal range of motion and neck supple.     Right lower leg: No edema.     Left lower leg: No edema.  Neurological:     General: No focal deficit present.     Mental Status: She is alert.  Psychiatric:        Mood and Affect: Mood normal.           Assessment & Plan:

## 2020-01-07 NOTE — Assessment & Plan Note (Signed)
Needs repeat CT in 6 months

## 2020-01-07 NOTE — Assessment & Plan Note (Signed)
She continues to use different prns.  Will watch.  Has had colon.

## 2020-01-07 NOTE — Addendum Note (Signed)
Addended by: Rosanna Randy on: 01/07/2020 03:03 PM   Modules accepted: Orders

## 2020-02-04 ENCOUNTER — Telehealth: Payer: Self-pay

## 2020-02-04 NOTE — Telephone Encounter (Signed)
Patient is calling per Dr Clelia Croft (opthalmologist) and stating he wanted her to have an infusion for syphilis because her labs are ablnormal. Patient was not fully able to explain the providers's concern and I asked her to call the office and request sending a note to Dr Ninetta Lights with his concerns.   I looked at patient's  RPR and her titer was 1:1, with previous history of syphilis .  Patient has not been sexually active in at least 8 years.  I'm not sure if this is what the provider was referring to .   We will await notes.  Laurell Josephs, RN

## 2020-02-05 ENCOUNTER — Ambulatory Visit: Payer: Medicare Other | Attending: Internal Medicine

## 2020-02-05 DIAGNOSIS — Z23 Encounter for immunization: Secondary | ICD-10-CM

## 2020-02-05 NOTE — Progress Notes (Signed)
   Covid-19 Vaccination Clinic  Name:  Tina Olson    MRN: [id number] DOB: 1946/07/07  02/05/2020  [name] was observed post Covid-19 immunization for 15 minutes without incidence. She was provided with Vaccine Information Sheet and instruction to access the V-Safe system.   [name] was instructed to call 911 with any severe reactions post vaccine: Marland Kitchen Difficulty breathing  . Swelling of your face and throat  . A fast heartbeat  . A bad rash all over your body  . Dizziness and weakness    Immunizations Administered    Name Date Dose VIS Date Route   Pfizer COVID-19 Vaccine 02/05/2020  2:46 PM 0.3 mL 11/20/2019 Intramuscular   Manufacturer: [employer], [employer]   Lot: [id number]   NDC: [address]-5

## 2020-03-02 ENCOUNTER — Ambulatory Visit: Payer: Medicare Other | Attending: Internal Medicine

## 2020-03-02 DIAGNOSIS — Z23 Encounter for immunization: Secondary | ICD-10-CM

## 2020-03-02 NOTE — Progress Notes (Signed)
   Covid-19 Vaccination Clinic  Name:  Tina Olson    MRN: [id number] DOB: [birth year]  03/02/2020  [name] was observed post Covid-19 immunization for 15 minutes without incident. She was provided with Vaccine Information Sheet and instruction to access the V-Safe system.   [name] was instructed to call 911 with any severe reactions post vaccine: Marland Kitchen Difficulty breathing  . Swelling of face and throat  . A fast heartbeat  . A bad rash all over body  . Dizziness and weakness   Immunizations Administered    Name Date Dose VIS Date Route   Pfizer COVID-19 Vaccine 03/02/2020  2:25 PM 0.3 mL 11/20/2019 Intramuscular   Manufacturer: [employer], [employer]   Lot: [id number]   NDC: [address]-0

## 2020-03-12 ENCOUNTER — Ambulatory Visit (HOSPITAL_COMMUNITY)
Admission: EM | Admit: 2020-03-12 | Discharge: 2020-03-12 | Disposition: A | Discharge status: Home / Self Care | Payer: Medicare Other

## 2020-03-12 ENCOUNTER — Other Ambulatory Visit: Payer: Self-pay

## 2020-03-12 DIAGNOSIS — K625 Hemorrhage of anus and rectum: Secondary | ICD-10-CM

## 2020-03-12 MED ORDER — HYDROCORTISONE ACETATE 25 MG RE SUPP: 25.0000 mg | Freq: Two times a day (BID) | RECTAL | 0 refills | Status: DC | PRN

## 2020-03-12 NOTE — ED Triage Notes (Signed)
Pt states she has had flowing bright red blood when feeling urge to have bowel movement, but no stool passed. Pt states episodes have occurred several times over past week. States not wiping blood on toilet tissue when blood being passed.  Denies abdom pain, n/v/d. Pt states she has been also passing normal stools after taking miralax.

## 2020-03-12 NOTE — ED Provider Notes (Signed)
[facility]    CSN: [id number] Arrival date & time: 03/12/20  1344      History   Chief Complaint Chief Complaint  Patient presents with  . Rectal Bleeding    HPI Tina Olson is a 74 y.o. female.   [name] presents with complaints of rectal bleeding. She noted it first one week ago. She had been experiencing constipation prior to this, so had been taking miralax prune juice and did take some magnesium citrate as well. She noted bright red blood in the toilet x1 once she was finally able to pass stool. The following day noted x1 as well. No black. No abdominal pain. No nausea or vomiting. Then she did not have any blood until the past three days ago. Today she strained to try to pass a BM, didn't pass any stool, but noted she had a trickle of bright red blood from the rectum. She later passed a BM and there was no blood. She has had colonoscopy in the past, last in 2018, which have been unremarkable. She has intermittent constipation. This past week has had 1-2 stools a day and has been more regular, however. No dizziness. She has had a strong appetite and has been eating and drinking. Has a history of external hemorrhoids. Denies any previous known episodes of rectal bleeding.    ROS per HPI, negative if not otherwise mentioned.      Past Medical History:  Diagnosis Date  . Acid reflux   . Anemia   . Hepatitis C   . HIV infection (HCC)   . Hypertension     Patient Active Problem List   Diagnosis Date Noted  . Lung nodule seen on imaging study 01/07/2020  . Chest pain 01/26/2017  . Microcytic anemia 01/26/2017  . Acute kidney injury (HCC) 01/26/2017  . 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 Constipation 01/26/2017  . Hypertension 10/08/2011  . SHELLFISH ALLERGY 10/20/2009  . Human immunodeficiency virus (HIV) disease [employer]support[employer]administrative [employer]) 04/18/2009  . BREAST MASS, BENIGN 04/18/2009  . HEMATOCHEZIA, HX OF 04/18/2009    Past Surgical History:    Procedure Laterality Date  . CATARACT EXTRACTION Right 2016  . COLONOSCOPY WITH PROPOFOL N/A 06/11/2017   Procedure: COLONOSCOPY WITH PROPOFOL;  Surgeon: Napoleon Form, MD;  Location: [facility];  Service: Endoscopy;  Laterality: N/A;  PT IS KNOWN HEART STICK. WILL PROBABLY REQUIRE ULTRASOUND OR ANESTHESIA FOR IV STICK.  Marland Kitchen ESOPHAGOGASTRODUODENOSCOPY (EGD) WITH PROPOFOL N/A 06/11/2017   Procedure: ESOPHAGOGASTRODUODENOSCOPY (EGD) WITH PROPOFOL;  Surgeon: Napoleon Form, MD;  Location: [facility];  Service: Endoscopy;  Laterality: N/A;    OB History   No obstetric history on file.      Home Medications    Prior to Admission medications   Medication Sig Start Date End Date Taking? Authorizing Provider  prednisoLONE acetate (PRED FORTE) 1 % ophthalmic suspension Place 1 drop into the left eye 4 times daily. 02/02/20  Yes [provider]  valACYclovir (VALTREX) 1000 MG tablet Take by mouth. 02/02/20  Yes [provider]  acetaminophen (TYLENOL) 325 MG tablet Take 325 mg by mouth daily as needed for moderate pain or headache.    [provider]  amLODipine (NORVASC) 5 MG tablet Take 5 mg by mouth daily. 12/13/19   [provider]  BIOTIN PO Take 500 mcg by mouth daily.    [provider]  calcium carbonate (OSCAL) 1500 (600 Ca) MG TABS tablet Take 600 mg of elemental calcium by  mouth daily with breakfast.    [provider]  docusate sodium (COLACE) 100 MG capsule Take 100 mg by mouth daily as needed for mild constipation.    [provider]  fluticasone (FLONASE) 50 MCG/ACT nasal spray Place 1 spray into both nostrils daily as needed for allergies or rhinitis.     [provider]  hydrocortisone (ANUSOL-HC) 25 MG suppository Place 1 suppository (25 mg total) rectally 2 (two) times daily as needed for hemorrhoids (rectal bleeding). 03/12/20   Zigmund Gottron, NP  Omega-3 Fatty Acids (FISH OIL) 1200 MG CAPS Take 1,200  mg by mouth daily.    [provider]  omeprazole (PRILOSEC) 40 MG capsule Take 1 capsule (40 mg total) by mouth 2 (two) times daily at 8 am and 10 pm. 06/11/17   Nandigam, Venia Minks, MD  polyethylene glycol (MIRALAX / GLYCOLAX) packet Take 17 g by mouth daily as needed for mild constipation.     [provider]  potassium chloride (K-DUR) 10 MEQ tablet Take 2 tablets (20 mEq total) by mouth daily. Patient not taking: Reported on 01/07/2020 05/29/18   Robyn Haber, MD  potassium chloride 20 MEQ TBCR Take 40 mEq by mouth once for 1 dose. 03/03/19 03/03/19  Campbell Riches, MD  prednisoLONE acetate (PRED FORTE) 1 % ophthalmic suspension Place 1 drop into the left eye 4 (four) times daily. 12/22/19   [provider]  valACYclovir (VALTREX) 1000 MG tablet Take 1,000 mg by mouth 2 (two) times daily. 12/22/19   [provider]    Family History Family History  Problem Relation Age of Onset  . Alzheimer's disease Mother   . Glaucoma Mother   . Hypertension Mother   . Cancer Father        ? type  . Seizures Son     Social History Social History   Tobacco Use  . Smoking status: Former Smoker    Types: Cigarettes    Quit date: 12/10/2000    Years since quitting: 19.2  . Smokeless tobacco: Never Used  Substance Use Topics  . Alcohol use: No    Alcohol/week: 0.0 standard drinks  . Drug use: No    Types: IV    Comment: clean since around 2000     Allergies   Lisinopril and Other   Review of Systems Review of Systems   Physical Exam Triage Vital Signs ED Triage Vitals  Enc Vitals Group     BP 03/12/20 1427 (!) 153/80     Pulse Rate 03/12/20 1427 68     Resp 03/12/20 1427 18     Temp 03/12/20 1427 98.4 F (36.9 C)     Temp Source 03/12/20 1427 Oral     SpO2 03/12/20 1427 100 %     Weight --      Height --      Head Circumference --      Peak Flow --      Pain Score 03/12/20 1425 0     Pain Loc --      Pain Edu? --      Excl. in [place]? --     No data found.  Updated Vital Signs BP (!) 153/80 (BP Location: Right Arm)   Pulse 68   Temp 98.4 F (36.9 C) (Oral)   Resp 18   SpO2 100%    Physical Exam Constitutional:      General: She is not in acute distress.    Appearance: She is  well-developed.  Cardiovascular:     Rate and Rhythm: Normal rate.  Pulmonary:     Effort: Pulmonary effort is normal.  Abdominal:     General: There is no distension.     Tenderness: There is no abdominal tenderness. There is no right CVA tenderness or left CVA tenderness.  Genitourinary:    Rectum: External hemorrhoid present. No mass or tenderness.     Comments: Non tender external hemorrhoids noted, patient endorses these are not new, however; no gross blood on dre Skin:    General: Skin is warm and dry.  Neurological:     Mental Status: She is alert and oriented to person, place, and time.      UC Treatments / Results  Labs (all labs ordered are listed, but only abnormal results are displayed) Labs Reviewed - No data to display  EKG   Radiology No results found.  Procedures Procedures (including critical care time)  Medications Ordered in UC Medications - No data to display  Initial Impression / Assessment and Plan / UC Course  I have reviewed the triage vital signs and the nursing notes.  Pertinent labs & imaging results that were available during my care of the patient were reviewed by me and considered in my medical decision making (see chart for details).     Recent constipation and treatment with miralax, prune juice and magnesium with bright red blood following this, which has been intermittent. Normal colonoscopy in 2018, internal hemorrhoids were visualized at that time. BP stable. No dizziness, no abdominal pain. Discussed bowel habits and hemorrhoids, as higher suspicion for internal hemorrhoids at this time. anusol suppositories prn provided. Encouraged follow up with GI if symptoms persist . Return  precautions provided. Patient verbalized understanding and agreeable to plan.   Final Clinical Impressions(s) / UC Diagnoses   Final diagnoses:  Rectal bleeding     Discharge Instructions     I suspect internal hemorrhoids are the source of your bleeding.  Continue with regular bowel movements to prevent hemorrhoids, limit time spent on the toilet, limit straining.  Drink plenty of water and take in plenty of fiber in your diet.  May use the provided suppository as needed for bleeding.  If symptoms persist please follow up with your gastroenterologist for recheck.  Please return or go to the ER for any abdominal or rectal pain, constant bleeding, weakness or dizziness.     ED Prescriptions    Medication Sig Dispense Auth. Provider   hydrocortisone (ANUSOL-HC) 25 MG suppository Place 1 suppository (25 mg total) rectally 2 (two) times daily as needed for hemorrhoids (rectal bleeding). 12 suppository Georgetta Haber, NP     PDMP not reviewed this encounter.   Georgetta Haber, NP 03/12/20 1610

## 2020-03-12 NOTE — Discharge Instructions (Signed)
I suspect internal hemorrhoids are the source of your bleeding.  Continue with regular bowel movements to prevent hemorrhoids, limit time spent on the toilet, limit straining.  Drink plenty of water and take in plenty of fiber in your diet.  May use the provided suppository as needed for bleeding.  If symptoms persist please follow up with your gastroenterologist for recheck.  Please return or go to the ER for any abdominal or rectal pain, constant bleeding, weakness or dizziness.

## 2020-04-04 ENCOUNTER — Emergency Department (HOSPITAL_COMMUNITY)
Admission: EM | Admit: 2020-04-04 | Discharge: 2020-04-04 | Disposition: A | Discharge status: Home / Self Care | Payer: Medicare Other | Attending: Emergency Medicine | Admitting: Emergency Medicine

## 2020-04-04 ENCOUNTER — Other Ambulatory Visit: Payer: Self-pay

## 2020-04-04 ENCOUNTER — Emergency Department (HOSPITAL_COMMUNITY): Payer: Medicare Other

## 2020-04-04 DIAGNOSIS — Z87891 Personal history of nicotine dependence: Secondary | ICD-10-CM | POA: Insufficient documentation

## 2020-04-04 DIAGNOSIS — B20 Human immunodeficiency virus [HIV] disease: Secondary | ICD-10-CM | POA: Insufficient documentation

## 2020-04-04 DIAGNOSIS — I10 Essential (primary) hypertension: Secondary | ICD-10-CM | POA: Insufficient documentation

## 2020-04-04 DIAGNOSIS — R002 Palpitations: Secondary | ICD-10-CM | POA: Insufficient documentation

## 2020-04-04 DIAGNOSIS — Z79899 Other long term (current) drug therapy: Secondary | ICD-10-CM | POA: Insufficient documentation

## 2020-04-04 LAB — CBC
HCT: 34.3 % — ABNORMAL LOW (ref 36.0–46.0)
Hemoglobin: 10.1 g/dL — ABNORMAL LOW (ref 12.0–15.0)
MCH: 26 pg (ref 26.0–34.0)
MCHC: 29.4 g/dL — ABNORMAL LOW (ref 30.0–36.0)
MCV: 88.4 fL (ref 80.0–100.0)
Platelets: 82 10*3/uL — ABNORMAL LOW (ref 150–400)
RBC: 3.88 MIL/uL (ref 3.87–5.11)
RDW: 18.2 % — ABNORMAL HIGH (ref 11.5–15.5)
WBC: 3.2 10*3/uL — ABNORMAL LOW (ref 4.0–10.5)
nRBC: 0 % (ref 0.0–0.2)

## 2020-04-04 LAB — BASIC METABOLIC PANEL
Anion gap: 8 (ref 5–15)
BUN: 10 mg/dL (ref 8–23)
CO2: 23 mmol/L (ref 22–32)
Calcium: 9.8 mg/dL (ref 8.9–10.3)
Chloride: 107 mmol/L (ref 98–111)
Creatinine, Ser: 0.91 mg/dL (ref 0.44–1.00)
GFR calc Af Amer: >60 mL/min (ref >60)
GFR calc non Af Amer: >60 mL/min (ref >60)
Glucose, Bld: 109 mg/dL — ABNORMAL HIGH (ref 70–99)
Potassium: 3.5 mmol/L (ref 3.5–5.1)
Sodium: 138 mmol/L (ref 135–145)

## 2020-04-04 LAB — TROPONIN I (HIGH SENSITIVITY)
Troponin I (High Sensitivity): 5 ng/L (ref <18)
Troponin I (High Sensitivity): 6 ng/L (ref <18)

## 2020-04-04 MED ORDER — SODIUM CHLORIDE 0.9% FLUSH: 3.0000 mL | Freq: Once | INTRAVENOUS | Status: DC

## 2020-04-04 NOTE — ED Triage Notes (Signed)
Pt here for evaluation of heart palpitations yesterday and today. Called [facility] triage nurse who suggested she come here. Checked pulse yesterday while having palpitations and it was 65. Denies chest pain or shob.

## 2020-04-04 NOTE — Discharge Instructions (Signed)
Follow-up with your doctor.  Return for persistent symptoms or symptoms associated with chest pain or trouble breathing.

## 2020-04-04 NOTE — ED Provider Notes (Signed)
[facility] EMERGENCY DEPARTMENT Provider Note   CSN: [id number] Arrival date & time: 04/04/20  1059     History Chief Complaint  Patient presents with  . Palpitations    Tina Olson is a 74 y.o. female.  74 yo F with a chief complaints of palpitations.  She has had these for the past couple days.  Last for short period of time and then resolves.  She usually has a bowel movement and then it resolves.  She thinks it could be her stomach.  She also thinks it could be her anxiety.  She denies chest pain or pressure with this denies shortness of breath.  He has chronic constipation and takes MiraLAX for it.  Had hemorrhoids recently but it resolved.  No other recent medication changes.  The history is provided by the patient.  Palpitations Associated symptoms: no chest pain, no dizziness, no nausea, no shortness of breath and no vomiting   Illness Severity:  Moderate Onset quality:  Gradual Duration:  2 days Timing:  Constant Progression:  Unchanged Chronicity:  New Associated symptoms: no chest pain, no congestion, no fever, no headaches, no myalgias, no nausea, no rhinorrhea, no shortness of breath, no vomiting and no wheezing        Past Medical History:  Diagnosis Date  . Acid reflux   . Anemia   . Hepatitis C   . HIV infection (HCC)   . Hypertension     Patient Active Problem List   Diagnosis Date Noted  . Lung nodule seen on imaging study 01/07/2020  . Chest pain 01/26/2017  . Microcytic anemia 01/26/2017  . Acute kidney injury (HCC) 01/26/2017  . 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 Constipation 01/26/2017  . Hypertension 10/08/2011  . SHELLFISH ALLERGY 10/20/2009  . Human immunodeficiency virus (HIV) disease Armed forces logistics/support/administrative officer) 04/18/2009  . BREAST MASS, BENIGN 04/18/2009  . HEMATOCHEZIA, HX OF 04/18/2009    Past Surgical History:  Procedure Laterality Date  . CATARACT EXTRACTION Right 2016  . COLONOSCOPY WITH PROPOFOL N/A 06/11/2017    Procedure: COLONOSCOPY WITH PROPOFOL;  Surgeon: Napoleon Form, MD;  Location: [facility];  Service: Endoscopy;  Laterality: N/A;  PT IS KNOWN HEART STICK. WILL PROBABLY REQUIRE ULTRASOUND OR ANESTHESIA FOR IV STICK.  Marland Kitchen ESOPHAGOGASTRODUODENOSCOPY (EGD) WITH PROPOFOL N/A 06/11/2017   Procedure: ESOPHAGOGASTRODUODENOSCOPY (EGD) WITH PROPOFOL;  Surgeon: Napoleon Form, MD;  Location: [facility];  Service: Endoscopy;  Laterality: N/A;     OB History   No obstetric history on file.     Family History  Problem Relation Age of Onset  . Alzheimer's disease Mother   . Glaucoma Mother   . Hypertension Mother   . Cancer Father        ? type  . Seizures Son     Social History   Tobacco Use  . Smoking status: Former Smoker    Types: Cigarettes    Quit date: 12/10/2000    Years since quitting: 19.3  . Smokeless tobacco: Never Used  Substance Use Topics  . Alcohol use: No    Alcohol/week: 0.0 standard drinks  . 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 Authorizing Provider  acetaminophen (TYLENOL) 325 MG tablet Take 325 mg by mouth daily as needed for moderate pain or headache.    [provider]  amLODipine (NORVASC)  5 MG tablet Take 5 mg by mouth daily. 12/13/19   [provider]  BIOTIN PO Take 500 mcg by mouth daily.    [provider]  calcium carbonate (OSCAL) 1500 (600 Ca) MG TABS tablet Take 600 mg of elemental calcium by mouth daily with breakfast.    [provider]  docusate sodium (COLACE) 100 MG capsule Take 100 mg by mouth daily as needed for mild constipation.    [provider]  fluticasone (FLONASE) 50 MCG/ACT nasal spray Place 1 spray into both nostrils daily as needed for allergies or rhinitis.     [provider]  hydrocortisone (ANUSOL-HC) 25 MG suppository Place 1 suppository (25 mg  total) rectally 2 (two) times daily as needed for hemorrhoids (rectal bleeding). 03/12/20   Zigmund Gottron, NP  Omega-3 Fatty Acids (FISH OIL) 1200 MG CAPS Take 1,200 mg by mouth daily.    [provider]  omeprazole (PRILOSEC) 40 MG capsule Take 1 capsule (40 mg total) by mouth 2 (two) times daily at 8 am and 10 pm. 06/11/17   Nandigam, Venia Minks, MD  polyethylene glycol (MIRALAX / GLYCOLAX) packet Take 17 g by mouth daily as needed for mild constipation.     [provider]  potassium chloride (K-DUR) 10 MEQ tablet Take 2 tablets (20 mEq total) by mouth daily. Patient not taking: Reported on 01/07/2020 05/29/18   Robyn Haber, MD  potassium chloride 20 MEQ TBCR Take 40 mEq by mouth once for 1 dose. 03/03/19 03/03/19  Campbell Riches, MD  prednisoLONE acetate (PRED FORTE) 1 % ophthalmic suspension Place 1 drop into the left eye 4 (four) times daily. 12/22/19   [provider]  prednisoLONE acetate (PRED FORTE) 1 % ophthalmic suspension Place 1 drop into the left eye 4 times daily. 02/02/20   [provider]  valACYclovir (VALTREX) 1000 MG tablet Take 1,000 mg by mouth 2 (two) times daily. 12/22/19   [provider]  valACYclovir (VALTREX) 1000 MG tablet Take by mouth. 02/02/20   [provider]    Allergies    Lisinopril and Other  Review of Systems   Review of Systems  Constitutional: Negative for chills and fever.  HENT: Negative for congestion and rhinorrhea.   Eyes: Negative for redness and visual disturbance.  Respiratory: Negative for shortness of breath and wheezing.   Cardiovascular: Positive for palpitations. Negative for chest pain.  Gastrointestinal: Negative for nausea and vomiting.  Genitourinary: Negative for dysuria and urgency.  Musculoskeletal: Negative for arthralgias and myalgias.  Skin: Negative for pallor and wound.  Neurological: Negative for dizziness and headaches.    Physical Exam Updated Vital Signs BP (!)  153/82 (BP Location: Left Arm)   Pulse 86   Temp 98.4 F (36.9 C) (Oral)   Resp 18   SpO2 100%   Physical Exam Vitals and nursing note reviewed.  Constitutional:      General: She is not in acute distress.    Appearance: She is well-developed. She is not diaphoretic.  HENT:     Head: Normocephalic and atraumatic.  Eyes:     Pupils: Pupils are equal, round, and reactive to light.  Cardiovascular:     Rate and Rhythm: Normal rate and regular rhythm.     Heart sounds: No murmur. No friction rub. No gallop.   Pulmonary:     Effort: Pulmonary effort is normal.     Breath sounds: No wheezing or rales.  Abdominal:     General:  There is no distension.     Palpations: Abdomen is soft.     Tenderness: There is no abdominal tenderness.  Musculoskeletal:        General: No tenderness.     Cervical back: Normal range of motion and neck supple.  Skin:    General: Skin is warm and dry.  Neurological:     Mental Status: She is alert and oriented to person, place, and time.  Psychiatric:        Behavior: Behavior normal.     ED Results / Procedures / Treatments   Labs (all labs ordered are listed, but only abnormal results are displayed) Labs Reviewed  BASIC METABOLIC PANEL - Abnormal; Notable for the following components:      Result Value   Glucose, Bld 109 (*)    All other components within normal limits  CBC - Abnormal; Notable for the following components:   WBC 3.2 (*)    Hemoglobin 10.1 (*)    HCT 34.3 (*)    MCHC 29.4 (*)    RDW 18.2 (*)    Platelets 82 (*)    All other components within normal limits  TROPONIN I (HIGH SENSITIVITY)  TROPONIN I (HIGH SENSITIVITY)    EKG EKG Interpretation  Date/Time:  Monday April 04 2020 11:09:59 EDT Ventricular Rate:  68 PR Interval:  158 QRS Duration: 76 QT Interval:  358 QTC Calculation: 380 R Axis:   34 Text Interpretation:  Poor data quality, interpretation may be adversely affected Normal sinus rhythm Nonspecific ST  and T wave abnormality Abnormal ECG scooped t waves seen on prior Otherwise no significant change Confirmed by Melene Plan [phone]) on 04/04/2020 5:46:02 PM   Radiology DG Chest 2 View  Result Date: 04/04/2020 CLINICAL DATA:  Chest pain palpitations EXAM: CHEST - 2 VIEW COMPARISON:  10/08/2017 FINDINGS: Cardiomediastinal contours and hilar structures with stable appearance. Unchanged dating back to 2013. Lungs are clear. No sign of pleural effusion. Visualized skeletal structures are unremarkable. IMPRESSION: No active cardiopulmonary disease. Electronically Signed   By: Donzetta Kohut M.D.   On: 04/04/2020 11:34    Procedures Procedures (including critical care time)   "The cardiac monitor revealed sinus bradycardia as interpreted by me. The cardiac monitor was ordered secondary to the patient's history of palpitations and to monitor the patient for dysrhythmia."   Medications Ordered in ED Medications  sodium chloride flush (NS) 0.9 % injection 3 mL (has no administration in time range)    ED Course  I have reviewed the triage vital signs and the nursing notes.  Pertinent labs & imaging results that were available during my care of the patient were reviewed by me and considered in my medical decision making (see chart for details).    MDM Rules/Calculators/A&P                      74 yo F with a chief complaint of palpitations.  She describes it as a funny feeling in her chest.  Last for very short time and then resolves.  Seems to correspond with when she needs to have a bowel movement.  After she goes the bathroom it usually resolves.  She called the [place] healthcare nurse that was on call for her who told her she should come to the ED for evaluation.  She has appointment with her doctor in a couple weeks.  She is well-appearing nontoxic.  No significant electrolyte abnormality.  Initial troponin is negative.  EKG  without concerning finding.  No arrhythmia.  Normal sinus rhythm.  Delta  Trop negative.  Discharge home.  7:45 PM:  I have discussed the diagnosis/risks/treatment options with the patient and believe the pt to be eligible for discharge home to follow-up with PCP. We also discussed returning to the ED immediately if new or worsening sx occur. We discussed the sx which are most concerning (e.g., persistent symptoms or exertional symptoms or chest pain or shortness of breath.) that necessitate immediate return. Medications administered to the patient during their visit and any new prescriptions provided to the patient are listed below.  Medications given during this visit Medications  sodium chloride flush (NS) 0.9 % injection 3 mL (has no administration in time range)     The patient appears reasonably screen and/or stabilized for discharge and I doubt any other medical condition or other [facility] requiring further screening, evaluation, or treatment in the ED at this time prior to discharge.   Final Clinical Impression(s) / ED Diagnoses Final diagnoses:  None    Rx / DC Orders ED Discharge Orders    None       Melene Plan, DO 04/04/20 1945

## 2020-06-10 ENCOUNTER — Telehealth: Payer: Self-pay | Admitting: Infectious Diseases

## 2020-06-10 DIAGNOSIS — R911 Solitary pulmonary nodule: Secondary | ICD-10-CM

## 2020-06-10 NOTE — Telephone Encounter (Signed)
-----   Message from [name], MD sent at 01/07/2020  2:57 PM EST ----- Needs repeat CT chest

## 2020-06-10 NOTE — Telephone Encounter (Signed)
Needs repeat CT scan due to prev lung nodules. 11-2019

## 2020-06-14 ENCOUNTER — Telehealth: Payer: Self-pay | Admitting: Infectious Diseases

## 2020-06-15 ENCOUNTER — Other Ambulatory Visit: Payer: Self-pay

## 2020-06-15 ENCOUNTER — Ambulatory Visit
Admission: RE | Admit: 2020-06-15 | Discharge: 2020-06-15 | Disposition: A | Discharge status: Home / Self Care | Payer: Medicare Other | Source: Ambulatory Visit | Attending: Infectious Diseases | Admitting: Infectious Diseases

## 2020-06-15 ENCOUNTER — Other Ambulatory Visit: Payer: Medicare Other

## 2020-06-15 DIAGNOSIS — Z79899 Other long term (current) drug therapy: Secondary | ICD-10-CM

## 2020-06-15 DIAGNOSIS — B20 Human immunodeficiency virus [HIV] disease: Secondary | ICD-10-CM

## 2020-06-15 DIAGNOSIS — R911 Solitary pulmonary nodule: Secondary | ICD-10-CM

## 2020-06-15 DIAGNOSIS — Z113 Encounter for screening for infections with a predominantly sexual mode of transmission: Secondary | ICD-10-CM

## 2020-06-15 NOTE — Telephone Encounter (Signed)
error 

## 2020-06-16 LAB — T-HELPER CELL (CD4) - (RCID CLINIC ONLY)
CD4 % Helper T Cell: 23 % — ABNORMAL LOW (ref 33–65)
CD4 T Cell Abs: 317 /uL — ABNORMAL LOW (ref 400–1790)

## 2020-06-20 LAB — COMPREHENSIVE METABOLIC PANEL
AG Ratio: 0.9 (calc) — ABNORMAL LOW (ref 1.0–2.5)
ALT: 8 U/L (ref 6–29)
AST: 17 U/L (ref 10–35)
Albumin: 3.9 g/dL (ref 3.6–5.1)
Alkaline phosphatase (APISO): 55 U/L (ref 37–153)
BUN/Creatinine Ratio: 14 (calc) (ref 6–22)
BUN: 14 mg/dL (ref 7–25)
CO2: 21 mmol/L (ref 20–32)
Calcium: 9.8 mg/dL (ref 8.6–10.4)
Chloride: 106 mmol/L (ref 98–110)
Creat: 0.99 mg/dL — ABNORMAL HIGH (ref 0.60–0.93)
Globulin: 4.3 g/dL (calc) — ABNORMAL HIGH (ref 1.9–3.7)
Glucose, Bld: 103 mg/dL — ABNORMAL HIGH (ref 65–99)
Potassium: 4 mmol/L (ref 3.5–5.3)
Sodium: 137 mmol/L (ref 135–146)
Total Bilirubin: 0.4 mg/dL (ref 0.2–1.2)
Total Protein: 8.2 g/dL — ABNORMAL HIGH (ref 6.1–8.1)

## 2020-06-20 LAB — CBC
HCT: 30.3 % — ABNORMAL LOW (ref 35.0–45.0)
Hemoglobin: 9.4 g/dL — ABNORMAL LOW (ref 11.7–15.5)
MCH: 24.3 pg — ABNORMAL LOW (ref 27.0–33.0)
MCHC: 31 g/dL — ABNORMAL LOW (ref 32.0–36.0)
MCV: 78.3 fL — ABNORMAL LOW (ref 80.0–100.0)
MPV: 12.3 fL (ref 7.5–12.5)
Platelets: 178 10*3/uL (ref 140–400)
RBC: 3.87 10*6/uL (ref 3.80–5.10)
RDW: 16.2 % — ABNORMAL HIGH (ref 11.0–15.0)
WBC: 3.2 10*3/uL — ABNORMAL LOW (ref 3.8–10.8)

## 2020-06-20 LAB — HIV-1 RNA QUANT-NO REFLEX-BLD
HIV 1 RNA Quant: 98 copies/mL — ABNORMAL HIGH
HIV-1 RNA Quant, Log: 1.99 Log copies/mL — ABNORMAL HIGH

## 2020-06-20 LAB — LIPID PANEL
Cholesterol: 191 mg/dL (ref <200)
HDL: 54 mg/dL (ref >=50)
LDL Cholesterol (Calc): 115 mg/dL (calc) — ABNORMAL HIGH
Non-HDL Cholesterol (Calc): 137 mg/dL (calc) — ABNORMAL HIGH (ref <130)
Total CHOL/HDL Ratio: 3.5 (calc) (ref <5.0)
Triglycerides: 109 mg/dL (ref <150)

## 2020-06-20 LAB — RPR: RPR Ser Ql: NONREACTIVE

## 2020-06-30 ENCOUNTER — Other Ambulatory Visit: Payer: Self-pay

## 2020-06-30 ENCOUNTER — Ambulatory Visit (INDEPENDENT_AMBULATORY_CARE_PROVIDER_SITE_OTHER): Payer: Medicare Other | Admitting: Infectious Diseases

## 2020-06-30 ENCOUNTER — Encounter: Payer: Self-pay | Admitting: Infectious Diseases

## 2020-06-30 VITALS — BP 129/67 | HR 67 | Wt 133.8 lb

## 2020-06-30 DIAGNOSIS — I700 Atherosclerosis of aorta: Secondary | ICD-10-CM | POA: Diagnosis not present

## 2020-06-30 DIAGNOSIS — J449 Chronic obstructive pulmonary disease, unspecified: Secondary | ICD-10-CM | POA: Diagnosis not present

## 2020-06-30 DIAGNOSIS — K5900 Constipation, unspecified: Secondary | ICD-10-CM

## 2020-06-30 DIAGNOSIS — Z113 Encounter for screening for infections with a predominantly sexual mode of transmission: Secondary | ICD-10-CM

## 2020-06-30 DIAGNOSIS — B20 Human immunodeficiency virus [HIV] disease: Secondary | ICD-10-CM

## 2020-06-30 DIAGNOSIS — R911 Solitary pulmonary nodule: Secondary | ICD-10-CM

## 2020-06-30 DIAGNOSIS — Z79899 Other long term (current) drug therapy: Secondary | ICD-10-CM

## 2020-06-30 NOTE — Assessment & Plan Note (Signed)
She continues to have difficulty despite adequate po water and prune/apple juice.  Will have GI see here (esp in light of ED visit 03-2020)

## 2020-06-30 NOTE — Assessment & Plan Note (Signed)
Seen on CT 

## 2020-06-30 NOTE — Assessment & Plan Note (Signed)
Her CT scan did not show this.  Resolved.

## 2020-06-30 NOTE — Assessment & Plan Note (Signed)
Seen on CT asx

## 2020-06-30 NOTE — Assessment & Plan Note (Signed)
Will have her seen in PAP clinic She has gotten COVID vax She continues off ART If her CD4 continues to drop, consider start ART.  rtc in 6 months

## 2020-06-30 NOTE — Progress Notes (Signed)
   Subjective:    Patient ID: Tina Olson, female    DOB: June 27, 1946, 74 y.o.   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 Was started on harvoni in July 2015 for her Hep C (VL 6.5 million) and F0 elastography/ultrasound. She was not able to tolerate this (thinks she took ~ 1/2 of the rx, didn't get last bottle). Her repeat Hep C RNA has been negative (10-04-14) & (06-27-15)  Mammonormal2-2020. Her mammo is pending for this year.  Pap normal6-2019. Colon/Endo 06-2017  Has bene having more GI upset recently. Last 2 weeks.  She is not clear if she has an ulcer. No pain, just bloating. No nausea.  Wonders if related to her PPI.  Continues to take rx for constipation.   Has had COVID vax.   HIV 1 RNA Quant (copies/mL)  Date Value  06/15/2020 98 (H)  12/23/2019 679 (H)  03/03/2019 494 (H)   CD4 T Cell Abs (/uL)  Date Value  06/15/2020 317 (L)  12/23/2019 410  03/03/2019 420     Review of Systems  Constitutional: Negative for appetite change and unexpected weight change.  Gastrointestinal: Positive for constipation. Negative for anal bleeding, blood in stool and diarrhea.  Genitourinary: Negative for difficulty urinating.       Objective:   Physical Exam Vitals reviewed.  Constitutional:      General: She is not in acute distress. HENT:     Mouth/Throat:     Mouth: Mucous membranes are moist.     Pharynx: No oropharyngeal exudate.  Eyes:     Extraocular Movements: Extraocular movements intact.     Pupils: Pupils are equal, round, and reactive to light.  Cardiovascular:     Rate and Rhythm: Normal rate and regular rhythm.  Pulmonary:     Effort: Pulmonary effort is normal.     Breath sounds: Normal breath sounds.  Abdominal:     General: Bowel sounds are normal.     Palpations: Abdomen is soft.  Musculoskeletal:        General: Normal range of motion.     Cervical back:  Normal range of motion.     Right lower leg: No edema.     Left lower leg: No edema.  Neurological:     General: No focal deficit present.     Mental Status: She is alert.  Psychiatric:        Mood and Affect: Mood normal.           Assessment & Plan:

## 2020-07-20 ENCOUNTER — Ambulatory Visit: Payer: Self-pay | Admitting: Surgery

## 2020-07-20 NOTE — H&P (Signed)
Surgical Evaluation  HPI: 74 year old woman with history of aortic atherosclerosis, COPD (asymptomatic), HIV (on no meds since mid-90s and reports good Tcell function) and hepatitis C (partially treated w harvoni but follow up labs negative), hypertension, anemia and acid reflux is referred for right inguinal hernia. CT scan in December 2020 demonstrated bilateral fat-containing inguinal hernias. She has no symptoms from the left, but the right side has begun to increase in size and has begin causing her pain with certain activities. 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 She reports some decreased appetite and recent issues with constipation which is well-managed with juice and over-the-counter stool softeners. No prior abdominal surgeries.   Allergies  Allergen Reactions  . Lisinopril Itching and Cough  . Other Hives and Itching     Seafood crustaceans  "itchy throat"    Past Medical History:  Diagnosis Date  . Acid reflux   . Anemia   . Hepatitis C   . HIV infection (HCC)   . Hypertension     Past Surgical History:  Procedure Laterality Date  . CATARACT EXTRACTION Right 2016  . COLONOSCOPY WITH PROPOFOL N/A 06/11/2017   Procedure: COLONOSCOPY WITH PROPOFOL;  Surgeon: Napoleon Form, MD;  Location: [facility];  Service: Endoscopy;  Laterality: N/A;  PT IS KNOWN HEART STICK. WILL PROBABLY REQUIRE ULTRASOUND OR ANESTHESIA FOR IV STICK.  Marland Kitchen ESOPHAGOGASTRODUODENOSCOPY (EGD) WITH PROPOFOL N/A 06/11/2017   Procedure: ESOPHAGOGASTRODUODENOSCOPY (EGD) WITH PROPOFOL;  Surgeon: Napoleon Form, MD;  Location[facility];  Service: Endoscopy;  Laterality: N/A;    Family History  Problem Relation Age of Onset  . Alzheimer's disease Mother   . Glaucoma Mother   . Hypertension Mother   . Cancer Father        ? type  . Seizures Son     Social History   Socioeconomic History  . Marital status: Widowed     Spouse name: Not on file  . Number of children: Not on file  . Years of education: Not on file  . Highest education level: Not on file  Occupational History  . Not on file  Tobacco Use  . Smoking status: Former Smoker    Types: Cigarettes    Quit date: 12/10/2000    Years since quitting: 19.6  . Smokeless tobacco: Never Used  Vaping Use  . Vaping Use: Never used  Substance and Sexual Activity  . Alcohol use: No    Alcohol/week: 0.0 standard drinks  . Drug use: No    Types: IV    Comment: clean since around 2000  . Sexual activity: Never    Comment: pt. declined condoms  Other Topics Concern  . Not on file  Social History Narrative  . Not on file   Social Determinants of Health   Financial Resource Strain:   . Difficulty of Paying Living Expenses:   Food Insecurity:   . Worried About Programme researcher, broadcasting/film/video in the Last Year:   . Barista in the Last Year:   Transportation Needs:   . Freight forwarder (Medical):   Marland Kitchen Lack of Transportation (Non-Medical):   Physical Activity:   . Days of Exercise per Week:   . Minutes of Exercise per Session:   Stress:   . Feeling of Stress :   Social Connections:   . Frequency of Communication with Friends and Family:   . Frequency of Social  Gatherings with Friends and Family:   . Attends Religious Services:   . Active Member of Clubs or Organizations:   . Attends Banker Meetings:   Marland Kitchen Marital Status:     Current Outpatient Medications on File Prior to Visit  Medication Sig Dispense Refill  . acetaminophen (TYLENOL) 325 MG tablet Take 325 mg by mouth daily as needed for moderate pain or headache.    Marland Kitchen amLODipine (NORVASC) 5 MG tablet Take 5 mg by mouth daily.    Marland Kitchen BIOTIN PO Take 500 mcg by mouth daily.    . calcium carbonate (OSCAL) 1500 (600 Ca) MG TABS tablet Take 600 mg of elemental calcium by mouth daily with breakfast.    . docusate sodium (COLACE) 100 MG capsule Take 100 mg by mouth daily as needed for  mild constipation.    . fluticasone (FLONASE) 50 MCG/ACT nasal spray Place 1 spray into both nostrils daily as needed for allergies or rhinitis.     . hydrocortisone (ANUSOL-HC) 25 MG suppository Place 1 suppository (25 mg total) rectally 2 (two) times daily as needed for hemorrhoids (rectal bleeding). (Patient not taking: Reported on 06/30/2020) 12 suppository 0  . Omega-3 Fatty Acids (FISH OIL) 1200 MG CAPS Take 1,200 mg by mouth daily.    Marland Kitchen omeprazole (PRILOSEC) 40 MG capsule Take 1 capsule (40 mg total) by mouth 2 (two) times daily at 8 am and 10 pm. 60 capsule 11  . polyethylene glycol (MIRALAX / GLYCOLAX) packet Take 17 g by mouth daily as needed for mild constipation.     . potassium chloride (K-DUR) 10 MEQ tablet Take 2 tablets (20 mEq total) by mouth daily. (Patient not taking: Reported on 01/07/2020) 14 tablet 0  . potassium chloride 20 MEQ TBCR Take 40 mEq by mouth once for 1 dose. 2 tablet 0  . prednisoLONE acetate (PRED FORTE) 1 % ophthalmic suspension Place 1 drop into the left eye 4 (four) times daily. (Patient not taking: Reported on 06/30/2020)    . prednisoLONE acetate (PRED FORTE) 1 % ophthalmic suspension Place 1 drop into the left eye 4 times daily. (Patient not taking: Reported on 06/30/2020)    . valACYclovir (VALTREX) 1000 MG tablet Take 1,000 mg by mouth 2 (two) times daily.    . valACYclovir (VALTREX) 1000 MG tablet Take by mouth.     No current facility-administered medications on file prior to visit.    Review of Systems: a complete, 10pt review of systems was completed with pertinent positives and negatives as documented in the HPI  Physical Exam: Vitals Weight: 132.25 lb Height: 60in Body Surface Area: 1.57 m Body Mass Index: 25.83 kg/m  Temp.: 97.37F  Pulse: 72 (Regular)  P.OX: 97% (Room air) BP: 138/70(Sitting, Left Arm, Standard)  She is alert and well-appearing Extraocular motions intact, no scleral icterus Unlabored respirations Abdomen soft  and nontender, nondistended. There is a reducible right inguinal hernia which is mildly tender, no overlying skin changes. The left internal hernia noted on CT is not palpable on exam. No lower extremity edema   CBC Latest Ref Rng & Units 06/15/2020 04/04/2020 12/23/2019  WBC 3.8 - 10.8 Thousand/uL 3.2(L) 3.2(L) 2.8(L)  Hemoglobin 11.7 - 15.5 g/dL 2.6(Z) 10.1(L) 10.6(L)  Hematocrit 35 - 45 % 30.3(L) 34.3(L) 32.9(L)  Platelets 140 - 400 Thousand/uL 178 82(L) 188    CMP Latest Ref Rng & Units 06/15/2020 04/04/2020 12/23/2019  Glucose 65 - 99 mg/dL 124(P) 809(X) 833(A)  BUN 7 - 25 mg/dL 14 10  13  Creatinine 0.60 - 0.93 mg/dL 9.73(Z) 3.29 9.24(Q)  Sodium 135 - 146 mmol/L 137 138 135  Potassium 3.5 - 5.3 mmol/L 4.0 3.5 4.1  Chloride 98 - 110 mmol/L 106 107 105  CO2 20 - 32 mmol/L 21 23 19(L)  Calcium 8.6 - 10.4 mg/dL 9.8 9.8 68.3  Total Protein 6.1 - 8.1 g/dL 8.2(H) - 8.7(H)  Total Bilirubin 0.2 - 1.2 mg/dL 0.4 - 0.6  Alkaline Phos 38 - 126 U/L - - -  AST 10 - 35 U/L 17 - 19  ALT 6 - 29 U/L 8 - 8    Lab Results  Component Value Date   INR 1.05 01/26/2017   INR 1.04 05/17/2014    Imaging: No results found.   A/P: RIGHT INGUINAL HERNIA (K40.90) Story: Reducible, increasingly symptomatic. The left side is neither symptomatic nor appreciable on physical exam and therefore I do not recommend surgery for this at this time. I recommend an open right inguinal repair with mesh. We discussed the relevant anatomy and we discussed the technique of the procedure. Discussed risks of bleeding, infection, pain, scarring, injury to structures in the area including nerves, blood vessels, bowel, bladder, risk of chronic pain, hernia recurrence, risk of seroma or hematoma, urinary retention, and risks of general anesthesia including cardiovascular, pulmonary, and thromboembolic complications. Questions were answered. Patient wishes to proceed with scheduling.    Patient Active Problem List   Diagnosis Date  Noted  . Atherosclerosis of aorta (HCC) 06/30/2020  . COPD (chronic obstructive pulmonary disease) (HCC) 06/30/2020  . Chest pain 01/26/2017  . Microcytic anemia 01/26/2017  . Acute kidney injury (HCC) 01/26/2017  . 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 Constipation 01/26/2017  . Hypertension 10/08/2011  . SHELLFISH ALLERGY 10/20/2009  . Human immunodeficiency virus (HIV) disease Armed forces logistics/support/administrative officer) 04/18/2009  . BREAST MASS, BENIGN 04/18/2009  . HEMATOCHEZIA, HX OF 04/18/2009       Phylliss Blakes, MD [facility], [place]  See AMION to contact appropriate on-call provider

## 2020-08-04 ENCOUNTER — Ambulatory Visit (INDEPENDENT_AMBULATORY_CARE_PROVIDER_SITE_OTHER): Payer: Medicare Other | Admitting: Physician Assistant

## 2020-08-04 ENCOUNTER — Encounter: Payer: Self-pay | Admitting: Physician Assistant

## 2020-08-04 VITALS — BP 120/68 | HR 72 | Ht 60.0 in | Wt 132.0 lb

## 2020-08-04 DIAGNOSIS — K5900 Constipation, unspecified: Secondary | ICD-10-CM

## 2020-08-04 DIAGNOSIS — R140 Abdominal distension (gaseous): Secondary | ICD-10-CM | POA: Diagnosis not present

## 2020-08-04 NOTE — Patient Instructions (Signed)
Make sure to take Miralax 9 grams (1/2 capful) dissolved in at least 8 ounces water/juice once daily on a consistent basis.  Please follow up with our office as needed.  If you are age 72 or older, your body mass index should be between 23-30. Your Body mass index is 25.78 kg/m. If this is out of the aforementioned range listed, please consider follow up with your Primary Care Provider.  If you are age 44 or younger, your body mass index should be between 19-25. Your Body mass index is 25.78 kg/m. If this is out of the aformentioned range listed, please consider follow up with your Primary Care Provider.   Due to recent changes in healthcare laws, you may see the results of your imaging and laboratory studies on MyChart before your provider has had a chance to review them.  We understand that in some cases there may be results that are confusing or concerning to you. Not all laboratory results come back in the same time frame and the provider may be waiting for multiple results in order to interpret others.  Please give [place] 48 hours in order for your provider to thoroughly review all the results before contacting the office for clarification of your results.

## 2020-08-04 NOTE — Progress Notes (Signed)
Chief Complaint: Constipation  HPI:    Tina Olson is a 74 year old African-American female, known to Dr. Lavon Paganini, with past medical history as listed below including hepatitis C, HIV and reflux, who was referred to me by Tina Bouchard, MD for a complaint of constipation.      04/01/2017 patient seen in clinic for slowly progressive microcytic anemia.  That time was set up for EGD and colonoscopy in the hospital as she was a former IV drug user and had poor venous access.    06/11/2017 colonoscopy with nonbleeding internal hemorrhoids and otherwise normal.  Repeat recommended in 10 years.    06/16/2017 EGD with LA grade B reflux esophagitis, benign-appearing esophageal stenosis, nonbleeding grade 1 and small less than 5 mm esophageal varices, gastritis and normal duodenum.  Repeat recommended in 2 years in a hospital setting with PICC line for IV access.    Patient is scheduled to have inguinal hernia repair 09/09/2020.    Today, the patient presents clinic and explains that she typically mixes a dose of MiraLAX in a 6 to 8 ounce glass of prune juice every few nights in order to have a bowel movement the next morning.  In between having a good bowel movement though she gets bloated and gassy and has tried gas pills.  She also sometimes gets nauseous if she is really backed up.  Tells me she tried taking this on a nightly basis but this gave her "stomach upset" and she had to use Pepto-Bismol.    Also describes that she was told her hepatitis C is "cured", apparently her PCP followed levels for a year and she was told she no longer has it.    Denies fever, chills, blood in her stool, weight loss or symptoms that awaken her from sleep.   Past Medical History:  Diagnosis Date  . Acid reflux   . Anemia   . Hepatitis C   . HIV infection (HCC)   . Hypertension     Past Surgical History:  Procedure Laterality Date  . CATARACT EXTRACTION Right 2016  . COLONOSCOPY WITH PROPOFOL N/A 06/11/2017    Procedure: COLONOSCOPY WITH PROPOFOL;  Surgeon: Napoleon Form, MD;  Location: [facility];  Service: Endoscopy;  Laterality: N/A;  PT IS KNOWN HEART STICK. WILL PROBABLY REQUIRE ULTRASOUND OR ANESTHESIA FOR IV STICK.  Marland Kitchen ESOPHAGOGASTRODUODENOSCOPY (EGD) WITH PROPOFOL N/A 06/11/2017   Procedure: ESOPHAGOGASTRODUODENOSCOPY (EGD) WITH PROPOFOL;  Surgeon: Napoleon Form, MD;  Location: [facility];  Service: Endoscopy;  Laterality: N/A;    Current Outpatient Medications  Medication Sig Dispense Refill  . acetaminophen (TYLENOL) 325 MG tablet Take 325 mg by mouth daily as needed for moderate pain or headache.    Marland Kitchen amLODipine (NORVASC) 5 MG tablet Take 5 mg by mouth daily.    Marland Kitchen BIOTIN PO Take 500 mcg by mouth daily.    Marland Kitchen docusate sodium (COLACE) 100 MG capsule Take 100 mg by mouth daily as needed for mild constipation.    . fluticasone (FLONASE) 50 MCG/ACT nasal spray Place 1 spray into both nostrils daily as needed for allergies or rhinitis.     . Omega-3 Fatty Acids (FISH OIL) 1200 MG CAPS Take 1,200 mg by mouth daily.    Marland Kitchen omeprazole (PRILOSEC) 40 MG capsule Take 1 capsule (40 mg total) by mouth 2 (two) times daily at 8 am and 10 pm. 60 capsule 11  . polyethylene glycol (MIRALAX / GLYCOLAX) packet Take 17 g by mouth daily as needed for  mild constipation.     . valACYclovir (VALTREX) 1000 MG tablet Take 1,000 mg by mouth 2 (two) times daily.    Marland Kitchen zinc gluconate 50 MG tablet Take 50 mg by mouth daily.    Marland Kitchen azaTHIOprine (IMURAN) 50 MG tablet Take 25 mg by mouth daily.     No current facility-administered medications for this visit.    Allergies as of 08/04/2020 - Review Complete 06/30/2020  Allergen Reaction Noted  . Lisinopril Itching and Cough 02/25/2012  . Other Hives and Itching 07/07/2012    Family History  Problem Relation Age of Onset  . Alzheimer's disease Mother   . Glaucoma Mother   . Hypertension Mother   . Cancer Father        ? type  . Seizures Son     Social  History   Socioeconomic History  . Marital status: Widowed    Spouse name: Not on file  . Number of children: Not on file  . Years of education: Not on file  . Highest education level: Not on file  Occupational History  . Not on file  Tobacco Use  . Smoking status: Former Smoker    Types: Cigarettes    Quit date: 12/10/2000    Years since quitting: 19.6  . Smokeless tobacco: Never Used  Vaping Use  . Vaping Use: Never used  Substance and Sexual Activity  . Alcohol use: No    Alcohol/week: 0.0 standard drinks  . Drug use: No    Types: IV    Comment: clean since around 2000  . Sexual activity: Never    Comment: pt. declined condoms  Other Topics Concern  . Not on file  Social History Narrative  . Not on file   Social Determinants of Health   Financial Resource Strain:   . Difficulty of Paying Living Expenses: Not on file  Food Insecurity:   . Worried About Programme researcher, broadcasting/film/video in the Last Year: Not on file  . Ran Out of Food in the Last Year: Not on file  Transportation Needs:   . Lack of Transportation (Medical): Not on file  . Lack of Transportation (Non-Medical): Not on file  Physical Activity:   . Days of Exercise per Week: Not on file  . Minutes of Exercise per Session: Not on file  Stress:   . Feeling of Stress : Not on file  Social Connections:   . Frequency of Communication with Friends and Family: Not on file  . Frequency of Social Gatherings with Friends and Family: Not on file  . Attends Religious Services: Not on file  . Active Member of Clubs or Organizations: Not on file  . Attends Banker Meetings: Not on file  . Marital Status: Not on file  Intimate Partner Violence:   . Fear of Current or Ex-Partner: Not on file  . Emotionally Abused: Not on file  . Physically Abused: Not on file  . Sexually Abused: Not on file    Review of Systems:    Constitutional: No weight loss, fever or chills Skin: No rash  Cardiovascular: No chest  pain Respiratory: No SOB Gastrointestinal: See HPI and otherwise negative Genitourinary: No dysuria  Neurological: No headache, dizziness or syncope Musculoskeletal: No new muscle or joint pain Hematologic: No bleeding  Psychiatric: No history of depression or anxiety   Physical Exam:  Vital signs: BP 120/68   Pulse 72   Ht 5' (1.524 m)   Wt 132 lb (59.9 kg)  BMI 25.78 kg/m    Constitutional:   Pleasant AA female appears to be in NAD, Well developed, Well nourished, alert and cooperative Head:  Normocephalic and atraumatic. Eyes:   PEERL, EOMI. No icterus. Conjunctiva pink. Ears:  Normal auditory acuity. Neck:  Supple Throat: Oral cavity and pharynx without inflammation, swelling or lesion.  Respiratory: Respirations even and unlabored. Lungs clear to auscultation bilaterally.   No wheezes, crackles, or rhonchi.  Cardiovascular: Normal S1, S2. No MRG. Regular rate and rhythm. No peripheral edema, cyanosis or pallor.  Gastrointestinal:  Soft, nondistended, nontender. No rebound or guarding. Normal bowel sounds. No appreciable masses or hepatomegaly. Rectal:  Not performed.  Msk:  Symmetrical without gross deformities. Without edema, no deformity or joint abnormality.  Neurologic:  Alert and  oriented x4;  grossly normal neurologically.  Skin:   Dry and intact without significant lesions or rashes. Psychiatric: Demonstrates good judgement and reason without abnormal affect or behaviors.  RELEVANT LABS AND IMAGING: CBC    Component Value Date/Time   WBC 3.2 (L) 06/15/2020 1001   RBC 3.87 06/15/2020 1001   HGB 9.4 (L) 06/15/2020 1001   HCT 30.3 (L) 06/15/2020 1001   PLT 178 06/15/2020 1001   MCV 78.3 (L) 06/15/2020 1001   MCH 24.3 (L) 06/15/2020 1001   MCHC 31.0 (L) 06/15/2020 1001   RDW 16.2 (H) 06/15/2020 1001   LYMPHSABS 1,817 03/03/2019 0935   MONOABS 0.3 10/04/2014 1428   EOSABS 19 03/03/2019 0935   BASOSABS 19 03/03/2019 0935    CMP     Component Value  Date/Time   NA 137 06/15/2020 1001   K 4.0 06/15/2020 1001   CL 106 06/15/2020 1001   CO2 21 06/15/2020 1001   GLUCOSE 103 (H) 06/15/2020 1001   BUN 14 06/15/2020 1001   CREATININE 0.99 (H) 06/15/2020 1001   CALCIUM 9.8 06/15/2020 1001   PROT 8.2 (H) 06/15/2020 1001   ALBUMIN 3.8 10/08/2017 1344   AST 17 06/15/2020 1001   ALT 8 06/15/2020 1001   ALKPHOS 55 10/08/2017 1344   BILITOT 0.4 06/15/2020 1001   GFRNONAA >60 04/04/2020 1146   GFRNONAA 53 (L) 03/03/2019 0935   GFRAA >60 04/04/2020 1146   GFRAA 61 03/03/2019 0935    Assessment: 1.  Constipation: Better when she uses MiraLAX and prune juice 2.  Esophageal varices: Seen at time of last EGD in 2018, repeat was recommended in 2 years  Plan: 1.  Discussed with patient that I would recommend that she try using MiraLAX on a daily basis.  Discussed using just a quarter or a half of a dose on a nightly basis to maintain regular bowel movements instead of waiting until she was severely constipated with abdominal distention, gas and nausea to finally take something.  She verbalized understanding and will try this. 2.  Discussed finding of esophageal varices at last EGD.  Patient is adamant that she is cured of hepatitis C and has no liver problems.  Tried to educate and explained.  She does not want to have an EGD as this would require a PICC line because she has very bad veins.  Patient declines procedure at this time. 3.  Patient to follow in clinic in 6 to 8 weeks or sooner if necessary.  Hyacinth Meeker, PA-C  Gastroenterology 08/04/2020, 10:20 AM  Cc: Tina Bouchard, MD

## 2020-08-05 NOTE — Progress Notes (Signed)
Reviewed and agree with documentation and assessment and plan. K. Veena Axil Copeman , MD   

## 2020-09-06 ENCOUNTER — Other Ambulatory Visit (HOSPITAL_COMMUNITY)
Admission: RE | Admit: 2020-09-06 | Discharge: 2020-09-06 | Disposition: A | Discharge status: Home / Self Care | Payer: Medicare Other | Source: Ambulatory Visit | Attending: Surgery | Admitting: Surgery

## 2020-09-06 ENCOUNTER — Encounter (HOSPITAL_BASED_OUTPATIENT_CLINIC_OR_DEPARTMENT_OTHER): Payer: Self-pay | Admitting: Surgery

## 2020-09-06 ENCOUNTER — Other Ambulatory Visit: Payer: Self-pay

## 2020-09-06 DIAGNOSIS — Z01812 Encounter for preprocedural laboratory examination: Secondary | ICD-10-CM | POA: Insufficient documentation

## 2020-09-06 DIAGNOSIS — Z20822 Contact with and (suspected) exposure to covid-19: Secondary | ICD-10-CM | POA: Diagnosis not present

## 2020-09-06 LAB — SARS CORONAVIRUS 2 (TAT 6-24 HRS): SARS Coronavirus 2: NEGATIVE

## 2020-09-06 NOTE — Progress Notes (Signed)
Spoke w/ via phone for pre-op interview---pt Lab needs dos---- I stat 8               Lab results------ekg 04-05-2020 epic COVID test ------09-06-2020 Arrive at -------900 am 09-09-2020 NPO after MN NO Solid Food.  Clear liquids from MN until---800 am then npo Medications to take morning of surgery -----imuran, valtrex, amlodipine, omeprazole Diabetic medication -----n/a Patient Special Instructions -----hibiclens shower hs and am Pre-Op special Istructions -----none Patient verbalized understanding of instructions that were given at this phone interview. Patient denies shortness of breath, chest pain, fever, cough at this phone interview.

## 2020-09-08 NOTE — H&P (Signed)
Surgical Evaluation  HPI: 74 year old woman with history of aortic atherosclerosis, COPD (asymptomatic), HIV (on no meds since mid-90s and reports good Tcell function) and hepatitis C (partially treated w harvoni but follow up labs negative), hypertension, anemia and acid reflux is referred for right inguinal hernia. CT scan in December 2020 demonstrated bilateral fat-containing inguinal hernias. She has no symptoms from the left, but the right side has begun to increase in size and has begin causing her pain with certain activities. 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 She reports some decreased appetite and recent issues with constipation which is well-managed with juice and over-the-counter stool softeners. No prior abdominal surgeries.        Allergies  Allergen Reactions  . Lisinopril Itching and Cough  . Other Hives and Itching     Seafood crustaceans  "itchy throat"        Past Medical History:  Diagnosis Date  . Acid reflux   . Anemia   . Hepatitis C   . HIV infection (HCC)   . Hypertension          Past Surgical History:  Procedure Laterality Date  . CATARACT EXTRACTION Right 2016  . COLONOSCOPY WITH PROPOFOL N/A 06/11/2017   Procedure: COLONOSCOPY WITH PROPOFOL;  Surgeon: Napoleon Form, MD;  Location: [facility];  Service: Endoscopy;  Laterality: N/A;  PT IS KNOWN HEART STICK. WILL PROBABLY REQUIRE ULTRASOUND OR ANESTHESIA FOR IV STICK.  Marland Kitchen ESOPHAGOGASTRODUODENOSCOPY (EGD) WITH PROPOFOL N/A 06/11/2017   Procedure: ESOPHAGOGASTRODUODENOSCOPY (EGD) WITH PROPOFOL;  Surgeon: Napoleon Form, MD;  Location: [facility];  Service: Endoscopy;  Laterality: N/A;         Family History  Problem Relation Age of Onset  . Alzheimer's disease Mother   . Glaucoma Mother   . Hypertension Mother   . Cancer Father        ? type  . Seizures Son     Social History         Socioeconomic History  . Marital status: Widowed    Spouse name: Not on file  . Number of children: Not on file  . Years of education: Not on file  . Highest education level: Not on file  Occupational History  . Not on file  Tobacco Use  . Smoking status: Former Smoker    Types: Cigarettes    Quit date: 12/10/2000    Years since quitting: 19.6  . Smokeless tobacco: Never Used  Vaping Use  . Vaping Use: Never used  Substance and Sexual Activity  . Alcohol use: No    Alcohol/week: 0.0 standard drinks  . Drug use: No    Types: IV    Comment: clean since around 2000  . Sexual activity: Never    Comment: pt. declined condoms  Other Topics Concern  . Not on file  Social History Narrative  . Not on file   Social Determinants of Health      Financial Resource Strain:   . Difficulty of Paying Living Expenses:   Food Insecurity:   . Worried About Programme researcher, broadcasting/film/video in the Last Year:   . Barista in the Last Year:   Transportation Needs:   . Freight forwarder (Medical):   Marland Kitchen Lack of Transportation (Non-Medical):   Physical Activity:   . Days of Exercise per Week:   . Minutes of Exercise per Session:   Stress:   .  Feeling of Stress :   Social Connections:   . Frequency of Communication with Friends and Family:   . Frequency of Social Gatherings with Friends and Family:   . Attends Religious Services:   . Active Member of Clubs or Organizations:   . Attends Banker Meetings:   Marland Kitchen Marital Status:           Current Outpatient Medications on File Prior to Visit  Medication Sig Dispense Refill  . acetaminophen (TYLENOL) 325 MG tablet Take 325 mg by mouth daily as needed for moderate pain or headache.    Marland Kitchen amLODipine (NORVASC) 5 MG tablet Take 5 mg by mouth daily.    Marland Kitchen BIOTIN PO Take 500 mcg by mouth daily.    . calcium carbonate (OSCAL) 1500 (600 Ca) MG TABS tablet Take 600 mg of elemental calcium by mouth daily with  breakfast.    . docusate sodium (COLACE) 100 MG capsule Take 100 mg by mouth daily as needed for mild constipation.    . fluticasone (FLONASE) 50 MCG/ACT nasal spray Place 1 spray into both nostrils daily as needed for allergies or rhinitis.     . hydrocortisone (ANUSOL-HC) 25 MG suppository Place 1 suppository (25 mg total) rectally 2 (two) times daily as needed for hemorrhoids (rectal bleeding). (Patient not taking: Reported on 06/30/2020) 12 suppository 0  . Omega-3 Fatty Acids (FISH OIL) 1200 MG CAPS Take 1,200 mg by mouth daily.    Marland Kitchen omeprazole (PRILOSEC) 40 MG capsule Take 1 capsule (40 mg total) by mouth 2 (two) times daily at 8 am and 10 pm. 60 capsule 11  . polyethylene glycol (MIRALAX / GLYCOLAX) packet Take 17 g by mouth daily as needed for mild constipation.     . potassium chloride (K-DUR) 10 MEQ tablet Take 2 tablets (20 mEq total) by mouth daily. (Patient not taking: Reported on 01/07/2020) 14 tablet 0  . potassium chloride 20 MEQ TBCR Take 40 mEq by mouth once for 1 dose. 2 tablet 0  . prednisoLONE acetate (PRED FORTE) 1 % ophthalmic suspension Place 1 drop into the left eye 4 (four) times daily. (Patient not taking: Reported on 06/30/2020)    . prednisoLONE acetate (PRED FORTE) 1 % ophthalmic suspension Place 1 drop into the left eye 4 times daily. (Patient not taking: Reported on 06/30/2020)    . valACYclovir (VALTREX) 1000 MG tablet Take 1,000 mg by mouth 2 (two) times daily.    . valACYclovir (VALTREX) 1000 MG tablet Take by mouth.     No current facility-administered medications on file prior to visit.    Review of Systems: a complete, 10pt review of systems was completed with pertinent positives and negatives as documented in the HPI  Physical Exam: Vitals Weight: 132.25 lb Height: 60in Body Surface Area: 1.57 m Body Mass Index: 25.83 kg/m  Temp.: 97.30F  Pulse: 72 (Regular)  P.OX: 97% (Room air) BP: 138/70(Sitting, Left Arm,  Standard)  She is alert and well-appearing Extraocular motions intact, no scleral icterus Unlabored respirations Abdomen soft and nontender, nondistended. There is a reducible right inguinal hernia which is mildly tender, no overlying skin changes. The left internal hernia noted on CT is not palpable on exam. No lower extremity edema   CBC Latest Ref Rng & Units 06/15/2020 04/04/2020 12/23/2019  WBC 3.8 - 10.8 Thousand/uL 3.2(L) 3.2(L) 2.8(L)  Hemoglobin 11.7 - 15.5 g/dL 1.6(X) 10.1(L) 10.6(L)  Hematocrit 35 - 45 % 30.3(L) 34.3(L) 32.9(L)  Platelets 140 - 400 Thousand/uL 178 82(L)  188    CMP Latest Ref Rng & Units 06/15/2020 04/04/2020 12/23/2019  Glucose 65 - 99 mg/dL 413(K) 440(N) 027(O)  BUN 7 - 25 mg/dL 14 10 13   Creatinine 0.60 - 0.93 mg/dL ) 5.36(U 4.40)  Sodium 135 - 146 mmol/L 137 138 135  Potassium 3.5 - 5.3 mmol/L 4.0 3.5 4.1  Chloride 98 - 110 mmol/L 106 107 105  CO2 20 - 32 mmol/L 21 23 19(L)  Calcium 8.6 - 10.4 mg/dL 9.8 9.8 3.47(Q  Total Protein 6.1 - 8.1 g/dL 8.2(H) - 8.7(H)  Total Bilirubin 0.2 - 1.2 mg/dL 0.4 - 0.6  Alkaline Phos 38 - 126 U/L - - -  AST 10 - 35 U/L 17 - 19  ALT 6 - 29 U/L 8 - 8    Recent Labs       Lab Results  Component Value Date   INR 1.05 01/26/2017   INR 1.04 05/17/2014      Imaging: Imaging Results (Last 48 hours)  No results found.     A/P: RIGHT INGUINAL HERNIA (K40.90) Story: Reducible, increasingly symptomatic. The left side is neither symptomatic nor appreciable on physical exam and therefore I do not recommend surgery for this at this time. I recommend an open right inguinal repair with mesh. We discussed the relevant anatomy and we discussed the technique of the procedure. Discussed risks of bleeding, infection, pain, scarring, injury to structures in the area including nerves, blood vessels, bowel, bladder, risk of chronic pain, hernia recurrence, risk of seroma or hematoma, urinary retention, and risks of  general anesthesia including cardiovascular, pulmonary, and thromboembolic complications. Questions were answered. Patient wishes to proceed with scheduling.        Patient Active Problem List   Diagnosis Date Noted  . Atherosclerosis of aorta (HCC) 06/30/2020  . COPD (chronic obstructive pulmonary disease) (HCC) 06/30/2020  . Chest pain 01/26/2017  . Microcytic anemia 01/26/2017  . Acute kidney injury (HCC) 01/26/2017  . 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 Constipation 01/26/2017  . Hypertension 10/08/2011  . SHELLFISH ALLERGY 10/20/2009  . Human immunodeficiency virus (HIV) disease 13/10/2009) 04/18/2009  . BREAST MASS, BENIGN 04/18/2009  . HEMATOCHEZIA, HX OF 04/18/2009       06/18/2009, MD [facility] Surgery, [facility]

## 2020-09-09 ENCOUNTER — Other Ambulatory Visit: Payer: Self-pay

## 2020-09-09 ENCOUNTER — Ambulatory Visit (HOSPITAL_BASED_OUTPATIENT_CLINIC_OR_DEPARTMENT_OTHER): Payer: Medicare Other | Admitting: Certified Registered Nurse Anesthetist

## 2020-09-09 ENCOUNTER — Encounter (HOSPITAL_BASED_OUTPATIENT_CLINIC_OR_DEPARTMENT_OTHER): Payer: Self-pay | Admitting: Surgery

## 2020-09-09 ENCOUNTER — Ambulatory Visit (HOSPITAL_COMMUNITY)
Admission: RE | Admit: 2020-09-09 | Discharge: 2020-09-09 | Disposition: A | Discharge status: Home / Self Care | Payer: Medicare Other | Attending: Surgery | Admitting: Surgery

## 2020-09-09 ENCOUNTER — Encounter (HOSPITAL_BASED_OUTPATIENT_CLINIC_OR_DEPARTMENT_OTHER)
Admission: RE | Disposition: A | Discharge status: Home / Self Care | Payer: Self-pay | Source: Home / Self Care | Attending: Surgery

## 2020-09-09 DIAGNOSIS — K219 Gastro-esophageal reflux disease without esophagitis: Secondary | ICD-10-CM | POA: Diagnosis not present

## 2020-09-09 DIAGNOSIS — Z538 Procedure and treatment not carried out for other reasons: Secondary | ICD-10-CM | POA: Insufficient documentation

## 2020-09-09 DIAGNOSIS — Z8249 Family history of ischemic heart disease and other diseases of the circulatory system: Secondary | ICD-10-CM | POA: Insufficient documentation

## 2020-09-09 DIAGNOSIS — I10 Essential (primary) hypertension: Secondary | ICD-10-CM | POA: Diagnosis not present

## 2020-09-09 DIAGNOSIS — Z87891 Personal history of nicotine dependence: Secondary | ICD-10-CM | POA: Insufficient documentation

## 2020-09-09 DIAGNOSIS — J449 Chronic obstructive pulmonary disease, unspecified: Secondary | ICD-10-CM | POA: Diagnosis not present

## 2020-09-09 DIAGNOSIS — Z888 Allergy status to other drugs, medicaments and biological substances status: Secondary | ICD-10-CM | POA: Diagnosis not present

## 2020-09-09 DIAGNOSIS — B1920 Unspecified viral hepatitis C without hepatic coma: Secondary | ICD-10-CM | POA: Insufficient documentation

## 2020-09-09 DIAGNOSIS — D649 Anemia, unspecified: Secondary | ICD-10-CM | POA: Diagnosis not present

## 2020-09-09 DIAGNOSIS — N179 Acute kidney failure, unspecified: Secondary | ICD-10-CM | POA: Diagnosis not present

## 2020-09-09 DIAGNOSIS — Z79899 Other long term (current) drug therapy: Secondary | ICD-10-CM | POA: Diagnosis not present

## 2020-09-09 DIAGNOSIS — Z21 Asymptomatic human immunodeficiency virus [HIV] infection status: Secondary | ICD-10-CM | POA: Insufficient documentation

## 2020-09-09 DIAGNOSIS — K4090 Unilateral inguinal hernia, without obstruction or gangrene, not specified as recurrent: Secondary | ICD-10-CM | POA: Diagnosis present

## 2020-09-09 HISTORY — DX: Unilateral inguinal hernia, without obstruction or gangrene, not specified as recurrent: K40.90

## 2020-09-09 HISTORY — DX: Other specified health status: Z78.9

## 2020-09-09 HISTORY — DX: Other hemorrhoids: K64.8

## 2020-09-09 SURGERY — CANCELLED PROCEDURE
Anesthesia: General | Laterality: Right

## 2020-09-09 MED ORDER — FENTANYL CITRATE (PF) 100 MCG/2ML IJ SOLN
INTRAMUSCULAR | Status: AC
Start: 2020-09-09 — End: ?
  Filled 2020-09-09: qty 2

## 2020-09-09 MED ORDER — OXYCODONE HCL 5 MG PO TABS: 5.0000 mg | ORAL_TABLET | Freq: Three times a day (TID) | ORAL | 0 refills | Status: DC | PRN

## 2020-09-09 MED ORDER — CEFAZOLIN SODIUM-DEXTROSE 2-4 GM/100ML-% IV SOLN
INTRAVENOUS | Status: AC
  Filled 2020-09-09: qty 100

## 2020-09-09 MED ORDER — CEFAZOLIN SODIUM-DEXTROSE 2-4 GM/100ML-% IV SOLN: 2.0000 g | INTRAVENOUS | Status: DC

## 2020-09-09 MED ORDER — ACETAMINOPHEN 500 MG PO TABS
1000.0000 mg | ORAL_TABLET | ORAL | Status: AC
  Administered 2020-09-09: 1000 mg via ORAL

## 2020-09-09 MED ORDER — LACTATED RINGERS IV SOLN: INTRAVENOUS | Status: DC

## 2020-09-09 MED ORDER — ACETAMINOPHEN 500 MG PO TABS
ORAL_TABLET | ORAL | Status: AC
  Filled 2020-09-09: qty 2

## 2020-09-09 MED ORDER — BUPIVACAINE LIPOSOME 1.3 % IJ SUSP: 20.0000 mL | Freq: Once | INTRAMUSCULAR | Status: DC

## 2020-09-09 MED ORDER — GABAPENTIN 300 MG PO CAPS
ORAL_CAPSULE | ORAL | Status: AC
  Filled 2020-09-09: qty 1

## 2020-09-09 MED ORDER — CHLORHEXIDINE GLUCONATE 4 % EX LIQD: 60.0000 mL | Freq: Once | CUTANEOUS | Status: DC

## 2020-09-09 MED ORDER — GABAPENTIN 300 MG PO CAPS
300.0000 mg | ORAL_CAPSULE | ORAL | Status: AC
  Administered 2020-09-09: 300 mg via ORAL

## 2020-09-09 SURGICAL SUPPLY — 30 items
BENZOIN TINCTURE PRP APPL 2/3 (GAUZE/BANDAGES/DRESSINGS) IMPLANT
BLADE SURG 15 STRL LF DISP TIS (BLADE) IMPLANT
BLADE SURG 15 STRL SS (BLADE)
CHLORAPREP W/TINT 26 (MISCELLANEOUS) IMPLANT
CLOSURE WOUND 1/2 X4 (GAUZE/BANDAGES/DRESSINGS)
COVER SURGICAL LIGHT HANDLE (MISCELLANEOUS) IMPLANT
COVER WAND RF STERILE (DRAPES) IMPLANT
DECANTER SPIKE VIAL GLASS SM (MISCELLANEOUS) IMPLANT
DRAPE LAPAROSCOPIC ABDOMINAL (DRAPES) IMPLANT
GAUZE SPONGE 4X4 12PLY STRL (GAUZE/BANDAGES/DRESSINGS) IMPLANT
GLOVE BIO SURGEON STRL SZ 6 (GLOVE) IMPLANT
GLOVE INDICATOR 6.5 STRL GRN (GLOVE) IMPLANT
GOWN STRL REUS W/TWL LRG LVL3 (GOWN DISPOSABLE) IMPLANT
GOWN STRL REUS W/TWL XL LVL3 (GOWN DISPOSABLE) IMPLANT
NEEDLE HYPO 22GX1.5 SAFETY (NEEDLE) IMPLANT
PACK BASIN DAY SURGERY FS (CUSTOM PROCEDURE TRAY) IMPLANT
PENCIL SMOKE EVACUATOR (MISCELLANEOUS) IMPLANT
SPONGE LAP 4X18 RFD (DISPOSABLE) IMPLANT
STRIP CLOSURE SKIN 1/2X4 (GAUZE/BANDAGES/DRESSINGS) IMPLANT
SUT ETHIBOND 0 MO6 C/R (SUTURE) IMPLANT
SUT MNCRL AB 4-0 PS2 18 (SUTURE) IMPLANT
SUT PDS AB 0 CT1 36 (SUTURE) IMPLANT
SUT SILK 3 0 (SUTURE)
SUT SILK 3-0 18XBRD TIE 12 (SUTURE) IMPLANT
SUT VIC AB 3-0 SH 27 (SUTURE)
SUT VIC AB 3-0 SH 27XBRD (SUTURE) IMPLANT
SUT VICRYL 3 0 BR 18  UND (SUTURE)
SUT VICRYL 3 0 BR 18 UND (SUTURE) IMPLANT
SYR CONTROL 10ML LL (SYRINGE) IMPLANT
TOWEL OR 17X26 10 PK STRL BLUE (TOWEL DISPOSABLE) IMPLANT

## 2020-09-09 NOTE — Op Note (Signed)
Case was unable to proceed due to no IV access despite numerous attempts with ultrasound by CRNA and attending anesthesiologist- will need to reschedule with coordination with IR for access prior to the case.

## 2020-09-09 NOTE — Transfer of Care (Signed)
Immediate Anesthesia Transfer of Care Note  Patient: Tina Olson  Procedure(s) Performed: CANCELLED PROCEDURE  Patient Location: PACU  Anesthesia Type:case cancelled  Level of Consciousness: awake, alert  and oriented  Airway & Oxygen Therapy: Patient Spontanous Breathing  Post-op Assessment: Report given to RN and Post -op Vital signs reviewed and stable  Post vital signs: Reviewed and stable  Last Vitals:  Vitals Value Taken Time  BP    Temp    Pulse    Resp    SpO2      Last Pain:  Vitals:   09/09/20 0929  TempSrc: Oral  PainSc: 0-No pain      Patients Stated Pain Goal: 5 (09/09/20 0929)  Complications: No complications documented.

## 2020-09-09 NOTE — Anesthesia Preprocedure Evaluation (Addendum)
Anesthesia Evaluation  Patient identified by MRN, date of birth, ID band Patient awake    Reviewed: Allergy & Precautions, NPO status , Patient's Chart, lab work & pertinent test results  Airway Mallampati: II  TM Distance: >3 FB Neck ROM: Full    Dental no notable dental hx.    Pulmonary neg pulmonary ROS, former smoker,    Pulmonary exam normal breath sounds clear to auscultation       Cardiovascular hypertension, Pt. on medications Normal cardiovascular exam Rhythm:Regular Rate:Normal     Neuro/Psych negative neurological ROS  negative psych ROS   GI/Hepatic GERD  ,(+)     substance abuse  IV drug use, Hepatitis -  Endo/Other  negative endocrine ROS  Renal/GU negative Renal ROS  negative genitourinary   Musculoskeletal negative musculoskeletal ROS (+)   Abdominal   Peds negative pediatric ROS (+)  Hematology  (+) HIV,   Anesthesia Other Findings   Reproductive/Obstetrics negative OB ROS                            Anesthesia Physical Anesthesia Plan  ASA: III  Anesthesia Plan: General   Post-op Pain Management:    Induction: Intravenous  PONV Risk Score and Plan: 3 and Ondansetron, Dexamethasone and Treatment may vary due to age or medical condition  Airway Management Planned: LMA  Additional Equipment:   Intra-op Plan:   Post-operative Plan: Extubation in OR  Informed Consent: I have reviewed the patients History and Physical, chart, labs and discussed the procedure including the risks, benefits and alternatives for the proposed anesthesia with the patient or authorized representative who has indicated his/her understanding and acceptance.     Dental advisory given  Plan Discussed with: CRNA and Surgeon  Anesthesia Plan Comments: (Poor IV access. Case cancelled. Will go to IR for PICC either today or when able to be rescheduled)       Anesthesia Quick  Evaluation

## 2020-09-09 NOTE — Interval H&P Note (Signed)
History and Physical Interval Note:  09/09/2020 9:04 AM  Tina Olson  has presented today for surgery, with the diagnosis of right inguinal hernia.  The various methods of treatment have been discussed with the patient and family. After consideration of risks, benefits and other options for treatment, the patient has consented to  Procedure(s): OPEN RIGHT INGUINAL HERNIA REPAIR (Right) as a surgical intervention.  The patient's history has been reviewed, patient examined, no change in status, stable for surgery.  I have reviewed the patient's chart and labs.  Questions were answered to the patient's satisfaction.     Carita Sollars Lollie Sails

## 2020-11-24 ENCOUNTER — Other Ambulatory Visit: Payer: Self-pay

## 2020-11-24 ENCOUNTER — Ambulatory Visit (INDEPENDENT_AMBULATORY_CARE_PROVIDER_SITE_OTHER): Payer: Medicare Other | Admitting: Infectious Diseases

## 2020-11-24 ENCOUNTER — Other Ambulatory Visit (HOSPITAL_COMMUNITY)
Admission: RE | Admit: 2020-11-24 | Discharge: 2020-11-24 | Disposition: A | Discharge status: Home / Self Care | Payer: Medicare Other | Source: Ambulatory Visit | Attending: Infectious Diseases | Admitting: Infectious Diseases

## 2020-11-24 ENCOUNTER — Encounter: Payer: Self-pay | Admitting: Infectious Diseases

## 2020-11-24 ENCOUNTER — Other Ambulatory Visit: Payer: Medicare Other

## 2020-11-24 DIAGNOSIS — Z1151 Encounter for screening for human papillomavirus (HPV): Secondary | ICD-10-CM | POA: Insufficient documentation

## 2020-11-24 DIAGNOSIS — Z124 Encounter for screening for malignant neoplasm of cervix: Secondary | ICD-10-CM

## 2020-11-24 DIAGNOSIS — Z113 Encounter for screening for infections with a predominantly sexual mode of transmission: Secondary | ICD-10-CM | POA: Insufficient documentation

## 2020-11-24 DIAGNOSIS — Z79899 Other long term (current) drug therapy: Secondary | ICD-10-CM

## 2020-11-24 DIAGNOSIS — B20 Human immunodeficiency virus [HIV] disease: Secondary | ICD-10-CM | POA: Diagnosis not present

## 2020-11-24 NOTE — Assessment & Plan Note (Addendum)
Not on medications with historically elite controller status. Follows with Dr. Ninetta Lights - continue current plan.  Flu shot today. She has had her COVID booster.

## 2020-11-24 NOTE — Assessment & Plan Note (Signed)
Normal pelvic exam. Will send cervical discharge for assessment - suspect BV. Discussed treatment. She does have some discharge when she wipes following bowel movements but no other significant symptoms.   Cervical brushing collected for cytology and HPV.  Discussed recommended screening interval for women living with HIV disease meant to be lifelong and at an interval of Q1-3 years pending results. Acceptable to space out Q3y with 3 consecutively normal exams. Further recommendations for Tina Olson to follow today's results.  Results will be communicated to the patient via telephone call.

## 2020-11-24 NOTE — Progress Notes (Addendum)
      Subjective:    Tina Olson is a 74 y.o. female here for cervical cancer screening.   Review of Systems: Current GYN complaints or concerns: none.   Does notice some discharge when she has a BM sometimes.  Patient denies any abdominal/pelvic pain, problems with bowel movements, urination, vaginal discharge or intercourse.    Past Medical History:  Diagnosis Date  . Acid reflux   . Anemia 2017  . Difficult intravenous access   . Hepatitis C 07/2014   took harvani tx   . History of blood transfusion 2017   2 units  . HIV infection (HCC) since 1993  . Hypertension   . Internal hemorrhoid   . Right inguinal hernia     Gynecologic History: No obstetric history on file.  No LMP recorded. Patient is postmenopausal. Contraception: post menopausal status Last Pap: 2017. Results were: normal Anal Intercourse: no Last Mammogram: 01/2019. Results were: normal  Objective:  Physical Exam  Constitutional: Well developed, well nourished, no acute distress. She is alert and oriented x3.  Pelvic: External genitalia is normal in appearance. The vagina is normal in appearance. The cervix is bulbous and easily visualized. No CMT, normal expected cervical mucus present. Psych: She has a normal mood and affect.      Assessment & Plan:    Problem List Items Addressed This Visit      Unprioritized   Screening for cervical cancer    Normal pelvic exam. Will send cervical discharge for assessment - suspect BV. Discussed treatment. She does have some discharge when she wipes following bowel movements but no other significant symptoms.   Cervical brushing collected for cytology and HPV.  Discussed recommended screening interval for women living with HIV disease meant to be lifelong and at an interval of Q1-3 years pending results. Acceptable to space out Q3y with 3 consecutively normal exams. Further recommendations for [name] to follow today's results.  Results  will be communicated to the patient via telephone call.        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 Follows with Dr. Ninetta Lights - continue current plan.  Flu shot today. She has had her COVID booster.        Other Visit Diagnoses    Routine screening for STI (sexually transmitted infection)       Relevant Orders   Urine cytology ancillary only      Rexene Alberts, MSN, NP-C [facility] Office: [phone] Pager: [phone]  11/24/20 10:41 AM

## 2020-11-25 LAB — URINE CYTOLOGY ANCILLARY ONLY
Bacterial Vaginitis-Urine: NEGATIVE
Chlamydia: NEGATIVE
Comment: NEGATIVE
Comment: NORMAL
Neisseria Gonorrhea: NEGATIVE

## 2020-11-25 LAB — T-HELPER CELL (CD4) - (RCID CLINIC ONLY)
CD4 % Helper T Cell: 29 % — ABNORMAL LOW (ref 33–65)
CD4 T Cell Abs: 320 /uL — ABNORMAL LOW (ref 400–1790)

## 2020-11-28 ENCOUNTER — Ambulatory Visit: Payer: Medicare Other | Admitting: Infectious Diseases

## 2020-11-28 ENCOUNTER — Other Ambulatory Visit: Payer: Medicare Other

## 2020-11-28 ENCOUNTER — Telehealth: Payer: Self-pay

## 2020-11-28 LAB — CYTOLOGY - PAP
Chlamydia: NEGATIVE
Comment: NEGATIVE
Comment: NEGATIVE
Comment: NORMAL
Diagnosis: NEGATIVE
High risk HPV: NEGATIVE
Neisseria Gonorrhea: NEGATIVE

## 2020-11-28 LAB — LIPID PANEL
Cholesterol: 194 mg/dL (ref <200)
HDL: 52 mg/dL (ref >=50)
LDL Cholesterol (Calc): 122 mg/dL (calc) — ABNORMAL HIGH
Non-HDL Cholesterol (Calc): 142 mg/dL (calc) — ABNORMAL HIGH (ref <130)
Total CHOL/HDL Ratio: 3.7 (calc) (ref <5.0)
Triglycerides: 101 mg/dL (ref <150)

## 2020-11-28 LAB — COMPREHENSIVE METABOLIC PANEL
AG Ratio: 1.1 (calc) (ref 1.0–2.5)
ALT: 10 U/L (ref 6–29)
AST: 27 U/L (ref 10–35)
Albumin: 4.2 g/dL (ref 3.6–5.1)
Alkaline phosphatase (APISO): 46 U/L (ref 37–153)
BUN: 13 mg/dL (ref 7–25)
CO2: 20 mmol/L (ref 20–32)
Calcium: 9.8 mg/dL (ref 8.6–10.4)
Chloride: 106 mmol/L (ref 98–110)
Creat: 0.93 mg/dL (ref 0.60–0.93)
Globulin: 4 g/dL (calc) — ABNORMAL HIGH (ref 1.9–3.7)
Glucose, Bld: 87 mg/dL (ref 65–99)
Potassium: 4.6 mmol/L (ref 3.5–5.3)
Sodium: 137 mmol/L (ref 135–146)
Total Bilirubin: 0.6 mg/dL (ref 0.2–1.2)
Total Protein: 8.2 g/dL — ABNORMAL HIGH (ref 6.1–8.1)

## 2020-11-28 LAB — HIV-1 RNA QUANT-NO REFLEX-BLD
HIV 1 RNA Quant: 93 Copies/mL — ABNORMAL HIGH
HIV-1 RNA Quant, Log: 1.97 Log cps/mL — ABNORMAL HIGH

## 2020-11-28 LAB — RPR: RPR Ser Ql: NONREACTIVE

## 2020-11-28 NOTE — Telephone Encounter (Signed)
-----   Message from Blanchard Kelch, NP sent at 11/28/2020 11:20 AM EST ----- Please call Tina Olson to let her know her Pap smear results are normal.  Would recommend this to be repeated in 3 years for routine screening.  Thank you!

## 2020-11-28 NOTE — Telephone Encounter (Signed)
Attempted to call patient. No answer on first attempted number. Left HIPAA compliant message on self-identified voicemail for home number requesting callback.  Sandie Ano, RN

## 2020-11-28 NOTE — Telephone Encounter (Signed)
Patient returned call. RN relayed normal PAP results and guidance per [place]. She will follow up as scheduled with Dr Ninetta Lights. Andree Coss, RN

## 2020-12-13 ENCOUNTER — Ambulatory Visit (INDEPENDENT_AMBULATORY_CARE_PROVIDER_SITE_OTHER): Payer: Medicare Other | Admitting: Infectious Diseases

## 2020-12-13 ENCOUNTER — Other Ambulatory Visit: Payer: Self-pay

## 2020-12-13 ENCOUNTER — Encounter: Payer: Self-pay | Admitting: Infectious Diseases

## 2020-12-13 DIAGNOSIS — B20 Human immunodeficiency virus [HIV] disease: Secondary | ICD-10-CM | POA: Diagnosis not present

## 2020-12-13 DIAGNOSIS — D509 Iron deficiency anemia, unspecified: Secondary | ICD-10-CM | POA: Diagnosis not present

## 2020-12-13 DIAGNOSIS — I10 Essential (primary) hypertension: Secondary | ICD-10-CM

## 2020-12-13 DIAGNOSIS — K5900 Constipation, unspecified: Secondary | ICD-10-CM | POA: Diagnosis not present

## 2020-12-13 NOTE — Progress Notes (Signed)
   Subjective:    Patient ID: Tina Olson, female    DOB: March 22, 1946, 75 y.o.   MRN: [id number]  HPI 74yo F with HIV+/elite controller (can't do study as she is a hard stick), HTN and Hepatitis C (1b). Was started on harvoni in July 2015 for her Hep C (VL 6.5 million) and F0 elastography/ultrasound. She was not able to tolerate this (thinks she took ~ 1/2 of the rx, didn't get last bottle). HerrepeatHep C RNA has been negative (10-04-14) & (06-27-15)  Her GI system has improved, has been using miralax.  She was to have hernia surgery but anesthesia did not work correctly. She left. She is now deffering further surgery.   Has been feeling well. No problems not ever with allergies.   Mammonormal2-2020.Her mammo is pending for 2022. Pap normal12-16-2021. Colon/Endo 06-2017  HIV 1 RNA Quant  Date Value  11/24/2020 93 Copies/mL (H)  06/15/2020 98 copies/mL (H)  12/23/2019 679 copies/mL (H)   CD4 T Cell Abs (/uL)  Date Value  11/24/2020 320 (L)  06/15/2020 317 (L)  12/23/2019 410    Review of Systems  Constitutional: Negative for appetite change and unexpected weight change.  Respiratory: Negative for cough and shortness of breath.   Cardiovascular: Negative for chest pain.  Gastrointestinal: Positive for constipation. Negative for diarrhea.  Genitourinary: Negative for difficulty urinating.  Neurological: Negative for headaches.  Psychiatric/Behavioral: Negative for sleep disturbance.  Please see HPI. All other systems reviewed and negative.      Objective:   Physical Exam Vitals reviewed.  Constitutional:      Appearance: Normal appearance. She is not ill-appearing or diaphoretic.  HENT:     Mouth/Throat:     Mouth: Mucous membranes are moist.  Eyes:     Extraocular Movements: Extraocular movements intact.     Pupils: Pupils are equal, round, and reactive to light.  Cardiovascular:     Rate and Rhythm: Normal rate and regular rhythm.  Pulmonary:      Effort: Pulmonary effort is normal.     Breath sounds: Normal breath sounds.  Abdominal:     General: Bowel sounds are normal. There is no distension.     Palpations: Abdomen is soft.     Tenderness: There is no abdominal tenderness.  Musculoskeletal:     Cervical back: Normal range of motion and neck supple.     Right lower leg: No edema.     Left lower leg: No edema.  Neurological:     Mental Status: She is alert.  Psychiatric:        Mood and Affect: Mood normal.           Assessment & Plan:

## 2020-12-13 NOTE — Assessment & Plan Note (Signed)
She is doing well off art She will schedule her mammo Offered/refused condoms.  Has gotten covid and flu vax.  rtc in 1 year.

## 2020-12-13 NOTE — Assessment & Plan Note (Signed)
Doing better Prn OTC.

## 2020-12-13 NOTE — Assessment & Plan Note (Signed)
Doing well  Appreciate her PCP f/u Is asx.

## 2020-12-13 NOTE — Assessment & Plan Note (Addendum)
Is supplementing diet with brocolli, sipnach, beans  Taking OTC PPI

## 2021-01-13 ENCOUNTER — Other Ambulatory Visit: Payer: Self-pay | Admitting: Infectious Diseases

## 2021-01-13 DIAGNOSIS — Z1231 Encounter for screening mammogram for malignant neoplasm of breast: Secondary | ICD-10-CM

## 2021-03-02 ENCOUNTER — Other Ambulatory Visit: Payer: Self-pay

## 2021-03-02 ENCOUNTER — Ambulatory Visit
Admission: RE | Admit: 2021-03-02 | Discharge: 2021-03-02 | Disposition: A | Discharge status: Home / Self Care | Payer: Medicare Other | Source: Ambulatory Visit | Attending: Infectious Diseases | Admitting: Infectious Diseases

## 2021-03-02 DIAGNOSIS — Z1231 Encounter for screening mammogram for malignant neoplasm of breast: Secondary | ICD-10-CM

## 2021-05-09 ENCOUNTER — Telehealth: Payer: Self-pay | Admitting: *Deleted

## 2021-05-09 NOTE — Telephone Encounter (Signed)
Patient called to make an appointment with DR Ninetta Lights. She states her primary care provider told her that she is anemic and asked her to follow up with Dr Ninetta Lights. Front desk scheduled appointment 05/18/21, updated PCP information. RN will reach out to PCP for last lab, note for Dr Ninetta Lights to address. Andree Coss, RN

## 2021-05-12 ENCOUNTER — Telehealth: Payer: Self-pay | Admitting: *Deleted

## 2021-05-12 NOTE — Telephone Encounter (Signed)
Called patient's PCP office, requested medical records for follow up of anemia at patient's request. PCP to fax to [phone]. Patient has also been referred to [facility] by PCP. Andree Coss, RN

## 2021-05-18 ENCOUNTER — Encounter: Payer: Self-pay | Admitting: Infectious Diseases

## 2021-05-18 ENCOUNTER — Other Ambulatory Visit: Payer: Self-pay

## 2021-05-18 ENCOUNTER — Ambulatory Visit (INDEPENDENT_AMBULATORY_CARE_PROVIDER_SITE_OTHER): Payer: Medicare Other | Admitting: Infectious Diseases

## 2021-05-18 VITALS — BP 137/72 | HR 60 | Wt 125.6 lb

## 2021-05-18 DIAGNOSIS — B20 Human immunodeficiency virus [HIV] disease: Secondary | ICD-10-CM | POA: Diagnosis not present

## 2021-05-18 DIAGNOSIS — Z79899 Other long term (current) drug therapy: Secondary | ICD-10-CM

## 2021-05-18 DIAGNOSIS — K921 Melena: Secondary | ICD-10-CM | POA: Diagnosis not present

## 2021-05-18 DIAGNOSIS — K5900 Constipation, unspecified: Secondary | ICD-10-CM | POA: Diagnosis not present

## 2021-05-18 DIAGNOSIS — Z113 Encounter for screening for infections with a predominantly sexual mode of transmission: Secondary | ICD-10-CM | POA: Diagnosis not present

## 2021-05-18 DIAGNOSIS — I10 Essential (primary) hypertension: Secondary | ICD-10-CM

## 2021-05-18 MED ORDER — HYDROCHLOROTHIAZIDE 25 MG PO TABS: 25.0000 mg | ORAL_TABLET | Freq: Every day | ORAL | 11 refills | Status: DC

## 2021-05-18 NOTE — Assessment & Plan Note (Signed)
will check her labs today She is not on art Has had mild gradual decrease in CD4.  Will see her back in 9 months.

## 2021-05-18 NOTE — Addendum Note (Signed)
Addended by: Valarie Cones on: 05/18/2021 04:20 PM   Modules accepted: Orders

## 2021-05-18 NOTE — Assessment & Plan Note (Signed)
Not clear if true melena With her hx of hemmerrhoids, DOE and anemia by her hx from her PCP, will ask GI to see her.

## 2021-05-18 NOTE — Assessment & Plan Note (Signed)
She appears to be on a "stable regiemen".  No change in this for now.  Will have her see GI.

## 2021-05-18 NOTE — Progress Notes (Signed)
Subjective:    Patient ID: Tina Olson, female  DOB: [birth year], 75 y.o.        MRN: [id number]   HPI 75 yo F with HIV+/elite controller (can't do study as she is a hard stick), HTN and Hepatitis C (1b). Was started on harvoni in July 2015 for her Hep C (VL 6.5 million) and F0 elastography/ultrasound. She was not able to tolerate this (thinks she took ~ 1/2 of the rx, didn't get last bottle). Her repeat Hep C RNA has been negative (10-04-14) & (06-27-15) Today she is concerned that she has become anemic. She has a hx of bleeding hemorrhoids. She has had DOE.  She is on regular regimen (QOD) that improves her BM. She had dark BM this am.  She has had 2 prev colonoscopies. She has appt with GI 06-14-21.  Has been eating more leafy vegetables, red meat.  She has PCP f/u 05-22-21.   HIV 1 RNA Quant  Date Value  11/24/2020 93 Copies/mL (H)  06/15/2020 98 copies/mL (H)  12/23/2019 679 copies/mL (H)   CD4 T Cell Abs (/uL)  Date Value  11/24/2020 320 (L)  06/15/2020 317 (L)  12/23/2019 410     Health Maintenance  Topic Date Due  . Pneumococcal Vaccine 35-68 Years old (1 - PCV) Never done  . TETANUS/TDAP  Never done  . Zoster Vaccines- Shingrix (1 of 2) Never done  . DEXA SCAN  Never done  . COVID-19 Vaccine (4 - Booster for Pfizer series) 02/01/2021  . INFLUENZA VACCINE  07/10/2021  . MAMMOGRAM  03/03/2023  . COLONOSCOPY (Pts 45-78yrs Insurance coverage will need to be confirmed)  06/12/2027  . Hepatitis C Screening  Completed  . PNA vac Low Risk Adult  Completed  . HPV VACCINES  Aged Out    Review of Systems  Constitutional:  Negative for chills, fever and weight loss.  Respiratory:  Positive for shortness of breath.   Cardiovascular:  Positive for palpitations.  Gastrointestinal:  Positive for constipation. Negative for blood in stool, diarrhea and melena.  Genitourinary:  Negative for dysuria.   Please see HPI. All other systems reviewed and negative.      Objective:  Physical Exam Vitals reviewed.  Constitutional:      Appearance: Normal appearance. She is not ill-appearing or diaphoretic.  HENT:     Mouth/Throat:     Mouth: Mucous membranes are moist.     Pharynx: No oropharyngeal exudate.  Eyes:     Extraocular Movements: Extraocular movements intact.     Pupils: Pupils are equal, round, and reactive to light.  Cardiovascular:     Rate and Rhythm: Normal rate and regular rhythm.  Pulmonary:     Effort: Pulmonary effort is normal.     Breath sounds: Normal breath sounds.  Abdominal:     General: Bowel sounds are normal. There is distension.     Palpations: Abdomen is soft.     Hernia: A hernia (R inguinal, easily reducible.) is present.  Musculoskeletal:     Cervical back: No rigidity.     Right lower leg: No edema.     Left lower leg: No edema.  Lymphadenopathy:     Cervical: No cervical adenopathy.  Neurological:     General: No focal deficit present.     Mental Status: She is alert.  Psychiatric:        Mood and Affect: Mood normal.          Assessment & Plan:

## 2021-06-14 ENCOUNTER — Ambulatory Visit (INDEPENDENT_AMBULATORY_CARE_PROVIDER_SITE_OTHER): Payer: Medicare Other | Admitting: Gastroenterology

## 2021-06-14 ENCOUNTER — Encounter: Payer: Self-pay | Admitting: Gastroenterology

## 2021-06-14 VITALS — BP 110/60 | HR 64 | Ht 60.25 in | Wt 127.0 lb

## 2021-06-14 DIAGNOSIS — D649 Anemia, unspecified: Secondary | ICD-10-CM | POA: Diagnosis not present

## 2021-06-14 DIAGNOSIS — K5900 Constipation, unspecified: Secondary | ICD-10-CM | POA: Diagnosis not present

## 2021-06-14 NOTE — Progress Notes (Signed)
06/14/2021 Tina Olson [id number] Feb 13, 1946   HISTORY OF PRESENT ILLNESS: This is a 75 year old female who is a patient of Dr. Elana Alm.  She was seen and evaluated by Dr. Lavon Paganini in July 2018 for anemia and heme positive stools.  EGD 06/2017 showed the following:  - LA Grade B reflux esophagitis. - Benign-appearing esophageal stenosis. - Non-bleeding grade I and small (< 5 mm) esophageal varices. - Gastritis. - Normal examined duodenum. - No specimens collected.  Colonoscopy showed only nonbleeding internal hemorrhoids.  She is here today for evaluation of iron deficiency anemia.  She tells me that she has a new PCP.  She said that she had labs drawn back in May and was told she was anemic, but then she had repeat labs done by Dr. Ninetta Lights in June at which time her hemoglobin was 10.3 g, which is stable compared to several labs over the past 3 years or more.  Her MCV was normal at 86.5.  Iron level was normal at 94.  She tells me that after she was told that her hemoglobin was low she started eating lots of greens, red meat, liver, etc.  She says that she had started eating mostly poultry and fish for a while.  She denies any evidence of black or bloody stools.  She is currently using prune juice and MiraLAX for her constipation and says that is working well.  She has a past medical history of HIV on treatment, history of hepatitis C for which she was treated with Harvoni.  Past Medical History:  Diagnosis Date   Acid reflux    Anemia 2017   Difficult intravenous access    Hepatitis C 07/2014   took harvani tx    History of blood transfusion 2017   2 units   HIV infection (HCC) since 1993   Hypertension    Internal hemorrhoid    Right inguinal hernia    Past Surgical History:  Procedure Laterality Date   BREAST BIOPSY Right 07/26/2008   CATARACT EXTRACTION Right 2016   CATARACT EXTRACTION Left 06/2019   COLONOSCOPY WITH PROPOFOL N/A 06/11/2017   Procedure:  COLONOSCOPY WITH PROPOFOL;  Surgeon: Napoleon Form, MD;  Location: [facility];  Service: Endoscopy;  Laterality: N/A;  PT IS KNOWN HEART STICK. WILL PROBABLY REQUIRE ULTRASOUND OR ANESTHESIA FOR IV STICK.   ESOPHAGOGASTRODUODENOSCOPY (EGD) WITH PROPOFOL N/A 06/11/2017   Procedure: ESOPHAGOGASTRODUODENOSCOPY (EGD) WITH PROPOFOL;  Surgeon: Napoleon Form, MD;  Location: [facility];  Service: Endoscopy;  Laterality: N/A;    reports that she quit smoking about 20 years ago. Her smoking use included cigarettes. She has never used smokeless tobacco. She reports that she does not drink alcohol and does not use drugs. family history includes Alzheimer's disease in her mother; Cancer in her father; Glaucoma in her mother; Hypertension in her mother; Seizures in her son. Allergies  Allergen Reactions   Lisinopril Itching and Cough   Other Hives and Itching     Seafood crustaceans  "itchy throat"      Outpatient Encounter Medications as of 06/14/2021  Medication Sig   acetaminophen (TYLENOL) 325 MG tablet Take 325 mg by mouth daily as needed for moderate pain or headache.   amLODipine (NORVASC) 10 MG tablet Take 10 mg by mouth daily.   azaTHIOprine (IMURAN) 50 MG tablet Take 25 mg by mouth daily.   BIOTIN PO Take 500 mcg by mouth daily.   Calcium Carbonate-Vitamin D (CALTRATE 600+D PO) Take 1 tablet  by mouth daily.   docusate sodium (COLACE) 100 MG capsule Take 100 mg by mouth as needed for mild constipation.   fluticasone (FLONASE) 50 MCG/ACT nasal spray Place 1 spray into both nostrils daily as needed for allergies or rhinitis.   Misc Natural Products (NEURIVA PO) Take 1 tablet by mouth daily.   Omega-3 Fatty Acids (FISH OIL) 1200 MG CAPS Take 1,200 mg by mouth daily.   omeprazole (PRILOSEC OTC) 20 MG tablet Take 20 mg by mouth daily.   polyethylene glycol (MIRALAX / GLYCOLAX) packet Take 17 g by mouth daily as needed for mild constipation.    valACYclovir (VALTREX) 1000 MG tablet Take  1,000 mg by mouth daily.    zinc gluconate 50 MG tablet Take 50 mg by mouth daily.   [DISCONTINUED] hydrochlorothiazide (HYDRODIURIL) 25 MG tablet Take 1 tablet (25 mg total) by mouth daily.   [DISCONTINUED] omeprazole (PRILOSEC) 40 MG capsule Take 1 capsule (40 mg total) by mouth 2 (two) times daily at 8 am and 10 pm. (Patient taking differently: Take 40 mg by mouth daily. )   [DISCONTINUED] UNABLE TO FIND Med Name: caltrate 1 daily   No facility-administered encounter medications on file as of 06/14/2021.     REVIEW OF SYSTEMS  : All other systems reviewed and negative except where noted in the History of Present Illness.   PHYSICAL EXAM: BP 110/60 (BP Location: Left Arm, Patient Position: Sitting, Cuff Size: Normal)   Pulse 64   Ht 5' 0.25" (1.53 m) Comment: height measured without shoes  Wt 127 lb (57.6 kg)   BMI 24.60 kg/m  General: Well developed AA female in no acute distress Head: Normocephalic and atraumatic Eyes:  Sclerae anicteric, conjunctiva pink. Ears: Normal auditory acuity Lungs: Clear throughout to auscultation; no W/R/R. Heart: Regular rate and rhythm; no M/R/G. Abdomen: Soft, non-distended.  BS present.  Non--tender. Musculoskeletal: Symmetrical with no gross deformities  Skin: No lesions on visible extremities Extremities: No edema  Neurological: Alert oriented x 4, grossly non-focal Psychological:  Alert and cooperative. Normal mood and affect  ASSESSMENT AND PLAN: *Normocytic anemia: She has had some degree of anemia for the past 4 to 5 years.  Currently on the most recent labs about a month ago her hemoglobin was stable.  MCV was normal.  Iron levels were normal.  Was evaluated with EGD and colonoscopy in July 2018 for the same issue with only hemorrhoids and esophagitis noted.  There was also mention of very small esophageal varices, but there is no sign of cirrhotic changes of liver by imaging and she had patent vessels.  She has had no overt sign of GI  bleeding with black or bloody stools.  We will just continue to monitor for now and have periodic labs performed by her PCP and/or infectious disease. *Chronic constipation: Well-controlled on prune juice and MiraLAX.  She will continue that.  CC:  Verlon Au, MD

## 2021-06-14 NOTE — Patient Instructions (Signed)
Follow up as needed.  If you are age 73 or older, your body mass index should be between 23-30. Your Body mass index is 24.6 kg/m. If this is out of the aforementioned range listed, please consider follow up with your Primary Care Provider.  If you are age 23 or younger, your body mass index should be between 19-25. Your Body mass index is 24.6 kg/m. If this is out of the aformentioned range listed, please consider follow up with your Primary Care Provider.   __________________________________________________________  The [facility] providers would like to encourage you to use [facility] to communicate with providers for non-urgent requests or questions.  Due to long hold times on the telephone, sending your provider a message by [facility] may be a faster and more efficient way to get a response.  Please allow 48 business hours for a response.  Please remember that this is for non-urgent requests.

## 2021-06-20 LAB — IRON: Iron: 94 ug/dL (ref 45–160)

## 2021-06-20 LAB — RPR: RPR Ser Ql: NONREACTIVE

## 2021-06-20 LAB — COMPREHENSIVE METABOLIC PANEL
AG Ratio: 0.9 (calc) — ABNORMAL LOW (ref 1.0–2.5)
ALT: 19 U/L (ref 6–29)
AST: 23 U/L (ref 10–35)
Albumin: 4.2 g/dL (ref 3.6–5.1)
Alkaline phosphatase (APISO): 50 U/L (ref 37–153)
BUN/Creatinine Ratio: 19 (calc) (ref 6–22)
BUN: 22 mg/dL (ref 7–25)
CO2: 20 mmol/L (ref 20–32)
Calcium: 10 mg/dL (ref 8.6–10.4)
Chloride: 106 mmol/L (ref 98–110)
Creat: 1.15 mg/dL — ABNORMAL HIGH (ref 0.60–0.93)
Globulin: 4.5 g/dL (calc) — ABNORMAL HIGH (ref 1.9–3.7)
Glucose, Bld: 117 mg/dL — ABNORMAL HIGH (ref 65–99)
Potassium: 3.7 mmol/L (ref 3.5–5.3)
Sodium: 140 mmol/L (ref 135–146)
Total Bilirubin: 0.9 mg/dL (ref 0.2–1.2)
Total Protein: 8.7 g/dL — ABNORMAL HIGH (ref 6.1–8.1)

## 2021-06-20 LAB — T-HELPER CELLS (CD4) COUNT (NOT AT ARMC)
Absolute CD4: 376 cells/uL — ABNORMAL LOW (ref 490–1740)
CD4 T Helper %: 36 % (ref 30–61)
Total lymphocyte count: 1046 cells/uL (ref 850–3900)

## 2021-06-20 LAB — CBC
HCT: 30.8 % — ABNORMAL LOW (ref 35.0–45.0)
Hemoglobin: 10.3 g/dL — ABNORMAL LOW (ref 11.7–15.5)
MCH: 28.9 pg (ref 27.0–33.0)
MCHC: 33.4 g/dL (ref 32.0–36.0)
MCV: 86.5 fL (ref 80.0–100.0)
MPV: 12.2 fL (ref 7.5–12.5)
Platelets: 212 10*3/uL (ref 140–400)
RBC: 3.56 10*6/uL — ABNORMAL LOW (ref 3.80–5.10)
RDW: 17 % — ABNORMAL HIGH (ref 11.0–15.0)
WBC: 2.4 10*3/uL — ABNORMAL LOW (ref 3.8–10.8)

## 2021-06-20 LAB — HIV-1 RNA ULTRAQUANT REFLEX TO GENTYP+
HIV 1 RNA Quant: 118 copies/mL — ABNORMAL HIGH
HIV-1 RNA Quant, Log: 2.07 Log copies/mL — ABNORMAL HIGH

## 2021-07-24 NOTE — Progress Notes (Signed)
Reviewed and agree with documentation and assessment and plan. K. Veena Jabarri Stefanelli , MD   

## 2021-12-13 ENCOUNTER — Other Ambulatory Visit: Payer: Medicare Other

## 2021-12-28 ENCOUNTER — Encounter: Payer: Medicare Other | Admitting: Infectious Diseases

## 2022-02-15 ENCOUNTER — Ambulatory Visit (INDEPENDENT_AMBULATORY_CARE_PROVIDER_SITE_OTHER): Payer: Medicare Other | Admitting: Infectious Diseases

## 2022-02-15 ENCOUNTER — Other Ambulatory Visit: Payer: Self-pay

## 2022-02-15 ENCOUNTER — Telehealth: Payer: Self-pay

## 2022-02-15 VITALS — BP 136/79 | HR 57 | Resp 16 | Ht 60.03 in | Wt 123.0 lb

## 2022-02-15 DIAGNOSIS — B20 Human immunodeficiency virus [HIV] disease: Secondary | ICD-10-CM | POA: Diagnosis not present

## 2022-02-15 DIAGNOSIS — Z23 Encounter for immunization: Secondary | ICD-10-CM | POA: Diagnosis not present

## 2022-02-15 DIAGNOSIS — I10 Essential (primary) hypertension: Secondary | ICD-10-CM | POA: Diagnosis not present

## 2022-02-15 DIAGNOSIS — N6313 Unspecified lump in the right breast, lower outer quadrant: Secondary | ICD-10-CM

## 2022-02-15 DIAGNOSIS — Z113 Encounter for screening for infections with a predominantly sexual mode of transmission: Secondary | ICD-10-CM

## 2022-02-15 DIAGNOSIS — Z79899 Other long term (current) drug therapy: Secondary | ICD-10-CM

## 2022-02-15 DIAGNOSIS — K5900 Constipation, unspecified: Secondary | ICD-10-CM | POA: Diagnosis not present

## 2022-02-15 NOTE — Progress Notes (Signed)
? ?  Subjective:  ? ? Patient ID: Tina Olson, female  DOB: 1946-07-11, 76 y.o.        MRN: [id number] ? ? ?HPI ?76 yo F with HIV+/elite controller (can't do study as she is a hard stick), HTN and Hepatitis C (1b). ?Was started on harvoni in July 2015 for her Hep C (VL 6.5 million) and F0 elastography/ultrasound. She was not able to tolerate this (thinks she took ~ 1/2 of the rx, didn't get last bottle). Her repeat Hep C RNA has been negative (10-04-14) & (06-27-15) ? ?She is on regular regimen (QOD) that improved her BM. ?She has had 2 prev colonoscopies. She has appt with GI 06-14-21.  ?She has seen optho for pseudophakia, cataracts, focal chorioretinal inflammation. She feels like her vision is good, she takes rx (imuran, valtrex) for this.   ? ?States she has bleeding hemmeroids. At her last /fu, she states they didn't do anything for her.  ?No ART. ? ?Prev breast bx 2009- calcifications.  ? ?last COVID 04-2021.  ? ?HIV 1 RNA Quant  ?Date Value  ?05/18/2021 118 copies/mL (H)  ?11/24/2020 93 Copies/mL (H)  ?06/15/2020 98 copies/mL (H)  ? ?CD4 T Cell Abs (/uL)  ?Date Value  ?11/24/2020 320 (L)  ?06/15/2020 317 (L)  ?12/23/2019 410  ? ? ? ?Health Maintenance  ?Topic Date Due  ?? TETANUS/TDAP  Never done  ?? Zoster Vaccines- Shingrix (1 of 2) Never done  ?? DEXA SCAN  Never done  ?? COVID-19 Vaccine (4 - Booster for Pfizer series) 12/27/2020  ?? INFLUENZA VACCINE  07/10/2021  ?? MAMMOGRAM  03/03/2023  ?? COLONOSCOPY (Pts 45-66yrs Insurance coverage will need to be confirmed)  06/12/2027  ?? Pneumonia Vaccine 11+ Years old  Completed  ?? Hepatitis C Screening  Completed  ?? HPV VACCINES  Aged Out  ? ? ?Review of Systems  ?Constitutional:  Negative for chills, fever and weight loss.  ?HENT:  Negative for nosebleeds.   ?Respiratory:  Negative for cough and shortness of breath.   ?Gastrointestinal:  Negative for constipation and diarrhea.  ?Genitourinary:  Negative for dysuria.  ? ?Please see HPI. All other systems  reviewed and negative. ? ?   ?Objective:  ?Physical Exam ?Vitals reviewed.  ?Constitutional:   ?   General: She is not in acute distress. ?   Appearance: Normal appearance. She is not toxic-appearing.  ?HENT:  ?   Mouth/Throat:  ?   Mouth: Mucous membranes are moist.  ?   Pharynx: No oropharyngeal exudate.  ?Eyes:  ?   Extraocular Movements: Extraocular movements intact.  ?   Pupils: Pupils are equal, round, and reactive to light.  ?Cardiovascular:  ?   Rate and Rhythm: Normal rate and regular rhythm.  ?Pulmonary:  ?   Effort: Pulmonary effort is normal.  ?   Breath sounds: Normal breath sounds.  ?Abdominal:  ?   General: Bowel sounds are normal. There is no distension.  ?   Palpations: Abdomen is soft.  ?   Tenderness: There is no abdominal tenderness.  ?Musculoskeletal:     ?   General: Normal range of motion.  ?   Cervical back: Normal range of motion and neck supple.  ?Neurological:  ?   General: No focal deficit present.  ?   Mental Status: She is alert.  ?Psychiatric:     ?   Mood and Affect: Mood normal.  ? ? ? ? ? ?   ?Assessment & Plan:  ? ?

## 2022-02-15 NOTE — Assessment & Plan Note (Signed)
Slightly elevated ?Appreciate her PCP f/u.  ? ?

## 2022-02-15 NOTE — Assessment & Plan Note (Signed)
imrpoved with BM qod.  ?Using miralax. Colace.  ?

## 2022-02-15 NOTE — Telephone Encounter (Signed)
Attempted to call patient to schedule appt with lab to collect missing lavender tube. Left voicemail requesting she call office back and schedule appt. ?Juanita Laster, RMA ? ?

## 2022-02-15 NOTE — Addendum Note (Signed)
Addended by: Juanita Laster on: 02/15/2022 04:57 PM ? ? Modules accepted: Orders ? ?

## 2022-02-15 NOTE — Assessment & Plan Note (Signed)
Given her age, query if mammo can be done every other year.... ?

## 2022-02-15 NOTE — Assessment & Plan Note (Signed)
She is doing well ?Will check her labs today ?COVID vax (we are out) ?Flu vax today ?Needs shingles vax as well.  ?She needs mammo next year (will defer timing to PCP?) ?rtc in 9 months.  ?

## 2022-02-16 NOTE — Telephone Encounter (Signed)
Per Dr Ninetta Lights, ? ?Patient is a hard stick. She was here late and missed transportation then had to wait an hour longer for her ride. Okay to get labs next visit if missing lav tube. Advised Lab also.  ?

## 2022-02-17 LAB — COMPREHENSIVE METABOLIC PANEL
AG Ratio: 0.9 (calc) — ABNORMAL LOW (ref 1.0–2.5)
ALT: 11 U/L (ref 6–29)
AST: 21 U/L (ref 10–35)
Albumin: 4 g/dL (ref 3.6–5.1)
Alkaline phosphatase (APISO): 52 U/L (ref 37–153)
BUN: 18 mg/dL (ref 7–25)
CO2: 20 mmol/L (ref 20–32)
Calcium: 9.8 mg/dL (ref 8.6–10.4)
Chloride: 106 mmol/L (ref 98–110)
Creat: 0.91 mg/dL (ref 0.60–1.00)
Globulin: 4.4 g/dL (calc) — ABNORMAL HIGH (ref 1.9–3.7)
Glucose, Bld: 107 mg/dL — ABNORMAL HIGH (ref 65–99)
Potassium: 4.1 mmol/L (ref 3.5–5.3)
Sodium: 138 mmol/L (ref 135–146)
Total Bilirubin: 0.5 mg/dL (ref 0.2–1.2)
Total Protein: 8.4 g/dL — ABNORMAL HIGH (ref 6.1–8.1)

## 2022-02-17 LAB — LIPID PANEL
Cholesterol: 191 mg/dL (ref <200)
HDL: 58 mg/dL (ref >=50)
LDL Cholesterol (Calc): 112 mg/dL (calc) — ABNORMAL HIGH
Non-HDL Cholesterol (Calc): 133 mg/dL (calc) — ABNORMAL HIGH (ref <130)
Total CHOL/HDL Ratio: 3.3 (calc) (ref <5.0)
Triglycerides: 104 mg/dL (ref <150)

## 2022-02-17 LAB — T-HELPER CELLS (CD4) COUNT (NOT AT ARMC)
Absolute CD4: 388 cells/uL — ABNORMAL LOW (ref 490–1740)
CD4 T Helper %: 35 % (ref 30–61)
Total lymphocyte count: 1120 cells/uL (ref 850–3900)

## 2022-02-17 LAB — HIV-1 RNA QUANT-NO REFLEX-BLD
HIV 1 RNA Quant: 31 Copies/mL — ABNORMAL HIGH
HIV-1 RNA Quant, Log: 1.48 Log cps/mL — ABNORMAL HIGH

## 2022-02-17 LAB — RPR: RPR Ser Ql: NONREACTIVE

## 2022-03-07 ENCOUNTER — Other Ambulatory Visit: Payer: Self-pay

## 2022-03-07 ENCOUNTER — Encounter (HOSPITAL_COMMUNITY): Payer: Self-pay

## 2022-03-07 ENCOUNTER — Ambulatory Visit (HOSPITAL_COMMUNITY)
Admission: EM | Admit: 2022-03-07 | Discharge: 2022-03-07 | Disposition: A | Discharge status: Home / Self Care | Payer: Medicare Other | Attending: Internal Medicine | Admitting: Internal Medicine

## 2022-03-07 DIAGNOSIS — J069 Acute upper respiratory infection, unspecified: Secondary | ICD-10-CM | POA: Diagnosis not present

## 2022-03-07 NOTE — ED Triage Notes (Signed)
Pt presents today with headache, body aches, chills, sore throat, coughing, and runny nose for the past 4 days.Marland Kitchen  ?

## 2022-03-07 NOTE — Discharge Instructions (Signed)
We have tested you for COVID and the results will likely come back tomorrow.   If you have difficulty breathing, chest pain, you are vomiting and can't keep any liquids down and you aren't urinating at least 50% of your normal amount, you should be seen at the emergency room right away.  If you aren't improving over the next week, please follow up with your regular medical provider. ° °For your congestion, you can use nasal saline spray.  You can also use a humidifier.  You can also use honey as needed for cough by the spoonful or in a warm liquid (do not give honey to an infant less than a year old).  °

## 2022-03-07 NOTE — ED Provider Notes (Addendum)
?[place] ? ? ? ?CSN: [id number] ?Arrival date & time: 03/07/22  [id number] ? ? ?  ? ?History   ?Chief Complaint ?Chief Complaint  ?Patient presents with  ? Headache  ? ? ?HPI ?Tina Olson is a 76 y.o. female.  ? ?Cough ?Started 3 days ago ?No fevers until today ?Endorses congestion, rhinorrhea, sore throat, headache, fatigue, myalgias ?Denies nausea, vomiting, diarrhea, chest pain, shortness of breath ?Has a dry cough ?Has been drinking normally, has normal UOP  ?Son here with symptoms that started a few days before hers and he has now improved ?Has not been tested for COVID  ? ? ? ?Past Medical History:  ?Diagnosis Date  ? Acid reflux   ? Anemia 2017  ? Difficult intravenous access   ? Hepatitis C 07/2014  ? took harvani tx   ? History of blood transfusion 2017  ? 2 units  ? HIV infection ([place]) since 1993  ? Hypertension   ? Internal hemorrhoid   ? Right inguinal hernia   ? ? ?Patient Active Problem List  ? Diagnosis Date Noted  ? Normocytic anemia 06/14/2021  ? Screening for cervical cancer 11/24/2020  ? Atherosclerosis of aorta ([place]) 06/30/2020  ? COPD (chronic obstructive pulmonary disease) ([place]) 06/30/2020  ? Chest pain 01/26/2017  ? Microcytic anemia 01/26/2017  ? Acute kidney injury ([place]) 01/26/2017  ? 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 Constipation 01/26/2017  ? Hypertension 10/08/2011  ? [place] ALLERGY 10/20/2009  ? Human immunodeficiency virus (HIV) disease Education administrator) 04/18/2009  ? BREAST MASS, BENIGN 04/18/2009  ? Melena 04/18/2009  ? ? ?Past Surgical History:  ?Procedure Laterality Date  ? BREAST BIOPSY Right 07/26/2008  ? CATARACT EXTRACTION Right 2016  ? CATARACT EXTRACTION Left 06/2019  ? COLONOSCOPY WITH PROPOFOL N/A 06/11/2017  ? Procedure: COLONOSCOPY WITH PROPOFOL;  Surgeon: Mauri Pole, MD;  Location: [facility];  Service: Endoscopy;  Laterality: N/A;  PT IS KNOWN HEART STICK. WILL PROBABLY REQUIRE ULTRASOUND OR ANESTHESIA FOR IV STICK.  ?  ESOPHAGOGASTRODUODENOSCOPY (EGD) WITH PROPOFOL N/A 06/11/2017  ? Procedure: ESOPHAGOGASTRODUODENOSCOPY (EGD) WITH PROPOFOL;  Surgeon: Mauri Pole, MD;  Location[facility];  Service: Endoscopy;  Laterality: N/A;  ? ? ?OB History   ?No obstetric history on file. ?  ? ? ? ?Home Medications   ? ?Prior to Admission medications   ?Medication Sig Start Date End Date Taking? Authorizing Provider  ?acetaminophen (TYLENOL) 325 MG tablet Take 325 mg by mouth daily as needed for moderate pain or headache.    [provider]  ?amLODipine (NORVASC) 10 MG tablet Take 10 mg by mouth daily. 04/13/21   [provider]  ?azaTHIOprine (IMURAN) 50 MG tablet Take 25 mg by mouth daily. 07/08/20   [provider]  ?BIOTIN PO Take 500 mcg by mouth daily.    [provider]  ?Calcium Carbonate-Vitamin D (CALTRATE 600+D PO) Take 1 tablet by mouth daily.    [provider]  ?docusate sodium (COLACE) 100 MG capsule Take 100 mg by mouth as needed for mild constipation.    [provider]  ?fluticasone (FLONASE) 50 MCG/ACT nasal spray Place 1 spray into both nostrils daily as needed for allergies or rhinitis.    [provider]  ?Misc Natural Products (NEURIVA PO) Take 1 tablet by mouth daily.    [provider]  ?Omega-3 Fatty Acids (FISH OIL) 1200 MG CAPS Take 1,200 mg by mouth daily.    [provider]  ?omeprazole (PRILOSEC OTC)  20 MG tablet Take 20 mg by mouth daily.    [provider]  ?polyethylene glycol (MIRALAX / GLYCOLAX) packet Take 17 g by mouth daily as needed for mild constipation.     [provider]  ?valACYclovir (VALTREX) 1000 MG tablet Take 1,000 mg by mouth daily.  12/22/19   [provider]  ?zinc gluconate 50 MG tablet Take 50 mg by mouth daily.    [provider]  ? ? ?Family History ?Family History  ?Problem Relation Age of Onset  ? Alzheimer's disease Mother   ? Glaucoma Mother   ? Hypertension  Mother   ? Cancer Father   ?     ? type  ? Seizures Son   ? ? ?Social History ?Social History  ? ?Tobacco Use  ? Smoking status: Former  ?  Types: Cigarettes  ?  Quit date: 12/10/2000  ?  Years since quitting: 21.2  ? Smokeless tobacco: Never  ?Vaping Use  ? Vaping Use: Never used  ?Substance Use Topics  ? Alcohol use: No  ?  Alcohol/week: 0.0 standard drinks  ? Drug use: No  ?  Types: IV  ?  Comment: clean since around 2000  ? ? ? ?Allergies   ?Lisinopril and Other ? ? ?Review of Systems ?Review of Systems  ?All other systems reviewed and are negative. ? ?Per HPI ?Physical Exam ?Triage Vital Signs ?ED Triage Vitals  ?Enc Vitals Group  ?   BP   ?   Pulse   ?   Resp   ?   Temp   ?   Temp src   ?   SpO2   ?   Weight   ?   Height   ?   Head Circumference   ?   Peak Flow   ?   Pain Score   ?   Pain Loc   ?   Pain Edu?   ?   Excl. in [place]?   ? ?No data found. ? ?Updated Vital Signs ?BP (!) 144/63   Pulse 78   Temp (!) 100.6 ?F (38.1 ?C) (Oral)   Resp 18   SpO2 99%  ? ?Visual Acuity ?Right Eye Distance:   ?Left Eye Distance:   ?Bilateral Distance:   ? ?Right Eye Near:   ?Left Eye Near:    ?Bilateral Near:    ? ?Physical Exam ?Constitutional:   ?   General: She is not in acute distress. ?   Appearance: She is well-developed. She is not ill-appearing or toxic-appearing.  ?HENT:  ?   Head: Normocephalic and atraumatic.  ?   Right Ear: Tympanic membrane, ear canal and external ear normal.  ?   Left Ear: Tympanic membrane, ear canal and external ear normal.  ?   Nose: Congestion and rhinorrhea present.  ?   Mouth/Throat:  ?   Mouth: Mucous membranes are moist.  ?   Pharynx: No oropharyngeal exudate or posterior oropharyngeal erythema.  ?Eyes:  ?   Conjunctiva/sclera: Conjunctivae normal.  ?Cardiovascular:  ?   Rate and Rhythm: Normal rate.  ?Pulmonary:  ?   Effort: Pulmonary effort is normal. No respiratory distress.  ?   Breath sounds: Normal breath sounds. No wheezing, rhonchi or rales.  ?Musculoskeletal:  ?   Cervical back:  Neck supple. No rigidity or tenderness.  ?Lymphadenopathy:  ?   Cervical: No cervical adenopathy.  ?Skin: ?   General: Skin is warm and dry.  ?   Capillary Refill:  Capillary refill takes less than 2 seconds.  ?Neurological:  ?   Mental Status: She is alert and oriented to person, place, and time.  ? ? ? ?UC Treatments / Results  ?Labs ?(all labs ordered are listed, but only abnormal results are displayed) ?Labs Reviewed  ?SARS CORONAVIRUS 2 (TAT 6-24 HRS)  ? ? ?EKG ? ? ?Radiology ?No results found. ? ?Procedures ?Procedures (including critical care time) ? ?Medications Ordered in UC ?Medications - No data to display ? ?Initial Impression / Assessment and Plan / UC Course  ?I have reviewed the triage vital signs and the nursing notes. ? ?Pertinent labs & imaging results that were available during my care of the patient were reviewed by me and considered in my medical decision making (see chart for details). ? ?  ? ?VSS.  COVID test performed.  POC Influenza test deferred as past time of tamiflu effectiveness.  No increase sputum production, shortness of breath, or concerning findings on lung examination, no indication for chest x-ray at this time.  Advised of OTC treatments and ED precautions, see AVS.   ? ? ?Final Clinical Impressions(s) / UC Diagnoses  ? ?Final diagnoses:  ?Viral URI  ? ? ? ?Discharge Instructions   ? ?  ?We have tested you for COVID and the results will likely come back tomorrow.   If you have difficulty breathing, chest pain, you are vomiting and can't keep any liquids down and you aren't urinating at least 50% of your normal amount, you should be seen at the emergency room right away.  If you aren't improving over the next week, please follow up with your regular medical provider. ? ?For your congestion, you can use nasal saline spray.  You can also use a humidifier.  You can also use honey as needed for cough by the spoonful or in a warm liquid (do not give honey to an infant less than a year  old).  ? ? ? ? ?ED Prescriptions   ?None ?  ? ?PDMP not reviewed this encounter. ?  ?Cleophas Dunker, DO ?03/07/22 1107 ? ?  ?Cleophas Dunker, DO ?03/07/22 1108 ? ?

## 2022-03-09 ENCOUNTER — Telehealth (HOSPITAL_COMMUNITY): Payer: Self-pay | Admitting: Emergency Medicine

## 2022-03-09 NOTE — Telephone Encounter (Signed)
Received voicemail from patient looking for results of COVID test.  When this RN checked chart, noted it is not in process.  Called lab to f/u, they state they never received a swab.   ?Attempted to reach patient x 1 to review recollect. ?

## 2022-04-20 ENCOUNTER — Other Ambulatory Visit: Payer: Self-pay | Admitting: Family Medicine

## 2022-04-23 ENCOUNTER — Other Ambulatory Visit: Payer: Self-pay | Admitting: Family Medicine

## 2022-04-23 DIAGNOSIS — E2839 Other primary ovarian failure: Secondary | ICD-10-CM

## 2022-05-09 ENCOUNTER — Other Ambulatory Visit (HOSPITAL_COMMUNITY): Payer: Self-pay | Admitting: Family Medicine

## 2022-05-09 ENCOUNTER — Other Ambulatory Visit: Payer: Self-pay | Admitting: Family Medicine

## 2022-05-09 DIAGNOSIS — J438 Other emphysema: Secondary | ICD-10-CM

## 2022-05-30 ENCOUNTER — Encounter: Payer: Self-pay | Admitting: Infectious Diseases

## 2022-06-14 ENCOUNTER — Ambulatory Visit
Admission: RE | Admit: 2022-06-14 | Discharge: 2022-06-14 | Disposition: A | Discharge status: Home / Self Care | Payer: Medicare Other | Source: Ambulatory Visit | Attending: Family Medicine | Admitting: Family Medicine

## 2022-06-14 ENCOUNTER — Other Ambulatory Visit: Payer: Self-pay | Admitting: Family Medicine

## 2022-06-14 ENCOUNTER — Other Ambulatory Visit: Payer: Medicare Other

## 2022-06-14 DIAGNOSIS — Z13820 Encounter for screening for osteoporosis: Secondary | ICD-10-CM

## 2022-06-14 DIAGNOSIS — Z1231 Encounter for screening mammogram for malignant neoplasm of breast: Secondary | ICD-10-CM

## 2022-06-28 ENCOUNTER — Other Ambulatory Visit: Payer: Self-pay | Admitting: *Deleted

## 2022-06-28 DIAGNOSIS — Z122 Encounter for screening for malignant neoplasm of respiratory organs: Secondary | ICD-10-CM

## 2022-06-28 DIAGNOSIS — Z87891 Personal history of nicotine dependence: Secondary | ICD-10-CM

## 2022-06-30 NOTE — Progress Notes (Unsigned)
Cardiology Office Note:    Date:  07/02/2022   ID:  Tina Olson, DOB [birth year], MRN [id number]  PCP:  Tina Au, MD  Cardiologist:  None  Electrophysiologist:  None   Referring MD: Tina Au, MD   Chief Complaint  Patient presents with   Chest Pain    History of Present Illness:    Tina Olson is a 76 y.o. female with a hx of HIV, HCV, hypertension who is referred by Dr. Delia Olson for evaluation of shortness of breath, chest pain, and abnormal echocardiogram.  She reports that she has been having chest pain though improved recently.  Describes pressure on left side of chest, occurs about once per week or less.  Has not noted clear relationship with exertion but does not exercise.  Does report shortness of breath when walking up stairs but no chest pain.  Denies any lightheadedness or syncope.  She has been having palpitations about once per month, lasts up to 1 minute and feels like her heart is fluttering.  Reports he smoked for over 40 years, up to 0.5 packs/day but quit in 2008.  No known family history of heart disease.  Zio patch x2 weeks 05/04/2022 showed 37 episodes of SVT, longest lasting 17 beats with average rate 118 bpm, occasional PACs (1.4% of beats).  Echocardiogram 05/09/2022 showed EF 55 to 60%, mild LVH, normal diastolic function, normal RV function, no significant valvular disease.   Past Medical History:  Diagnosis Date   Acid reflux    Anemia 2017   Difficult intravenous access    Hepatitis C 07/2014   took harvani tx    History of blood transfusion 2017   2 units   HIV infection ([facility]) since 1993   Hypertension    Internal hemorrhoid    Right inguinal hernia     Past Surgical History:  Procedure Laterality Date   BREAST BIOPSY Right 07/26/2008   CATARACT EXTRACTION Right 2016   CATARACT EXTRACTION Left 06/2019   COLONOSCOPY WITH PROPOFOL N/A 06/11/2017   Procedure: COLONOSCOPY WITH PROPOFOL;  Surgeon: Tina Form, MD;   [facility];  Service: Endoscopy;  Laterality: N/A;  PT IS KNOWN HEART STICK. WILL PROBABLY REQUIRE ULTRASOUND OR ANESTHESIA FOR IV STICK.   ESOPHAGOGASTRODUODENOSCOPY (EGD) WITH PROPOFOL N/A 06/11/2017   Procedure: ESOPHAGOGASTRODUODENOSCOPY (EGD) WITH PROPOFOL;  Surgeon: Tina Form, MD;  [facility];  Service: Endoscopy;  Laterality: N/A;    Current Medications: Current Meds  Medication Sig   acetaminophen (TYLENOL) 325 MG tablet Take 325 mg by mouth daily as needed for moderate pain or headache.   amLODipine (NORVASC) 10 MG tablet Take 10 mg by mouth daily.   aspirin 81 MG chewable tablet Chew 1 tablet by mouth daily.   azaTHIOprine (IMURAN) 50 MG tablet Take 25 mg by mouth daily.   BIOTIN PO Take 500 mcg by mouth daily.   Calcium Carbonate-Vitamin D (CALTRATE 600+D PO) Take 1 tablet by mouth daily.   docusate sodium (COLACE) 100 MG capsule Take 100 mg by mouth as needed for mild constipation.   fluticasone (FLONASE) 50 MCG/ACT nasal spray Place 1 spray into both nostrils daily as needed for allergies or rhinitis.   Misc Natural Products (NEURIVA PO) Take 1 tablet by mouth daily.   Omega-3 Fatty Acids (FISH OIL) 1200 MG CAPS Take 1,200 mg by mouth daily.   omeprazole (PRILOSEC OTC) 20 MG tablet Take 20 mg by mouth daily.   polyethylene glycol (MIRALAX /  GLYCOLAX) packet Take 17 g by mouth daily as needed for mild constipation.    rosuvastatin (CRESTOR) 20 MG tablet Take 20 mg by mouth at bedtime.   valACYclovir (VALTREX) 1000 MG tablet Take 1,000 mg by mouth daily.    zinc gluconate 50 MG tablet Take 50 mg by mouth daily.     Allergies:   Lisinopril and Other   Social History   Socioeconomic History   Marital status: Widowed    Spouse name: Not on file   Number of children: 1   Years of education: Not on file   Highest education level: Not on file  Occupational History   Not on file  Tobacco Use   Smoking status: Former    Types: Cigarettes     Quit date: 12/10/2000    Years since quitting: 21.5   Smokeless tobacco: Never  Vaping Use   Vaping Use: Never used  Substance and Sexual Activity   Alcohol use: No    Alcohol/week: 0.0 standard drinks of alcohol   Drug use: No    Types: IV    Comment: clean since around 2000   Sexual activity: Not Currently    Comment: pt. declined condoms  Other Topics Concern   Not on file  Social History Narrative   Not on file   Social Determinants of Health   Financial Resource Strain: Not on file  Food Insecurity: Not on file  Transportation Needs: Not on file  Physical Activity: Not on file  Stress: Not on file  Social Connections: Not on file     Family History: The patient's family history includes Alzheimer's disease in her mother; Cancer in her father; Glaucoma in her mother; Hypertension in her mother; Seizures in her son.  ROS:   Please see the history of present illness.     All other systems reviewed and are negative.  EKGs/Labs/Other Studies Reviewed:    The following studies were reviewed today:   EKG:   07/02/22: sinus bradycardia, rate 59, less than 1 mm ST depressions in inferior leads and V4-6  Recent Labs: 02/15/2022: ALT 11; BUN 18; Creat 0.91; Potassium 4.1; Sodium 138  Recent Lipid Panel    Component Value Date/Time   CHOL 191 02/15/2022 0328   TRIG 104 02/15/2022 0328   HDL 58 02/15/2022 0328   CHOLHDL 3.3 02/15/2022 0328   VLDL 17 04/04/2017 1044   LDLCALC 112 (H) 02/15/2022 0328    Physical Exam:    VS:  BP 133/77   Pulse (!) 59   Ht 5' (1.524 m)   Wt 126 lb 9.6 oz (57.4 kg)   SpO2 100%   BMI 24.72 kg/m     Wt Readings from Last 3 Encounters:  07/02/22 126 lb 9.6 oz (57.4 kg)  02/15/22 123 lb (55.8 kg)  06/14/21 127 lb (57.6 kg)     GEN:  Well nourished, well developed in no acute distress HEENT: Normal NECK: No JVD; No carotid bruits LYMPHATICS: No lymphadenopathy CARDIAC: RRR, no murmurs, rubs, gallops RESPIRATORY:  Clear to  auscultation without rales, wheezing or rhonchi  ABDOMEN: Soft, non-tender, non-distended MUSCULOSKELETAL:  No edema; No deformity  SKIN: Warm and dry NEUROLOGIC:  Alert and oriented x 3 PSYCHIATRIC:  Normal affect   ASSESSMENT:    1. Chest pain of uncertain etiology   2. Pre-procedure lab exam   3. Palpitations   4. DOE (dyspnea on exertion)   5. Essential hypertension   6. Hyperlipidemia, unspecified hyperlipidemia type  PLAN:    Chest pain/DOE: Chest pain is atypical in description but also having dyspnea on exertion that could represent anginal equivalent.  Does have significant CAD risk factors (age, tobacco use, hypertension, hyperlipidemia).  Echocardiogram 05/09/2022 showed EF 55 to 60%, mild LVH, normal diastolic function, normal RV function, no significant valvular disease. -Recommend coronary CTA to evaluate for obstructive CAD.  Resting heart rate in 50s, will not give beta-blocker prior to study  Palpitations: Zio patch x2 weeks 05/04/2022 showed 37 episodes of SVT, longest lasting 17 beats with average rate 118 bpm, occasional PACs (1.4% of beats).  She reports palpitations only occurring about once per month.  Will hold off on starting beta-blocker given low resting heart rate and short duration of SVT on monitor and infrequent symptoms.  Hypertension: On amlodipine 10 mg daily.  Appears controlled  HIV: Follows with ID, CD4 count 388 02/15/2022  Hyperlipidemia: LDL 112 on 02/15/2022.  Follow-up results of coronary CTA to guide how aggressive to be in lowering cholesterol  RTC in 6 months  Medication Adjustments/Labs and Tests Ordered: Current medicines are reviewed at length with the patient today.  Concerns regarding medicines are outlined above.  Orders Placed This Encounter  Procedures   CT CORONARY MORPH W/CTA COR W/SCORE W/CA W/CM &/OR WO/CM   Basic metabolic panel   EKG 12-Lead   No orders of the defined types were placed in this encounter.   Patient  Instructions  Medication Instructions:  Your physician recommends that you continue on your current medications as directed. Please refer to the Current Medication list given to you today.  *If you need a refill on your cardiac medications before your next appointment, please call your pharmacy*  Lab Work: BMET today  If you have labs (blood work) drawn today and your tests are completely normal, you will receive your results only by: MyChart Message (if you have MyChart) OR A paper copy in the mail If you have any lab test that is abnormal or we need to change your treatment, we will call you to review the results.   Testing/Procedures: Coronary CTA-see instructions below  Follow-Up: At [facility], you and your health needs are our priority.  As part of our continuing mission to provide you with exceptional heart care, we have created designated Provider Care Teams.  These Care Teams include your primary Cardiologist (physician) and Advanced Practice Providers (APPs -  Physician Assistants and Nurse Practitioners) who all work together to provide you with the care you need, when you need it.  We recommend signing up for the patient portal called "MyChart".  Sign up information is provided on this After Visit Summary.  MyChart is used to connect with patients for Virtual Visits (Telemedicine).  Patients are able to view lab/test results, encounter notes, upcoming appointments, etc.  Non-urgent messages can be sent to your provider as well.   To learn more about what you can do with MyChart, go to [email or web address].    Your next appointment:   6 month(s)  The format for your next appointment:   In Person  Provider:   Dr. Bjorn Pippin  Other Instructions   Your cardiac CT will be scheduled at one of the below locations:   [facility] [address] [place], [place] [address] [phone]  If scheduled at [facility], please arrive at the [facility]  and Children's Entrance (Entrance C2) of [facility] 30 minutes prior to test start time. You can use the FREE  valet parking offered at entrance C (encouraged to control the heart rate for the test)  Proceed to the [facility] Radiology Department (first floor) to check-in and test prep.  All radiology patients and guests should use entrance C2 at [facility], accessed from [facility], even though the hospital's physical address listed is [address].    Please follow these instructions carefully (unless otherwise directed):   On the Night Before the Test: Be sure to Drink plenty of water. Do not consume any caffeinated/decaffeinated beverages or chocolate 12 hours prior to your test. Do not take any antihistamines 12 hours prior to your test.  On the Day of the Test: Drink plenty of water until 1 hour prior to the test. Do not eat any food 4 hours prior to the test. You may take your regular medications prior to the test.  FEMALES- please wear underwire-free bra if available, avoid dresses & tight clothing    After the Test: Drink plenty of water. After receiving IV contrast, you may experience a mild flushed feeling. This is normal. On occasion, you may experience a mild rash up to 24 hours after the test. This is not dangerous. If this occurs, you can take Benadryl 25 mg and increase your fluid intake. If you experience trouble breathing, this can be serious. If it is severe call 911 IMMEDIATELY. If it is mild, please call our office. If you take any of these medications: Glipizide/Metformin, Avandament, Glucavance, please do not take 48 hours after completing test unless otherwise instructed.  We will call to schedule your test 2-4 weeks out understanding that some insurance companies will need an authorization prior to the service being performed.   For non-scheduling related questions, please contact the cardiac imaging nurse navigator  should you have any questions/concerns: Rockwell Alexandria, Cardiac Imaging Nurse Navigator Larey Brick, Cardiac Imaging Nurse Navigator  Heart and Vascular Services Direct Office Dial: [phone]   For scheduling needs, including cancellations and rescheduling, please call [place], [phone].            Signed, Little Ishikawa, MD  07/02/2022 8:47 AM    [facility]

## 2022-07-02 ENCOUNTER — Encounter: Payer: Self-pay | Admitting: Cardiology

## 2022-07-02 ENCOUNTER — Ambulatory Visit (INDEPENDENT_AMBULATORY_CARE_PROVIDER_SITE_OTHER): Payer: Medicare Other | Admitting: Cardiology

## 2022-07-02 VITALS — BP 133/77 | HR 59 | Ht 60.0 in | Wt 126.6 lb

## 2022-07-02 DIAGNOSIS — R079 Chest pain, unspecified: Secondary | ICD-10-CM | POA: Diagnosis not present

## 2022-07-02 DIAGNOSIS — R0609 Other forms of dyspnea: Secondary | ICD-10-CM

## 2022-07-02 DIAGNOSIS — I10 Essential (primary) hypertension: Secondary | ICD-10-CM

## 2022-07-02 DIAGNOSIS — E785 Hyperlipidemia, unspecified: Secondary | ICD-10-CM

## 2022-07-02 DIAGNOSIS — Z01812 Encounter for preprocedural laboratory examination: Secondary | ICD-10-CM

## 2022-07-02 DIAGNOSIS — R002 Palpitations: Secondary | ICD-10-CM | POA: Diagnosis not present

## 2022-07-02 LAB — BASIC METABOLIC PANEL
BUN/Creatinine Ratio: 16 (ref 12–28)
BUN: 15 mg/dL (ref 8–27)
CO2: 20 mmol/L (ref 20–29)
Calcium: 9.8 mg/dL (ref 8.7–10.3)
Chloride: 102 mmol/L (ref 96–106)
Creatinine, Ser: 0.91 mg/dL (ref 0.57–1.00)
Glucose: 89 mg/dL (ref 70–99)
Potassium: 4.4 mmol/L (ref 3.5–5.2)
Sodium: 136 mmol/L (ref 134–144)
eGFR: 66 mL/min/{1.73_m2} (ref >59)

## 2022-07-02 NOTE — Patient Instructions (Signed)
Medication Instructions:  Your physician recommends that you continue on your current medications as directed. Please refer to the Current Medication list given to you today.  *If you need a refill on your cardiac medications before your next appointment, please call your pharmacy*  Lab Work: BMET today  If you have labs (blood work) drawn today and your tests are completely normal, you will receive your results only by: MyChart Message (if you have MyChart) OR A paper copy in the mail If you have any lab test that is abnormal or we need to change your treatment, we will call you to review the results.   Testing/Procedures: Coronary CTA-see instructions below  Follow-Up: At [facility], you and your health needs are our priority.  As part of our continuing mission to provide you with exceptional heart care, we have created designated Provider Care Teams.  These Care Teams include your primary Cardiologist (physician) and Advanced Practice Providers (APPs -  Physician Assistants and Nurse Practitioners) who all work together to provide you with the care you need, when you need it.  We recommend signing up for the patient portal called "MyChart".  Sign up information is provided on this After Visit Summary.  MyChart is used to connect with patients for Virtual Visits (Telemedicine).  Patients are able to view lab/test results, encounter notes, upcoming appointments, etc.  Non-urgent messages can be sent to your provider as well.   To learn more about what you can do with MyChart, go to [email or web address].    Your next appointment:   6 month(s)  The format for your next appointment:   In Person  Provider:   Dr. Bjorn Pippin  Other Instructions   Your cardiac CT will be scheduled at one of the below locations:   [facility] [address] [place], [place] [address] [phone]  If scheduled at [facility], please arrive at the [facility] and Children's  Entrance (Entrance C2) of [facility] 30 minutes prior to test start time. You can use the FREE valet parking offered at entrance C (encouraged to control the heart rate for the test)  Proceed to the [facility] Radiology Department (first floor) to check-in and test prep.  All radiology patients and guests should use entrance C2 at [facility], accessed from [facility], even though the hospital's physical address listed is [address]    Please follow these instructions carefully (unless otherwise directed):   On the Night Before the Test: Be sure to Drink plenty of water. Do not consume any caffeinated/decaffeinated beverages or chocolate 12 hours prior to your test. Do not take any antihistamines 12 hours prior to your test.  On the Day of the Test: Drink plenty of water until 1 hour prior to the test. Do not eat any food 4 hours prior to the test. You may take your regular medications prior to the test.  FEMALES- please wear underwire-free bra if available, avoid dresses & tight clothing    After the Test: Drink plenty of water. After receiving IV contrast, you may experience a mild flushed feeling. This is normal. On occasion, you may experience a mild rash up to 24 hours after the test. This is not dangerous. If this occurs, you can take Benadryl 25 mg and increase your fluid intake. If you experience trouble breathing, this can be serious. If it is severe call 911 IMMEDIATELY. If it is mild, please call our office. If you take any  of these medications: Glipizide/Metformin, Avandament, Glucavance, please do not take 48 hours after completing test unless otherwise instructed.  We will call to schedule your test 2-4 weeks out understanding that some insurance companies will need an authorization prior to the service being performed.   For non-scheduling related questions, please contact the cardiac imaging nurse navigator should you have any  questions/concerns: Rockwell Alexandria, Cardiac Imaging Nurse Navigator Larey Brick, Cardiac Imaging Nurse Navigator [facility] Direct Office Dial: [phone]   For scheduling needs, including cancellations and rescheduling, please call [place], [phone].

## 2022-07-04 ENCOUNTER — Encounter: Payer: Self-pay | Admitting: *Deleted

## 2022-07-18 ENCOUNTER — Ambulatory Visit (INDEPENDENT_AMBULATORY_CARE_PROVIDER_SITE_OTHER): Payer: Medicare Other | Admitting: Acute Care

## 2022-07-18 ENCOUNTER — Encounter: Payer: Self-pay | Admitting: Acute Care

## 2022-07-18 DIAGNOSIS — Z87891 Personal history of nicotine dependence: Secondary | ICD-10-CM

## 2022-07-18 NOTE — Patient Instructions (Signed)
Thank you for participating in the [place] Lung Cancer Screening Program. It was our pleasure to meet you today. We will call you with the results of your scan within the next few days. Your scan will be assigned a Lung RADS category score by the physicians reading the scans.  This Lung RADS score determines follow up scanning.  See below for description of categories, and follow up screening recommendations. We will be in touch to schedule your follow up screening annually or based on recommendations of our providers. We will fax a copy of your scan results to your Primary Care Physician, or the physician who referred you to the program, to ensure they have the results. Please call the office if you have any questions or concerns regarding your scanning experience or results.  Our office number is [phone]. Please speak with Denise Phelps, RN. , or  Denise Buckner RN, They are  our Lung Cancer Screening RN.'s If They are unavailable when you call, Please leave a message on the voice mail. We will return your call at our earliest convenience.This voice mail is monitored several times a day.  Remember, if your scan is normal, we will scan you annually as long as you continue to meet the criteria for the program. (Age 55-77, Current smoker or smoker who has quit within the last 15 years). If you are a smoker, remember, quitting is the single most powerful action that you can take to decrease your risk of lung cancer and other pulmonary, breathing related problems. We know quitting is hard, and we are here to help.  Please let us know if there is anything we can do to help you meet your goal of quitting. If you are a former smoker, congratulations. We are proud of you! Remain smoke free! Remember you can refer friends or family members through the number above.  We will screen them to make sure they meet criteria for the program. Thank you for helping us take better care of you by  participating in Lung Screening.  You can receive free nicotine replacement therapy ( patches, gum or mints) by calling 1-800-QUIT NOW. Please call so we can get you on the path to becoming  a non-smoker. I know it is hard, but you can do this!  Lung RADS Categories:  Lung RADS 1: no nodules or definitely non-concerning nodules.  Recommendation is for a repeat annual scan in 12 months.  Lung RADS 2:  nodules that are non-concerning in appearance and behavior with a very low likelihood of becoming an active cancer. Recommendation is for a repeat annual scan in 12 months.  Lung RADS 3: nodules that are probably non-concerning , includes nodules with a low likelihood of becoming an active cancer.  Recommendation is for a 6-month repeat screening scan. Often noted after an upper respiratory illness. We will be in touch to make sure you have no questions, and to schedule your 6-month scan.  Lung RADS 4 A: nodules with concerning findings, recommendation is most often for a follow up scan in 3 months or additional testing based on our provider's assessment of the scan. We will be in touch to make sure you have no questions and to schedule the recommended 3 month follow up scan.  Lung RADS 4 B:  indicates findings that are concerning. We will be in touch with you to schedule additional diagnostic testing based on our provider's  assessment of the scan.  Other options for assistance in smoking cessation (   As covered by your insurance benefits)  Hypnosis for smoking cessation  [employer] [phone]  Acupuncture for smoking cessation  [facility] [phone]   

## 2022-07-18 NOTE — Progress Notes (Signed)
Virtual Visit via Telephone Note  I connected with Tina Olson on 07/18/22 at 10:00 AM EDT by telephone and verified that I am speaking with the correct person using two identifiers.  Location: Patient:  At home Provider:  [address], [place], [place], [address]    I discussed the limitations, risks, security and privacy concerns of performing an evaluation and management service by telephone and the availability of in person appointments. I also discussed with the patient that there may be a patient responsible charge related to this service. The patient expressed understanding and agreed to proceed.   Shared Decision Making Visit Lung Cancer Screening Program [phone])   Eligibility: Age 76 y.o. Pack Years Smoking History Calculation 32 pack year smoking history (# packs/per year x # years smoked) Recent History of coughing up blood  no Unexplained weight loss? no ( >Than 15 pounds within the last 6 months ) Prior History Lung / other cancer no (Diagnosis within the last 5 years already requiring surveillance chest CT Scans). Smoking Status Former Smoker Former Smokers: Years since quit: 13 years  Quit Date: 2010  Visit Components: Discussion included one or more decision making aids. yes Discussion included risk/benefits of screening. yes Discussion included potential follow up diagnostic testing for abnormal scans. yes Discussion included meaning and risk of over diagnosis. yes Discussion included meaning and risk of False Positives. yes Discussion included meaning of total radiation exposure. yes  Counseling Included: Importance of adherence to annual lung cancer LDCT screening. yes Impact of comorbidities on ability to participate in the program. yes Ability and willingness to under diagnostic treatment. yes  Smoking Cessation Counseling: Current Smokers:  Discussed importance of smoking cessation. yes Information about tobacco cessation classes and  interventions provided to patient. yes Patient provided with "ticket" for LDCT Scan. yes Symptomatic Patient. no  Counseling NA Diagnosis Code: Tobacco Use Z72.0 Asymptomatic Patient yes  Counseling (Intermediate counseling: > three minutes counseling) [id number] Former Smokers:  Discussed the importance of maintaining cigarette abstinence. yes Diagnosis Code: Personal History of Nicotine Dependence. G18.299 Information about tobacco cessation classes and interventions provided to patient. Yes Patient provided with "ticket" for LDCT Scan. yes Written Order for Lung Cancer Screening with LDCT placed in Epic. Yes (CT Chest Lung Cancer Screening Low Dose W/O CM) [id number] Z12.2-Screening of respiratory organs Z87.891-Personal history of nicotine dependence  I spent 25 minutes of face to face time/virtual visit time  with  Ms. Donnelly discussing the risks and benefits of lung cancer screening. We took the time to pause the power point at intervals to allow for questions to be asked and answered to ensure understanding. We discussed that she had taken the single most powerful action possible to decrease her risk of developing lung cancer when she quit smoking. I counseled her to remain smoke free, and to contact me if she ever had the desire to smoke again so that I can provide resources and tools to help support the effort to remain smoke free. We discussed the time and location of the scan, and that either  Abigail Miyamoto RN, Karlton Lemon, RN or I  or I will call / send a letter with the results within  24-72 hours of receiving them. She has the office contact information in the event she needs to speak with me,  she verbalized understanding of all of the above and had no further questions upon leaving the office.     I explained to the patient that there has  been a high incidence of coronary artery disease noted on these exams. I explained that this is a non-gated exam therefore degree or severity cannot  be determined. This patient is on statin therapy. I have asked the patient to follow-up with their PCP regarding any incidental finding of coronary artery disease and management with diet or medication as they feel is clinically indicated. The patient verbalized understanding of the above and had no further questions.     Bevelyn Ngo, NP 07/18/2022

## 2022-07-19 ENCOUNTER — Ambulatory Visit
Admission: RE | Admit: 2022-07-19 | Discharge: 2022-07-19 | Disposition: A | Discharge status: Home / Self Care | Payer: Medicare Other | Source: Ambulatory Visit | Attending: Acute Care | Admitting: Acute Care

## 2022-07-19 DIAGNOSIS — Z122 Encounter for screening for malignant neoplasm of respiratory organs: Secondary | ICD-10-CM

## 2022-07-19 DIAGNOSIS — Z87891 Personal history of nicotine dependence: Secondary | ICD-10-CM

## 2022-07-20 ENCOUNTER — Telehealth (HOSPITAL_COMMUNITY): Payer: Self-pay | Admitting: Emergency Medicine

## 2022-07-20 NOTE — Telephone Encounter (Signed)
Attempted to call patient regarding upcoming cardiac CT appointment. °Left message on voicemail with name and callback number °Dareon Nunziato RN Navigator Cardiac Imaging °[facility] °[phone] Office °[phone] Cell ° °

## 2022-07-23 ENCOUNTER — Ambulatory Visit (HOSPITAL_COMMUNITY)
Admission: RE | Admit: 2022-07-23 | Discharge: 2022-07-23 | Disposition: A | Discharge status: Home / Self Care | Payer: Medicare Other | Source: Ambulatory Visit | Attending: Cardiology | Admitting: Cardiology

## 2022-07-23 ENCOUNTER — Ambulatory Visit (HOSPITAL_BASED_OUTPATIENT_CLINIC_OR_DEPARTMENT_OTHER)
Admission: RE | Admit: 2022-07-23 | Discharge: 2022-07-23 | Disposition: A | Discharge status: Home / Self Care | Payer: Medicare Other | Source: Ambulatory Visit | Attending: Cardiovascular Disease | Admitting: Cardiovascular Disease

## 2022-07-23 ENCOUNTER — Ambulatory Visit (HOSPITAL_COMMUNITY)
Admission: RE | Admit: 2022-07-23 | Discharge: 2022-07-23 | Disposition: A | Discharge status: Home / Self Care | Payer: Medicare Other | Source: Ambulatory Visit | Attending: Cardiovascular Disease | Admitting: Cardiovascular Disease

## 2022-07-23 ENCOUNTER — Other Ambulatory Visit: Payer: Self-pay

## 2022-07-23 ENCOUNTER — Other Ambulatory Visit: Payer: Self-pay | Admitting: Cardiovascular Disease

## 2022-07-23 DIAGNOSIS — R079 Chest pain, unspecified: Secondary | ICD-10-CM | POA: Insufficient documentation

## 2022-07-23 DIAGNOSIS — R931 Abnormal findings on diagnostic imaging of heart and coronary circulation: Secondary | ICD-10-CM | POA: Insufficient documentation

## 2022-07-23 DIAGNOSIS — I2510 Atherosclerotic heart disease of native coronary artery without angina pectoris: Secondary | ICD-10-CM | POA: Insufficient documentation

## 2022-07-23 DIAGNOSIS — I700 Atherosclerosis of aorta: Secondary | ICD-10-CM | POA: Diagnosis not present

## 2022-07-23 DIAGNOSIS — J439 Emphysema, unspecified: Secondary | ICD-10-CM | POA: Insufficient documentation

## 2022-07-23 DIAGNOSIS — Z122 Encounter for screening for malignant neoplasm of respiratory organs: Secondary | ICD-10-CM

## 2022-07-23 DIAGNOSIS — Z87891 Personal history of nicotine dependence: Secondary | ICD-10-CM

## 2022-07-23 MED ORDER — DEXAMETHASONE SODIUM PHOSPHATE 10 MG/ML IJ SOLN: 12.0000 mg | Freq: Once | INTRAMUSCULAR | Status: DC

## 2022-07-23 MED ORDER — NITROGLYCERIN 0.4 MG SL SUBL
SUBLINGUAL_TABLET | SUBLINGUAL | Status: AC
  Filled 2022-07-23: qty 2

## 2022-07-23 MED ORDER — DEXAMETHASONE SODIUM PHOSPHATE 10 MG/ML IJ SOLN
10.0000 mg | Freq: Once | INTRAMUSCULAR | Status: AC
Start: 2022-07-23 — End: 2022-07-23

## 2022-07-23 MED ORDER — DEXAMETHASONE SODIUM PHOSPHATE 10 MG/ML IJ SOLN
INTRAMUSCULAR | Status: AC
  Administered 2022-07-23: 10 mg via INTRAMUSCULAR
  Filled 2022-07-23: qty 1

## 2022-07-23 MED ORDER — NITROGLYCERIN 0.4 MG SL SUBL
0.8000 mg | SUBLINGUAL_TABLET | Freq: Once | SUBLINGUAL | Status: AC
  Administered 2022-07-23: 0.8 mg via SUBLINGUAL

## 2022-07-23 MED ORDER — IOHEXOL 350 MG/ML SOLN
100.0000 mL | Freq: Once | INTRAVENOUS | Status: AC | PRN
  Administered 2022-07-23: 100 mL via INTRAVENOUS

## 2022-07-23 NOTE — Progress Notes (Signed)
Pt states that the "itching is much better, I just want to go home." VSS.

## 2022-07-23 NOTE — Progress Notes (Signed)
Pt tolerated CT scan well with no complaints. After removing IV, pt states that she is itching in her arms and her legs. Brought pt back to radiology nurses station where she was assessed by radiology PA. Pt noted to have hives on her left tricep and right thigh. Hives are red and raised. Pt states that they itch badly. Pt denies shortness of breath or trouble swallowing. Pt states that benadryl "knocks me out" so she does not take it. Pt states that she traveled to the hospital via transportation service provided through [employer].  PA at bedside, awaiting orders

## 2022-07-23 NOTE — Progress Notes (Signed)
  Called by RN to evaluate patient for itching after contrast enhanced CT scan.  Patient has 3 hives, one on the back of each arm and one on her right lateral thigh.  No other hives or rash seen.  No SOB, no tongue or throat swelling.  Recommended Benadryl tablet, however patient declines because she states "it knocks me out".  Will give 12 mg Decadron IM now.  Currently monitoring patient for an additional 30 minutes.  Will add IV Contrast to patient's allergies. She understands she will need pre-medication prior to any future contrasted studies.  Clenton Esper S Tierre Netto PA-C 07/23/2022 11:25 AM

## 2022-07-23 NOTE — Progress Notes (Signed)
Medication given per order. Pt resting in radiology nurses station at this time, VSS. Only complaint is itching.

## 2022-07-26 ENCOUNTER — Other Ambulatory Visit: Payer: Self-pay | Admitting: *Deleted

## 2022-07-26 DIAGNOSIS — I2510 Atherosclerotic heart disease of native coronary artery without angina pectoris: Secondary | ICD-10-CM

## 2022-07-26 DIAGNOSIS — J849 Interstitial pulmonary disease, unspecified: Secondary | ICD-10-CM

## 2022-07-26 DIAGNOSIS — E785 Hyperlipidemia, unspecified: Secondary | ICD-10-CM

## 2022-07-30 ENCOUNTER — Other Ambulatory Visit: Payer: Self-pay

## 2022-07-30 DIAGNOSIS — E785 Hyperlipidemia, unspecified: Secondary | ICD-10-CM

## 2022-07-30 DIAGNOSIS — I2510 Atherosclerotic heart disease of native coronary artery without angina pectoris: Secondary | ICD-10-CM

## 2022-07-30 LAB — LIPID PANEL
Chol/HDL Ratio: 2.1 ratio (ref 0.0–4.4)
Cholesterol, Total: 115 mg/dL (ref 100–199)
HDL: 56 mg/dL (ref >39)
LDL Chol Calc (NIH): 46 mg/dL (ref 0–99)
Triglycerides: 61 mg/dL (ref 0–149)
VLDL Cholesterol Cal: 13 mg/dL (ref 5–40)

## 2022-08-01 ENCOUNTER — Encounter: Payer: Self-pay | Admitting: *Deleted

## 2022-08-16 ENCOUNTER — Ambulatory Visit (INDEPENDENT_AMBULATORY_CARE_PROVIDER_SITE_OTHER): Payer: Medicare Other | Admitting: Internal Medicine

## 2022-08-16 ENCOUNTER — Encounter: Payer: Self-pay | Admitting: Internal Medicine

## 2022-08-16 VITALS — BP 126/62 | HR 71 | Ht 60.0 in | Wt 125.0 lb

## 2022-08-16 DIAGNOSIS — Z836 Family history of other diseases of the respiratory system: Secondary | ICD-10-CM

## 2022-08-16 DIAGNOSIS — J849 Interstitial pulmonary disease, unspecified: Secondary | ICD-10-CM

## 2022-08-16 DIAGNOSIS — D849 Immunodeficiency, unspecified: Secondary | ICD-10-CM

## 2022-08-16 LAB — HEPATIC FUNCTION PANEL
ALT: 14 U/L (ref 0–35)
AST: 22 U/L (ref 0–37)
Albumin: 4.3 g/dL (ref 3.5–5.2)
Alkaline Phosphatase: 39 U/L (ref 39–117)
Bilirubin, Direct: 0.1 mg/dL (ref 0.0–0.3)
Total Bilirubin: 0.6 mg/dL (ref 0.2–1.2)
Total Protein: 9.1 g/dL — ABNORMAL HIGH (ref 6.0–8.3)

## 2022-08-16 LAB — SEDIMENTATION RATE: Sed Rate: 130 mm/hr — ABNORMAL HIGH (ref 0–30)

## 2022-08-16 NOTE — Progress Notes (Signed)
OV 08/16/2022  Subjective:  Patient ID: Tina Olson, female , DOB: Feb 13, 1946 , age 76 y.o. , MRN: [id number] , ADDRESS: [place] [id number] PCP Loura Pardon, MD Patient Care Team: Loura Pardon, MD as PCP - General (Family Medicine) Campbell Riches, MD as PCP - Infectious Diseases (Infectious Diseases)  This Provider for this visit: Treatment Team:  Attending Provider: Brand Males, MD    08/16/2022 -   Chief Complaint  Patient presents with   Consult    Pt is here due to recent CT coronary results.  Pt states that she does have some SOB especially if she goes to the mailbox to bend down to pick something up.     HPI Tina Olson 76 y.o. -76 y.o. 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 at. Patient has hx of pseudophakia both eyes s/p PCIOL OU with Dr. Katy Fitch and inflammation in the left eye ever since cataract surgery on 06/22/19. She has hx of HIV since 1993 and hx of Syphilis with treatment over 10 year ago. Currently on Valtrex 1g daily and Azathioprine 50 mg daily. Sees Dr Tanja Port t [place]   Has HIV Hx of HIV since 1993 and hx of Syphilis over 10 years ago  -sees Dr Johnnye Sima  She is a former smoker who undergoes lung cancer screening as well  She actually has interstitial lung disease mentioned on CT scans in 2020 but she has not been aware of this until she had a cardiac CT in 2023 August.  Therefore she has been referred here.  She tells me that she has had insidious onset of shortness of breath for at least a year and slowly getting worse.  Made worse with exertion and stooping forward.  Improved by rest but there is also variability to it and sometimes it is not there.   Integrated Comprehensive ILD Questionnaire  Symptoms:   SYMPTOM SCALE - ILD 08/16/2022  Current weight   O2 use ra  Shortness of Breath 0 -> 5 scale with 5 being worst (score 6 If unable to do)  At  rest 0  Simple tasks - showers, clothes change, eating, shaving 0  Household (dishes, doing bed, laundry) 3  Shopping 2  Walking level at own pace 2  Walking up Stairs 3  Total (30-36) Dyspnea Score 10      Non-dyspnea symptoms (0-> 5 scale) 08/16/2022  How bad is your cough? 0  How bad is your fatigue 2  How bad is nausea 0  How bad is vomiting?  0  How bad is diarrhea? 0  How bad is anxiety? 1  How bad is depression 2  Any chronic pain - if so where and how bad 0     Past Medical History :  Is HIV positive and follows with Dr. Johnnye Sima Has ophthalmologic issues and is immunosuppressed and is on Imuran for at least a few years.   Positive for longstanding history of acid reflux disease  Denies any history of asthma but somebody apparently told her she has COPD but she is not sure Denies any connective tissue disease or vasculitis Denies any diabetes or stroke or hepatitis or tuberculosis. Denies any kidney disease She has had the COVID-vaccine but has not had COVID.  ROS:  Positive for shortness of breath with exertion.  Not sure she has cough  FAMILY HISTORY of LUNG DISEASE:  -Her biologic second cousin [facility]  has IPF and follows with Dr. Vaughan Browner. With family history of asthma  PERSONAL EXPOSURE HISTORY:  She used to smoke at least 20 cigarettes a day.  She quit smoking sometime between 2018 2010.  No marijuana smoking no vaping no cocaine use.  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  She did use it quite a lot  HOME  EXPOSURE and HOBBY DETAILS :  [place] the last 11 years.  Detail organic antigen exposure history in the house is negative  OCCUPATIONAL HISTORY (122 questions) : She grew up in [place] in her childhood although she was born in [place].  Then she moved to [place] for high school and she is lived here since.  She used to work in [employer] and retired in 2010.  During this time  was exposed to steam iron and cleaning equipment but no known organic or inorganic antigen exposure.  PULMONARY TOXICITY HISTORY (27 items):  -Is on Imuran for ophthalmology issues.  INVESTIGATIONS:    CT Chest data  No results found.    PFT      No data to display          Latest Reference Range & Units 04/27/08 04:20 08/12/08 23:33 04/18/09 22:55 01/15/10 21:19 01/15/10 21:45 06/05/10 18:34 09/22/10 21:14 04/18/11 11:43 09/24/11 10:20 02/04/12 12:01 06/26/12 13:05 07/07/12 12:56 09/03/12 09:37 03/18/13 11:31 09/30/13 10:40 03/31/14 11:09 08/02/14 10:59 10/04/14 14:28 06/27/15 14:24 01/16/16 14:01 10/23/16 15:46 01/26/17 01:33 01/26/17 18:28 01/27/17 02:54 04/01/17 11:54 04/04/17 10:44 05/14/17 09:36 10/08/17 13:44 11/20/17 11:51 05/28/18 11:13 05/29/18 19:50 03/03/19 09:35 12/23/19 10:05 04/04/20 11:46 06/15/20 10:01 05/18/21 16:12  Hemoglobin 11.7 - 15.5 g/dL 12.1 11.6 (L) 12.0 12.9 13.6 11.9 (L) 12.4 12.2 12.0 12.3 12.2 14.8 12.7 12.8 11.8 (L) 11.7 (L) 12.5 11.5 (L) 10.8 (L) 10.5 (L) 9.4 (L) 7.7 (L) 11.5 (L) 11.6 (L) 11.1 (L) 11.6 (L) 11.4 (L) 11.9 (L) 11.2 (L) 10.3 (L) 11.2 (L) 11.1 (L) 10.6 (L) 10.1 (L) 9.4 (L) 10.3 (L)  (L): Data is abnormally low  Latest Reference Range & Units 04/27/08 04:20 08/12/08 23:33 04/18/09 22:55 01/15/10 21:45 06/05/10 18:34 09/22/10 21:14 04/18/11 11:43 09/24/11 10:20 02/04/12 12:01 06/26/12 13:05 07/07/12 12:56 09/03/12 09:37 03/18/13 11:31 09/30/13 10:40 03/31/14 11:09 08/02/14 10:59 08/16/14 13:10 10/04/14 14:21 06/27/15 14:24 01/16/16 14:01 01/18/16 11:30 10/23/16 15:46 01/26/17 01:33 01/27/17 02:54 04/04/17 10:44 10/08/17 13:44 11/20/17 11:51 05/28/18 11:13 05/29/18 19:50 06/02/18 12:15 03/03/19 09:35 03/05/19 09:57 12/23/19 10:05 04/04/20 11:46 06/15/20 10:01 11/24/20 00:00 05/18/21 16:12 02/15/22 03:28 07/02/22 09:34  Creatinine 0.57 - 1.00 mg/dL 0.91 0.86 0.88 1.0 1.04 1.02 0.91 0.92 0.96 1.17 (H) 0.93 1.09 0.90 0.84 0.84 1.15 (H) 0.94 1.01 1.04  1.01 (H) 1.16 (H) 1.11 (H) 1.30 (H) 1.00 1.00 (H) 1.10 (H) 1.14 (H) 1.04 (H) 1.00 0.90 1.06 (H) 1.02 (H) 0.97 (H) 0.91 0.99 (H) 0.93 1.15 (H) 0.91 0.91  (H): Data is abnormally high  Latest Reference Range & Units 04/27/08 04:20 08/12/08 23:33 04/18/09 22:55 01/15/10 22:43 06/05/10 18:34 09/22/10 21:14 04/18/11 11:43 09/24/11 10:20 02/04/12 12:01 06/26/12 13:05 07/07/12 12:56 09/03/12 09:37 03/18/13 11:31 09/30/13 10:40 03/31/14 11:09 08/02/14 10:59 08/16/14 13:10 10/04/14 14:21 06/27/15 14:24 01/16/16 14:01 10/23/16 15:46 01/27/17 02:54 04/04/17 10:44 10/08/17 13:44 11/20/17 11:51 05/28/18 11:13 03/03/19 09:35 12/23/19 10:05 06/15/20 10:01 11/24/20 00:00 05/18/21 16:12 02/15/22 03:28  AST 10 - 35 U/L 32 33 36 72 (H) 40 (H) 35 34 31 37 33 42 (  H) 33 35 38 (H) _0 37 (H) _1 ALT 6 - 29 U/L 34 34 35 40 (H) 34 _2 32 27 27 33 _3 (L) 9 11 (L) _4 (H): Data is abnormally high (L): Data is abnormally low   CT chest July 2021 -personally visualized.  Narrative & Impression  CLINICAL DATA:  75 year old asymptomatic female former smoker presents for follow-up of pulmonary nodules.   EXAM: CT CHEST WITHOUT CONTRAST   TECHNIQUE: Multidetector CT imaging of the chest was performed following the standard protocol without intravenous contrast. High resolution imaging of the lungs, as well as inspiratory and expiratory imaging, was performed.   COMPARISON:  11/19/2019 CT abdomen/pelvis. 04/04/2020 chest radiograph.   FINDINGS: Cardiovascular: Normal heart size. No significant pericardial effusion/thickening. Three-vessel coronary atherosclerosis. Atherosclerotic nonaneurysmal thoracic aorta. Top-normal caliber main pulmonary artery (3.1 cm diameter).   Mediastinum/Nodes: No discrete thyroid nodules. Unremarkable esophagus. No pathologically enlarged axillary, mediastinal or hilar lymph nodes, noting limited  sensitivity for the detection of hilar adenopathy on this noncontrast study.   Lungs/Pleura: No pneumothorax. No pleural effusion. Moderate centrilobular and paraseptal emphysema. No acute consolidative airspace disease or lung masses. Previously visualized clustered nodular opacities in the peripheral basilar right lower lobe on 12/08/2019 CT abdomen study have resolved. No significant pulmonary nodules. No significant lobular air trapping or evidence of tracheobronchomalacia on the expiration sequence. Mild patchy subpleural reticulation and ground-glass opacity with subpleural lines in the lower lungs bilaterally without significant regions of traction bronchiectasis, architectural distortion or frank honeycombing. These findings of not substantially changed at the lung bases since 12/08/2019 CT abdomen study.   Upper abdomen: No acute abnormality.   Musculoskeletal: No aggressive appearing focal osseous lesions. Mild thoracic spondylosis.   IMPRESSION: 1. No significant pulmonary nodules. Previously visualized clustered nodular opacities in the peripheral basilar right lower lobe on 12/08/2019 CT abdomen study have resolved. 2. Mild patchy subpleural reticulation and ground-glass opacity with subpleural lines in the lower lungs bilaterally without bronchiectasis or honeycombing, unchanged since 12/08/2019 CT abdomen study. Mild interstitial lung disease such as NSIP or early UIP not excluded. Findings are indeterminate for UIP per consensus guidelines: Diagnosis of Idiopathic Pulmonary Fibrosis: An Official ATS/ERS/JRS/ALAT Clinical Practice Guideline. [place], Iss 5, [phone], Aug 10 2017. 3. Three-vessel coronary atherosclerosis. 4. Aortic Atherosclerosis (ICD10-I70.0) and Emphysema (ICD10-J43.9).     Electronically Signed   By: Ilona Sorrel M.D.   On: 06/16/2020 11:54    has a past medical history of Acid reflux, Anemia (2017), Difficult  intravenous access, Hepatitis C (07/2014), History of blood transfusion (2017), HIV infection ([place]) (since 1993), Hypertension, Internal hemorrhoid, and Right inguinal hernia.   reports that she quit smoking about 13 years ago. Her smoking use included cigarettes. She has a 33.75 pack-year smoking history. She has never used smokeless tobacco.  Past Surgical History:  Procedure Laterality Date   BREAST BIOPSY Right 07/26/2008   CATARACT EXTRACTION Right 2016   CATARACT EXTRACTION Left 06/2019   COLONOSCOPY WITH PROPOFOL N/A 06/11/2017   Procedure: COLONOSCOPY WITH PROPOFOL;  Surgeon: Mauri Pole, MD;  Location: [facility];  Service: Endoscopy;  Laterality: N/A;  PT IS KNOWN HEART STICK. WILL PROBABLY REQUIRE ULTRASOUND OR ANESTHESIA FOR IV STICK.   ESOPHAGOGASTRODUODENOSCOPY (EGD) WITH PROPOFOL N/A 06/11/2017  Procedure: ESOPHAGOGASTRODUODENOSCOPY (EGD) WITH PROPOFOL;  Surgeon: Mauri Pole, MD;  Location: [facility];  Service: Endoscopy;  Laterality: N/A;    Allergies  Allergen Reactions   Iodinated Contrast Media Hives and Itching   Lisinopril Itching and Cough   Other Hives and Itching     Seafood crustaceans  "itchy throat"    Immunization History  Administered Date(s) Administered   Fluad Quad(high Dose 65+) 01/07/2020, 02/15/2022   Hepatitis A 09/22/2010, 05/02/2011   Hepatitis B 05/10/2008, 06/09/2008, 10/11/2008   Hepatitis B, adult 10/14/2013, 11/13/2013, 05/17/2014   Influenza Split 10/08/2011, 09/18/2012   Influenza Whole 10/11/2008, 10/20/2009, 09/22/2010, 10/25/2014   Influenza,inj,Quad PF,6+ Mos 08/03/2013, 11/04/2017, 09/24/2018   Influenza-Unspecified 10/24/2015, 10/17/2016, 11/24/2020   PFIZER(Purple Top)SARS-COV-2 Vaccination 02/05/2020, 03/02/2020, 11/01/2020   Pneumococcal Conjugate-13 09/24/2018   Pneumococcal Polysaccharide-23 09/09/2008, 04/01/2013, 01/07/2020    Family History  Problem Relation Age of Onset   Alzheimer's disease Mother     Glaucoma Mother    Hypertension Mother    Cancer Father        ? type   Seizures Son      Current Outpatient Medications:    acetaminophen (TYLENOL) 325 MG tablet, Take 325 mg by mouth daily as needed for moderate pain or headache., Disp: , Rfl:    amLODipine (NORVASC) 10 MG tablet, Take 10 mg by mouth daily., Disp: , Rfl:    aspirin 81 MG chewable tablet, Chew 1 tablet by mouth daily., Disp: , Rfl:    azaTHIOprine (IMURAN) 50 MG tablet, Take 25 mg by mouth daily., Disp: , Rfl:    BIOTIN PO, Take 500 mcg by mouth daily., Disp: , Rfl:    calcium carbonate (OSCAL) 1500 (600 Ca) MG TABS tablet, Take 1,500 mg by mouth daily with breakfast., Disp: , Rfl:    docusate sodium (COLACE) 100 MG capsule, Take 100 mg by mouth as needed for mild constipation., Disp: , Rfl:    fluticasone (FLONASE) 50 MCG/ACT nasal spray, Place 1 spray into both nostrils daily as needed for allergies or rhinitis., Disp: , Rfl:    Omega-3 Fatty Acids (FISH OIL) 1200 MG CAPS, Take 1,200 mg by mouth daily., Disp: , Rfl:    polyethylene glycol (MIRALAX / GLYCOLAX) packet, Take 17 g by mouth daily as needed for mild constipation. , Disp: , Rfl:    rosuvastatin (CRESTOR) 20 MG tablet, Take 20 mg by mouth at bedtime., Disp: , Rfl:    valACYclovir (VALTREX) 1000 MG tablet, Take 1,000 mg by mouth daily. , Disp: , Rfl:       Objective:   Vitals:   08/16/22 0836  BP: 126/62  Pulse: 71  SpO2: 100%  Weight: 125 lb (56.7 kg)  Height: 5' (1.524 m)    Estimated body mass index is 24.41 kg/m as calculated from the following:   Height as of this encounter: 5' (1.524 m).   Weight as of this encounter: 125 lb (56.7 kg).  _0 @  [facility] Weights   08/16/22 0836  Weight: 125 lb (56.7 kg)     Physical Exam   General: No distress. pleasant Neuro: Alert and Oriented x 3. GCS 15. Speech normal Psych: Pleasant Resp:  Barrel Chest - no.  Wheeze - no, Crackles - yes mild at base, No overt respiratory distress CVS:  Normal heart sounds. Murmurs - no Ext: Stigmata of Connective Tissue Disease - no HEENT: Normal upper airway. PEERL +. No post nasal drip        Assessment:  ICD-10-CM   1. ILD (interstitial lung disease) (HCC)  J84.9 Hepatic function panel    QuantiFERON-TB Gold Plus    Sedimentation rate    ANA    Hypersensitivity Pneumonitis    Rheumatoid factor    Cyclic citrul peptide antibody, IgG    CK total and CKMB (cardiac)not at [facility]    Aldolase    Sjogren's syndrome antibods(ssa + ssb)    Anti-scleroderma antibody    Anti-DNA antibody, double-stranded    CT Chest High Resolution    Pulmonary function test    Pulse oximetry, overnight    2. Immunosuppressed status ([place])  D84.9     3. Family history of pulmonary fibrosis  Z83.6          Plan:     Patient Instructions     ICD-10-CM   1. ILD (interstitial lung disease) ([place])  J84.9     2. Immunosuppressed status ([place])  D84.9     3. Family history of pulmonary fibrosis  Z83.6        -You  have Interstitial Lung Disease (ILD) atleast snice 2020 but we are realizing that only now  - Might be getting worse; last HRCT July 2021  -  There are MANY varieties of this  PLAN - To narrow down possibilities and assess severity please do the following tests  - do full PFT  - do walking test on room air in the office (not 6 min walk test); can be next visit  - do overnight oxygen test on room air  - do High Resolution CT chest wo contrast - supine and prone, inspiratory and expiratory images   - do autoimmune panel: Serum: Quantiferon gold, ESR, ANA, DS-DNA, RF, anti-CCP, ssA, ssB, scl-70,  Total CK,  Aldolase,  Hypersensitivity Pneumonitis Panel, LFT - genetic testing at some point - nice meeting you and cousin Judye Bos - next few to seeral weeks with Dr Chase Caller to discuss test results and next step    SIGNATURE    Dr. Brand Males, M.D., F.C.C.P,  Pulmonary and Critical Care Medicine Staff  Physician, [place] Director - Interstitial Lung Disease  Program  [facility] [place] [facility] [place], [place], [address]  Pager: [phone], If no answer or between  15:00h - 7:00h: call [phone] Telephone: [phone]  9:39 AM 08/16/2022

## 2022-08-16 NOTE — Patient Instructions (Addendum)
ICD-10-CM   1. ILD (interstitial lung disease) ([place])  J84.9     2. Immunosuppressed status ([place])  D84.9     3. Family history of pulmonary fibrosis  Z83.6        -You  have Interstitial Lung Disease (ILD) atleast snice 2020 but we are realizing that only now  - Might be getting worse; last HRCT July 2021  -  There are MANY varieties of this  PLAN - To narrow down possibilities and assess severity please do the following tests  - do full PFT  - do walking test on room air in the office (not 6 min walk test); can be next visit  - do overnight oxygen test on room air  - do High Resolution CT chest wo contrast - supine and prone, inspiratory and expiratory images   - do autoimmune panel: Serum: Quantiferon gold, ESR, ANA, DS-DNA, RF, anti-CCP, ssA, ssB, scl-70,  Total CK,  Aldolase,  Hypersensitivity Pneumonitis Panel, LFT - genetic testing at some point - nice meeting you and cousin Judye Bos - next few to seeral weeks with Dr Chase Caller to discuss test results and next step

## 2022-08-18 LAB — QUANTIFERON-TB GOLD PLUS
Mitogen-NIL: 3.52 IU/mL
NIL: 0.02 IU/mL
QuantiFERON-TB Gold Plus: NEGATIVE
TB1-NIL: 0.07 IU/mL
TB2-NIL: 0.11 IU/mL

## 2022-08-20 LAB — HYPERSENSITIVITY PNEUMONITIS
A. Pullulans Abs: NEGATIVE
A.Fumigatus #1 Abs: NEGATIVE
Micropolyspora faeni, IgG: NEGATIVE
Pigeon Serum Abs: NEGATIVE
Thermoact. Saccharii: NEGATIVE
Thermoactinomyces vulgaris, IgG: NEGATIVE

## 2022-08-20 LAB — SJOGREN'S SYNDROME ANTIBODS(SSA + SSB)
SSA (Ro) (ENA) Antibody, IgG: <1 AI
SSB (La) (ENA) Antibody, IgG: <1 AI

## 2022-08-20 LAB — ANA: Anti Nuclear Antibody (ANA): POSITIVE — AB

## 2022-08-20 LAB — CYCLIC CITRUL PEPTIDE ANTIBODY, IGG: Cyclic Citrullin Peptide Ab: <16 UNITS

## 2022-08-20 LAB — ANTI-DNA ANTIBODY, DOUBLE-STRANDED: ds DNA Ab: 1 IU/mL

## 2022-08-20 LAB — ANTI-SCLERODERMA ANTIBODY: Scleroderma (Scl-70) (ENA) Antibody, IgG: 1.5 AI — AB

## 2022-08-20 LAB — ANTI-NUCLEAR AB-TITER (ANA TITER): ANA Titer 1: 1:80 {titer} — ABNORMAL HIGH

## 2022-08-20 LAB — RHEUMATOID FACTOR: Rheumatoid fact SerPl-aCnc: 80 IU/mL — ABNORMAL HIGH (ref <14)

## 2022-08-23 LAB — CK TOTAL AND CKMB (NOT AT ARMC)
CK, MB: <0.7 ng/mL (ref 0–5.0)
Total CK: 111 U/L (ref 29–143)

## 2022-08-23 LAB — ALDOLASE: Aldolase: 3.1 U/L (ref <=8.1)

## 2022-09-03 ENCOUNTER — Telehealth: Payer: Self-pay | Admitting: Internal Medicine

## 2022-09-04 NOTE — Telephone Encounter (Signed)
Spoke with pt and advised of Dr Golden Pop recommendations. Pt schedule to see Roxan Diesel, NP 09/06/22 10:30. Pt verbalized understanding.

## 2022-09-04 NOTE — Telephone Encounter (Signed)
Spoke with pt and she states that her breathing has gotten worse since her ov with Dr Chase Caller on 08/16/22. Pt states that she walks 5 steps to her mailbox and has to come in to rest. Pt denies cough or congestion. Denies Fever, chills or sweats. Pt is scheduled for HRCT on 09/26/22 and PFT and OV with Dr Chase Caller on 10/09/2022. She has not had ONO yet. PT would like to know if Dr Chase Caller can give her something to help her  breathing before she comes back to see him. Please advise.

## 2022-09-04 NOTE — Telephone Encounter (Signed)
Her symptoms are out of proportion to the current visit and the test schedule so far out that I cannot make a meaningful impression as to her decline.  She needs acute visit this week with the nurse practitioner either today or tomorrow.  If still getting worse should go to the emergency department.  Her autoimmune antibodies are positive but we will have to put this in context with rest of the investigations which are scheduled.

## 2022-09-06 ENCOUNTER — Ambulatory Visit (INDEPENDENT_AMBULATORY_CARE_PROVIDER_SITE_OTHER): Payer: Medicare Other

## 2022-09-06 ENCOUNTER — Ambulatory Visit (INDEPENDENT_AMBULATORY_CARE_PROVIDER_SITE_OTHER): Payer: Medicare Other | Admitting: Nurse Practitioner

## 2022-09-06 ENCOUNTER — Encounter: Payer: Self-pay | Admitting: Nurse Practitioner

## 2022-09-06 VITALS — BP 124/60 | HR 60 | Ht 60.0 in | Wt 125.4 lb

## 2022-09-06 DIAGNOSIS — R0602 Shortness of breath: Secondary | ICD-10-CM

## 2022-09-06 DIAGNOSIS — J849 Interstitial pulmonary disease, unspecified: Secondary | ICD-10-CM

## 2022-09-06 DIAGNOSIS — J432 Centrilobular emphysema: Secondary | ICD-10-CM | POA: Insufficient documentation

## 2022-09-06 MED ORDER — PREDNISONE 20 MG PO TABS: 40.0000 mg | ORAL_TABLET | Freq: Every day | ORAL | 0 refills | Status: AC

## 2022-09-06 MED ORDER — SPIRIVA RESPIMAT 2.5 MCG/ACT IN AERS: 2.0000 | INHALATION_SPRAY | Freq: Every day | RESPIRATORY_TRACT | 0 refills | Status: DC

## 2022-09-06 NOTE — Assessment & Plan Note (Signed)
Possible CT ILD? She has positive ANA, rheumatoid factor, and scleroderma antibody. ESR elevated. 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 She does have some appearance of skin tightening to her face; occasional right hip pain. Urgent referral to rheumatology placed today for further evaluation. We are awaiting HRCT and full PFTs, which are scheduled for October 2023.

## 2022-09-06 NOTE — Progress Notes (Signed)
_0  ID: Tina Olson, female    DOB: 18-Nov-1946, 76 y.o.   MRN: [id number]  Chief Complaint  Patient presents with   Acute Visit    SOB    Referring provider: Loura Pardon, MD  HPI: 76 year old female, former smoker followed for ILD. She is a patient of Dr. Golden Pop and last seen in office 08/16/2022 for initial consult. She has been followed by lung cancer screening program as well. 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   TEST/EVENTS:   08/16/2022: OV with Dr. Chase Caller for initial pulmonary consult. Recent cardiac CT with ILD changes. Referred for further eval. changes were noted on scans dating back to 2020.  Started having insidious onset of shortness of breath over the past year or longer.  Slowly getting worse.  Made worse with exertion and stooping forward.  Improved by rest but there is also variability to it.  No significant cough per her recollection.  Does have a second cousin with IPF who sees Dr. Vaughan Browner and family history of asthma.  She was a former heavy smoker.  Quit sometime between 2008-2010.  Previous IV drug use in the past but quit in 2003.  Immunosuppressed for past few years with Imuran due to ophthalmic issues.  No occupational exposures.  PFT and HRCT ordered for further evaluation.  Ono on room air.  Plan for walking oximetry at follow-up.  Autoimmune/ILD panel.   09/06/2022: Today-acute Patient presents today for acute visit.  She had contacted the office on 9/25 reported that she was having increased shortness of breath and fatigue.  Felt like her breathing was limiting what all she could do.  She was advised to go to the ED or come in for acute visit.  Today, she reports feeling relatively unchanged.  She cannot exactly identify when she started feeling worse after seeing Dr. Chase Caller last.  Feels like it has been a few weeks.  She tells me that she just feels  like she has progressively declined over the past 6 months to a year, and then within the past few weeks has noticed that she cannot walk as far without having to stop to rest.  She has an occasional dry cough.  Does feel like she has some chest congestion but cannot produce any sputum.  No significant wheezing.  She denies any fevers, night sweats, chills, hemoptysis, calf pain or swelling.  She has been told in the past that she has COPD.  She has never been on any inhalers for this. Her autoimmune/ILD panel recently completed was significant for positive scleroderma antibody, ANA and rheumatoid factor.  She also had elevated ESR.  She denies any significant joint pain.  Occasionally has some mild pain in her right hip, which she reports is sciatica.  Denies any joint pain or swelling.  She occasionally has some numbness in her hands; does not feel like they are tight.  No significant skin discoloration or changes.  She has been struggling with some sort of inflammatory ophthalmic disease.  She was told by her ophthalmologist that it could possibly be related to an underlying autoimmune disease but she was never evaluated by rheumatology for this.  She is currently on Imuran  Allergies  Allergen Reactions   Iodinated Contrast Media Hives and Itching   Lisinopril Itching and Cough   Other Hives and Itching     Seafood crustaceans  "itchy throat"  Immunization History  Administered Date(s) Administered   Fluad Quad(high Dose 65+) 01/07/2020, 02/15/2022   Hepatitis A 09/22/2010, 05/02/2011   Hepatitis B 05/10/2008, 06/09/2008, 10/11/2008   Hepatitis B, adult 10/14/2013, 11/13/2013, 05/17/2014   Influenza Split 10/08/2011, 09/18/2012   Influenza Whole 10/11/2008, 10/20/2009, 09/22/2010, 10/25/2014   Influenza,inj,Quad PF,6+ Mos 08/03/2013, 11/04/2017, 09/24/2018   Influenza-Unspecified 10/24/2015, 10/17/2016, 11/24/2020   PFIZER(Purple Top)SARS-COV-2 Vaccination 02/05/2020, 03/02/2020,  11/01/2020   Pneumococcal Conjugate-13 09/24/2018   Pneumococcal Polysaccharide-23 09/09/2008, 04/01/2013, 01/07/2020    Past Medical History:  Diagnosis Date   Acid reflux    Anemia 2017   Difficult intravenous access    Hepatitis C 07/2014   took harvani tx    History of blood transfusion 2017   2 units   HIV infection ([place]) since 1993   Hypertension    Internal hemorrhoid    Right inguinal hernia     Tobacco History: Social History   Tobacco Use  Smoking Status Former   Packs/day: 0.75   Years: 45.00   Total pack years: 33.75   Types: Cigarettes   Quit date: 2010   Years since quitting: 13.7  Smokeless Tobacco Never   Counseling given: Not Answered   Outpatient Medications Prior to Visit  Medication Sig Dispense Refill   fluticasone (FLONASE) 50 MCG/ACT nasal spray Place 1 spray into both nostrils daily as needed for allergies or rhinitis.     acetaminophen (TYLENOL) 325 MG tablet Take 325 mg by mouth daily as needed for moderate pain or headache.     amLODipine (NORVASC) 10 MG tablet Take 10 mg by mouth daily.     aspirin 81 MG chewable tablet Chew 1 tablet by mouth daily.     azaTHIOprine (IMURAN) 50 MG tablet Take 25 mg by mouth daily.     BIOTIN PO Take 500 mcg by mouth daily.     calcium carbonate (OSCAL) 1500 (600 Ca) MG TABS tablet Take 1,500 mg by mouth daily with breakfast.     docusate sodium (COLACE) 100 MG capsule Take 100 mg by mouth as needed for mild constipation.     Omega-3 Fatty Acids (FISH OIL) 1200 MG CAPS Take 1,200 mg by mouth daily.     polyethylene glycol (MIRALAX / GLYCOLAX) packet Take 17 g by mouth daily as needed for mild constipation.      rosuvastatin (CRESTOR) 20 MG tablet Take 20 mg by mouth at bedtime.     valACYclovir (VALTREX) 1000 MG tablet Take 1,000 mg by mouth daily.      No facility-administered medications prior to visit.     Review of Systems:   Constitutional: No weight loss or gain, night sweats, fevers, chills.  +fatigue, lassitude. HEENT: No headaches, difficulty swallowing, tooth/dental problems, or sore throat. No sneezing, itching, ear ache, nasal congestion, or post nasal drip CV:  No chest pain, orthopnea, PND, swelling in lower extremities, anasarca, dizziness, palpitations, syncope Resp: +shortness of breath with exertion; occasional dry cough; chest congestion. No excess mucus or change in color of mucus. No hemoptysis. No wheezing.  No chest wall deformity GI:  No heartburn, indigestion, abdominal pain, nausea, vomiting, diarrhea, change in bowel habits, loss of appetite, bloody stools.  GU: No dysuria, change in color of urine, urgency or frequency.  No flank pain, no hematuria  Skin: No rash, lesions, ulcerations MSK:  +occasional right hip pain. No joint swelling.  No decreased range of motion.  No back pain. Neuro: No dizziness or lightheadedness.  Psych: No depression or  anxiety. Mood stable.     Physical Exam:  BP 124/60 (BP Location: Right Arm)   Pulse 60   Ht 5' (1.524 m)   Wt 125 lb 6.4 oz (56.9 kg)   SpO2 100%   BMI 24.49 kg/m   GEN: Pleasant, interactive, well-appearing; in no acute distress. HEENT:  Normocephalic and atraumatic. PERRLA. Sclera white. Nasal turbinates pink, moist and patent bilaterally. No rhinorrhea present. Oropharynx pink and moist, without exudate or edema. No lesions, ulcerations, or postnasal drip.  NECK:  Supple w/ fair ROM. No JVD present. Normal carotid impulses w/o bruits. Thyroid symmetrical with no goiter or nodules palpated. No lymphadenopathy.   CV: RRR, no m/r/g, no peripheral edema. Pulses intact, +2 bilaterally. No cyanosis, pallor or clubbing. PULMONARY:  Unlabored, regular breathing. Mild bibasilar crackles otherwise clear bilaterally A&P w/o wheezes/rales/rhonchi. No accessory muscle use.  GI: BS present and normoactive. Soft, non-tender to palpation. No organomegaly or masses detected.  MSK: No erythema, warmth or tenderness. No  deformities or joint swelling noted.  Neuro: A/Ox3. No focal deficits noted.   Skin: Warm, no lesions or rashe Psych: Normal affect and behavior. Judgement and thought content appropriate.     Lab Results:  CBC    Component Value Date/Time   WBC 2.4 (L) 05/18/2021 1612   RBC 3.56 (L) 05/18/2021 1612   HGB 10.3 (L) 05/18/2021 1612   HCT 30.8 (L) 05/18/2021 1612   PLT 212 05/18/2021 1612   MCV 86.5 05/18/2021 1612   MCH 28.9 05/18/2021 1612   MCHC 33.4 05/18/2021 1612   RDW 17.0 (H) 05/18/2021 1612   LYMPHSABS 1,817 03/03/2019 0935   MONOABS 0.3 10/04/2014 1428   EOSABS 19 03/03/2019 0935   BASOSABS 19 03/03/2019 0935    BMET    Component Value Date/Time   NA 136 07/02/2022 0934   K 4.4 07/02/2022 0934   CL 102 07/02/2022 0934   CO2 20 07/02/2022 0934   GLUCOSE 89 07/02/2022 0934   GLUCOSE 107 (H) 02/15/2022 0328   BUN 15 07/02/2022 0934   CREATININE 0.91 07/02/2022 0934   CREATININE 0.91 02/15/2022 0328   CALCIUM 9.8 07/02/2022 0934   GFRNONAA >60 04/04/2020 1146   GFRNONAA 53 (L) 03/03/2019 0935   GFRAA >60 04/04/2020 1146   GFRAA 61 03/03/2019 0935    BNP No results found for: "BNP"   Imaging:  DG Chest 2 View  Result Date: 09/06/2022 CLINICAL DATA:  Shortness of breath EXAM: CHEST - 2 VIEW COMPARISON:  Chest radiographs done on 04/04/2020 and CT done on 07/19/2022 FINDINGS: Cardiac size is within normal limits. Thoracic aorta is tortuous. There are no signs of pulmonary edema or focal pulmonary consolidation. There is no pleural effusion or pneumothorax. IMPRESSION: There are no new infiltrates or signs of pulmonary edema. Electronically Signed   By: Elmer Picker M.D.   On: 09/06/2022 11:02          No data to display          No results found for: "NITRICOXIDE"      Assessment & Plan:   Shortness of breath Possible ILD flare vs COPD exacerbation? She has emphysematous changes on imaging with heavy smoking history; no previous formal  PFTs. Currently awaiting these which are scheduled for 10/31. VS stable and non-toxic appearing today. Walking oximetry on room air without desaturations. CXR today without acute process. Wells low probability for PE. We will treat her with prednisone burst. Close follow up and strict ED precautions should she  worsen.   Patient Instructions  Trial Spiriva 2 puffs daily. Do not take the day of your pulmonary function testing.  Continue flonase nasal spray 2 spray each nostril daily for nasal congestion/allergies  Prednisone 40 mg daily for 5 days. Take in AM with food Mucinex 600 mg Twice daily for chest congestion/cough  Referral to rheumatology   Overnight oximetry test - someone will contact you for scheduling  Attend high resolution CT chest on 10/18  Pulmonary function testing previously scheduled for 10/31  Follow up in 7-10 days with Dr. Chase Caller or Alanson Aly. If symptoms do not improve or worsen, please contact office for sooner follow up or seek emergency care.     ILD (interstitial lung disease) ([place]) Possible CT ILD? She has positive ANA, rheumatoid factor, and scleroderma antibody. ESR elevated. 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 She does have some appearance of skin tightening to her face; occasional right hip pain. Urgent referral to rheumatology placed today for further evaluation. We are awaiting HRCT and full PFTs, which are scheduled for October 2023.   Centrilobular emphysema ([place]) Former heavy smoker. Emphysematous changes on imaging. Currently with increased SOB; possible AECOPD. See above plan. Recommended we trial her on LAMA therapy. Depending on response and PFTs, may step her up.    I spent 42 minutes of dedicated to the care of this patient on the date of this encounter to include pre-visit review of records, face-to-face time with the patient discussing conditions  above, post visit ordering of testing, clinical documentation with the electronic health record, making appropriate referrals as documented, and communicating necessary findings to members of the patients care team.  Clayton Bibles, NP 09/06/2022  Pt aware and understands NP's role.

## 2022-09-06 NOTE — Assessment & Plan Note (Signed)
Former heavy smoker. Emphysematous changes on imaging. Currently with increased SOB; possible AECOPD. See above plan. Recommended we trial her on LAMA therapy. Depending on response and PFTs, may step her up.

## 2022-09-06 NOTE — Patient Instructions (Addendum)
Trial Spiriva 2 puffs daily. Do not take the day of your pulmonary function testing.  Continue flonase nasal spray 2 spray each nostril daily for nasal congestion/allergies  Prednisone 40 mg daily for 5 days. Take in AM with food Mucinex 600 mg Twice daily for chest congestion/cough  Referral to rheumatology   Overnight oximetry test - someone will contact you for scheduling  Attend high resolution CT chest on 10/18  Pulmonary function testing previously scheduled for 10/31  Follow up in 7-10 days with Dr. Chase Caller or Alanson Aly. If symptoms do not improve or worsen, please contact office for sooner follow up or seek emergency care.

## 2022-09-06 NOTE — Assessment & Plan Note (Signed)
Possible ILD flare vs COPD exacerbation? She has emphysematous changes on imaging with heavy smoking history; no previous formal PFTs. Currently awaiting these which are scheduled for 10/31. VS stable and non-toxic appearing today. Walking oximetry on room air without desaturations. CXR today without acute process. [place] low probability for PE. We will treat her with prednisone burst. Close follow up and strict ED precautions should she worsen.   Patient Instructions  Trial Spiriva 2 puffs daily. Do not take the day of your pulmonary function testing.  Continue flonase nasal spray 2 spray each nostril daily for nasal congestion/allergies  Prednisone 40 mg daily for 5 days. Take in AM with food Mucinex 600 mg Twice daily for chest congestion/cough  Referral to rheumatology   Overnight oximetry test - someone will contact you for scheduling  Attend high resolution CT chest on 10/18  Pulmonary function testing previously scheduled for 10/31  Follow up in 7-10 days with Dr. Chase Caller or Alanson Aly. If symptoms do not improve or worsen, please contact office for sooner follow up or seek emergency care.

## 2022-09-07 ENCOUNTER — Telehealth: Payer: Self-pay | Admitting: Nurse Practitioner

## 2022-09-07 NOTE — Telephone Encounter (Signed)
Pt said the prednisone did not agree with her so she no longer wants to take it. Pt does state the Spiriva is effective.   Routing to [employer] as FYI.

## 2022-09-07 NOTE — Telephone Encounter (Signed)
Patient wanted Roxan Diesel, NP to know that she took the first 2 prednisone tablets and did not have a good feeling and does not want anymore prednisone- she is not going to finish taking it. Patient states that the inhaler is helping her breathing.   Please advise.

## 2022-09-11 NOTE — Telephone Encounter (Signed)
Okay, thanks

## 2022-09-13 ENCOUNTER — Telehealth: Payer: Self-pay | Admitting: Internal Medicine

## 2022-09-13 DIAGNOSIS — J849 Interstitial pulmonary disease, unspecified: Secondary | ICD-10-CM

## 2022-09-13 DIAGNOSIS — G4734 Idiopathic sleep related nonobstructive alveolar hypoventilation: Secondary | ICD-10-CM

## 2022-09-13 NOTE — Telephone Encounter (Signed)
Overnight pulse oximetry on Tina Olson date of birth 11-19-1946.  This was done on 09/09/2022.  Pulse ox less than or equal to 88% was 7 minutes and 19 seconds.  Plan - Technically qualifies for nighttime oxygen but if she is not keen on doing it that is fine.  On the other hand if she wants to try 1 L nasal cannula at night and see how she feels in the daytime we could do a time-limited trial for 3 months

## 2022-09-17 NOTE — Telephone Encounter (Signed)
Patient returning call for ONO results.

## 2022-09-17 NOTE — Telephone Encounter (Signed)
Attempted to call pt but unable to reach. Left message for her to return call. 

## 2022-09-17 NOTE — Telephone Encounter (Signed)
Called and spoke with pt letting her know the results of the ONO and she verbalized understanding. Pt said she would give the O2 a try to see how it would make her feel during the daytime. Order placed.nothing further needed.

## 2022-09-26 ENCOUNTER — Ambulatory Visit
Admission: RE | Admit: 2022-09-26 | Discharge: 2022-09-26 | Disposition: A | Discharge status: Home / Self Care | Payer: Medicare Other | Source: Ambulatory Visit | Attending: Internal Medicine | Admitting: Internal Medicine

## 2022-09-26 DIAGNOSIS — J849 Interstitial pulmonary disease, unspecified: Secondary | ICD-10-CM

## 2022-10-09 ENCOUNTER — Ambulatory Visit (INDEPENDENT_AMBULATORY_CARE_PROVIDER_SITE_OTHER): Payer: Medicare Other | Admitting: Internal Medicine

## 2022-10-09 ENCOUNTER — Encounter: Payer: Self-pay | Admitting: Internal Medicine

## 2022-10-09 VITALS — BP 110/54 | HR 66 | Temp 98.0°F | Ht 60.5 in | Wt 122.6 lb

## 2022-10-09 DIAGNOSIS — J439 Emphysema, unspecified: Secondary | ICD-10-CM | POA: Diagnosis not present

## 2022-10-09 DIAGNOSIS — J849 Interstitial pulmonary disease, unspecified: Secondary | ICD-10-CM | POA: Diagnosis not present

## 2022-10-09 DIAGNOSIS — Z87891 Personal history of nicotine dependence: Secondary | ICD-10-CM

## 2022-10-09 DIAGNOSIS — R768 Other specified abnormal immunological findings in serum: Secondary | ICD-10-CM

## 2022-10-09 DIAGNOSIS — J479 Bronchiectasis, uncomplicated: Secondary | ICD-10-CM | POA: Diagnosis not present

## 2022-10-09 LAB — PULMONARY FUNCTION TEST
DL/VA % pred: 44 %
DL/VA: 1.9 ml/min/mmHg/L
DLCO cor % pred: 50 %
DLCO cor: 8.44 ml/min/mmHg
DLCO unc % pred: 50 %
DLCO unc: 8.44 ml/min/mmHg
FEF 25-75 Post: 2.65 L/sec
FEF 25-75 Pre: 1.21 L/sec
FEF2575-%Change-Post: 117 %
FEF2575-%Pred-Post: 178 %
FEF2575-%Pred-Pre: 81 %
FEV1-%Change-Post: 34 %
FEV1-%Pred-Post: 112 %
FEV1-%Pred-Pre: 83 %
FEV1-Post: 1.99 L
FEV1-Pre: 1.48 L
FEV1FVC-%Change-Post: 22 %
FEV1FVC-%Pred-Pre: 87 %
FEV6-%Change-Post: 9 %
FEV6-%Pred-Post: 109 %
FEV6-%Pred-Pre: 99 %
FEV6-Post: 2.46 L
FEV6-Pre: 2.25 L
FEV6FVC-%Pred-Post: 105 %
FEV6FVC-%Pred-Pre: 105 %
FVC-%Change-Post: 9 %
FVC-%Pred-Post: 103 %
FVC-%Pred-Pre: 94 %
FVC-Post: 2.46 L
FVC-Pre: 2.25 L
Post FEV1/FVC ratio: 81 %
Post FEV6/FVC ratio: 100 %
Pre FEV1/FVC ratio: 66 %
Pre FEV6/FVC Ratio: 100 %
RV % pred: 133 %
RV: 2.77 L
TLC % pred: 119 %
TLC: 5.32 L

## 2022-10-09 MED ORDER — SPIRIVA RESPIMAT 1.25 MCG/ACT IN AERS: 2.0000 | INHALATION_SPRAY | Freq: Every day | RESPIRATORY_TRACT | 0 refills | Status: DC

## 2022-10-09 NOTE — Patient Instructions (Addendum)
ICD-10-CM   1. Pulmonary emphysema, unspecified emphysema type ([place])  J43.9     2. Former smoker  Z87.891     3. Bronchiectasis without complication ([place])  V49.4     4. Scl-70 antibody positive  R76.8     5. ANA positive  R76.8     6. Rheumatoid factor positive  R76.8      -There is no evidence of interstitial lung disease according to radiologist but I believe you are at risk of developing it in the future because of your autoimmune profile  -Currently emphysema is the main pulmonary issue associated with very mild bronchiectasis  -Noted that high-dose Spiriva taken excessively at 2 puffs 2 times daily cause side effects  Plan - Take low-dose Spiriva  1.25 mcg @ 2 puff  each tme but only once daily  - ensure you have followup with a rheumatologist - ensure you see ID doctor - stop fish oil - makes GERD worse  Followup -6 month routine 15 min followup  - walk test, symptom score at followup

## 2022-10-09 NOTE — Progress Notes (Signed)
OV 08/16/2022  Subjective:  Patient ID: Tina Olson, female , DOB: Nov 15, 1946 , age 76 y.o. , MRN: [id number] , ADDRESS: [place] [id number] PCP Tina Pardon, MD Patient Care Team: Tina Pardon, MD as PCP - General (Family Medicine) Tina Riches, MD as PCP - Infectious Diseases (Infectious Diseases)  This Provider for this visit: Treatment Team:  Attending Provider: Brand Males, MD    08/16/2022 -   Chief Complaint  Patient presents with   Consult    Pt is here due to recent CT coronary results.  Pt states that she does have some SOB especially if she goes to the mailbox to bend down to pick something up.     HPI  Tina Olson ILD Questionnaire Tina Olson 76 y.o. -76 y.o. 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 at. Patient has hx of pseudophakia both eyes s/p PCIOL OU with Tina. Katy Olson and inflammation in the left eye ever since cataract surgery on 06/22/19. She has hx of HIV since 1993 and hx of Syphilis with treatment over 10 year ago. Currently on Valtrex 1g daily and Azathioprine 50 mg daily. Sees Tina Olson t [facility]   Has HIV Hx of HIV since 1993 and hx of Syphilis over 10 years ago  -sees Tina Olson  She is a former smoker who undergoes lung cancer screening as well  She actually has interstitial lung disease mentioned on CT scans in 2020 but she has not been aware of this until she had a cardiac CT in 2023 August.  Therefore she has been referred here.  She tells me that she has had insidious onset of shortness of breath for at least a year and slowly getting worse.  Made worse with exertion and stooping forward.  Improved by rest but there is also variability to it and sometimes it is not there.        Past Medical History :  Is HIV positive and follows with Tina. Johnnye Olson Has ophthalmologic issues and is immunosuppressed and is on Imuran for at least  a few years.   Positive for longstanding history of acid reflux disease  Denies any history of asthma but somebody apparently told her she has COPD but she is not sure Denies any connective tissue disease or vasculitis Denies any diabetes or stroke or hepatitis or tuberculosis. Denies any kidney disease She has had the COVID-vaccine but has not had COVID.  ROS:  Positive for shortness of breath with exertion.  Not sure she has cough  FAMILY HISTORY of LUNG DISEASE:  -Her biologic second cousin Gaynell Olson has IPF and follows with Tina. Vaughan Olson. With family history of asthma  PERSONAL EXPOSURE HISTORY:  She used to smoke at least 20 cigarettes a day.  She quit smoking sometime between 2018 2010.  No marijuana smoking no vaping no cocaine use.  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  She did use it quite a lot  HOME  EXPOSURE and HOBBY DETAILS :  Single-family home [place] the last 11 years.  Detail organic antigen exposure history in the house is negative  OCCUPATIONAL HISTORY (122 questions) : She grew up in [place] in her childhood although she was born in [place].  Then she moved to [place] for high school and she is lived here since.  She used to work in [employer] and retired in  2010.  During this time was exposed to steam iron and cleaning equipment but no known organic or inorganic antigen exposure.  PULMONARY TOXICITY HISTORY (27 items):  -Is on Imuran for ophthalmology issues.  INVESTIGATIONS:    CT Chest data  No results found.    PFT     CT chest July 2021 -personally visualized.  Narrative & Impression  CLINICAL DATA:  76 year old asymptomatic female former smoker presents for follow-up of pulmonary nodules.   EXAM: CT CHEST WITHOUT CONTRAST   TECHNIQUE: Multidetector CT imaging of the chest was performed following the standard protocol without intravenous contrast. High resolution imaging of the  lungs, as well as inspiratory and expiratory imaging, was performed.   COMPARISON:  11/19/2019 CT abdomen/pelvis. 04/04/2020 chest radiograph.   FINDINGS: Cardiovascular: Normal heart size. No significant pericardial effusion/thickening. Three-vessel coronary atherosclerosis. Atherosclerotic nonaneurysmal thoracic aorta. Top-normal caliber main pulmonary artery (3.1 cm diameter).   Mediastinum/Nodes: No discrete thyroid nodules. Unremarkable esophagus. No pathologically enlarged axillary, mediastinal or hilar lymph nodes, noting limited sensitivity for the detection of hilar adenopathy on this noncontrast study.   Lungs/Pleura: No pneumothorax. No pleural effusion. Moderate centrilobular and paraseptal emphysema. No acute consolidative airspace disease or lung masses. Previously visualized clustered nodular opacities in the peripheral basilar right lower lobe on 12/08/2019 CT abdomen study have resolved. No significant pulmonary nodules. No significant lobular air trapping or evidence of tracheobronchomalacia on the expiration sequence. Mild patchy subpleural reticulation and ground-glass opacity with subpleural lines in the lower lungs bilaterally without significant regions of traction bronchiectasis, architectural distortion or frank honeycombing. These findings of not substantially changed at the lung bases since 12/08/2019 CT abdomen study.   Upper abdomen: No acute abnormality.   Musculoskeletal: No aggressive appearing focal osseous lesions. Mild thoracic spondylosis.   IMPRESSION: 1. No significant pulmonary nodules. Previously visualized clustered nodular opacities in the peripheral basilar right lower lobe on 12/08/2019 CT abdomen study have resolved. 2. Mild patchy subpleural reticulation and ground-glass opacity with subpleural lines in the lower lungs bilaterally without bronchiectasis or honeycombing, unchanged since 12/08/2019 CT abdomen study. Mild  interstitial lung disease such as NSIP or early UIP not excluded. Findings are indeterminate for UIP per consensus guidelines: Diagnosis of Idiopathic Pulmonary Fibrosis: An Official ATS/ERS/JRS/ALAT Clinical Practice Guideline. [place], Iss 5, [id number], Aug 10 2017. 3. Three-vessel coronary atherosclerosis. 4. Aortic Atherosclerosis (ICD10-I70.0) and Emphysema (ICD10-J43.9).     Electronically Signed   By: Ilona Sorrel M.D.   On: 06/16/2020 11:54     09/06/2022: Today-acute Patient presents today for acute visit.  She had contacted the office on 9/25 reported that she was having increased shortness of breath and fatigue.  Felt like her breathing was limiting what all she could do.  She was advised to go to the ED or come in for acute visit.  Today, she reports feeling relatively unchanged.  She cannot exactly identify when she started feeling worse after seeing Tina. Chase Caller last.  Feels like it has been a few weeks.  She tells me that she just feels like she has progressively declined over the past 6 months to a year, and then within the past few weeks has noticed that she cannot walk as far without having to stop to rest.  She has an occasional dry cough.  Does feel like she has some chest congestion but cannot produce any sputum.  No significant wheezing.  She denies any fevers, night sweats, chills, hemoptysis, calf pain or  swelling.  She has been told in the past that she has COPD.  She has never been on any inhalers for this. Her autoimmune/ILD panel recently completed was significant for positive scleroderma antibody, ANA and rheumatoid factor.  She also had elevated ESR.  She denies any significant joint pain.  Occasionally has some mild pain in her right hip, which she reports is sciatica.  Denies any joint pain or swelling.  She occasionally has some numbness in her hands; does not feel like they are tight.  No significant skin discoloration or changes.  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  She was told by her ophthalmologist that it could possibly be related to an underlying autoimmune disease but she was never evaluated by rheumatology for this.  She is currently on Imuran   OV 10/09/2022  Subjective:  Patient ID: Tina Olson, female , DOB: 06-30-1946 , age 42 y.o. 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  - Macular pucker oof left retinae  - hx of pseudophakia both eyes s/p PCIOL OU with Tina. Katy Olson and inflammation in the left eye ever since cataract surgery on 06/22/19.  - Currently on Valtrex 1g daily and Azathioprine 50 mg daily.   She has hx of HIV since 1993 andCD4 count 388 02/15/2022   hx of Syphilis with treatment over 10 year ago.   Has HIV Hx of HIV since 1993 and hx of Syphilis over 10 years ago  -sees Tina Olson  She is a former smoker > 30 ppd -  who undergoes lung cancer screening as well  Zio patch x2 weeks 05/04/2022 showed 37 episodes of SVT, longest lasting 17 beats with average rate 118 bpm, occasional PACs (1.4% of beats).  Echocardiogram 05/09/2022 showed EF 55 to 60%, mild LVH, normal diastolic function, normal RV function, no significant valvular disease  -Tina. Yaakov Guthrie last visit July 2023   GERD: Unclear treatment  ILD   - interstitial lung disease mentioned on CT scans in 2020 but she has not been aware of this until she had a cardiac CT in 2023  August.  -   She tells me that she has had insidious onset of shortness of breath since 2022 and progressive  Scleroderma antibody positivity: Has appointment with Vernelle Emerald January 2024.  HPI Reginia Battie 76 y.o. -returns for follow-up.  Here to go over test results.  Overall she feels stable.  I was fairly convinced based on low-dose CT scan of the chest that she had interstitial lung disease but she had high-resolution CT chest and thoracic radiologist Tina. Rosario Jacks says she has emphysema and some very mild bronchiectasis.  There is no ILD.  Her pulmonary function test only shows slight isolated reduction in diffusion capacity but is otherwise normal.  I did share this news with her and she is quite excited about it.  However serology shows positivity for scleroderma antibody [she admits to thickening of the  skin is on the dorsum of her palm], mild ANA positivity and mild rheumatoid factor positivity.  She is not aware of these from before.  She says she has a rheumatology appointment pending.  Review of the records indicate appointment Tina. Benjamine Mola in January 2024.  Of note: Because of emphysema last visit Katie Cobb put her on Spiriva higher dose which she is supposed to take only 2 puffs once daily but she was taking 2 puffs 2 times daily and a week after this even though she started feeling better from a shortness of breath perspective started have tingling and call 911 and stop.  She was not aware that she only needed to take it once daily.  After hearing that she is willing to take the low-dose only once daily and then reassess.     In terms of HIV she has upcoming appointment with Tina. Johnnye Olson in December 2023.  Last CD4 count was in March 2023  She has SVTs and she seen Tina. Maretta Bees in July 2023    SYMPTOM SCALE - ILD 08/16/2022 10/09/2022   Current weight    O2 use ra ra  Shortness of Breath 0 -> 5 scale with 5 being worst (score 6 If unable to do)   At rest 0 2  Simple tasks  - showers, clothes change, eating, shaving 0 3  Household (dishes, doing bed, laundry) 3 3  Shopping 2 4  Walking level at own pace 2 3  Walking up Stairs 3 4  Total (30-36) Dyspnea Score 10 18      Non-dyspnea symptoms (0-> 5 scale) 08/16/2022 10/09/2022   How bad is your cough? 0 0  How bad is your fatigue 2 4  How bad is nausea 0 2  How bad is vomiting?  0 0  How bad is diarrhea? 0 0  How bad is anxiety? 1 1  How bad is depression 2 1  Any chronic pain - if so where and how bad 0 x     Simple office walk 185 feet x  3 laps goal with forehead probe 10/09/2022    O2 used ra   Number laps completed 3   Comments about pace avg   Resting Pulse Ox/HR 100% and 70/min   Final Pulse Ox/HR 100% and 75/min   Desaturated </= 88% no   Desaturated <= 3% points no   Got Tachycardic >/= 90/min no   Symptoms at end of test xx   Miscellaneous comments x      PFT     Latest Ref Rng & Units 10/09/2022    8:35 AM  PFT Results  FVC-Pre L 2.25  P  FVC-Predicted Pre % 94  P  FVC-Post L 2.46  P  FVC-Predicted Post % 103  P  Pre FEV1/FVC % % 66  P  Post FEV1/FCV % % 81  P  FEV1-Pre L 1.48  P  FEV1-Predicted Pre % 83  P  FEV1-Post L 1.99  P  DLCO uncorrected ml/min/mmHg 8.44  P  DLCO UNC% % 50  P  DLCO corrected ml/min/mmHg 8.44  P  DLCO COR %Predicted % 50  P  DLVA Predicted % 44  P  TLC L 5.32  P  TLC % Predicted % 119  P  RV % Predicted % 133  P    P Preliminary result    Latest Reference Range & Units 08/16/22 09:57  Anti Nuclear Antibody (ANA) NEGATIVE  POSITIVE !  ANA Pattern 1  Nuclear, [place] !  ANA Titer 1 titer 5:05 (H)  Cyclic Citrullin Peptide Ab UNITS <16  ds DNA Ab IU/mL 1  RA Latex Turbid. <14 IU/mL 80 (H)  SSA (Ro) (ENA) Antibody, IgG <1.0 NEG AI <1.0 NEG  SSB (La) (ENA) Antibody, IgG <1.0 NEG AI <1.0 NEG  Scleroderma (Scl-70) (ENA) Antibody, IgG <1.0 NEG AI 1.5 POS !  !: Data is abnormal (H): Data is abnormally high   HRCT  09/26/22  IMPRESSION: 1. No evidence of fibrotic interstitial lung disease. 2. Mild basilar predominant cylindrical bronchiectasis. 3. Aortic atherosclerosis (ICD10-I70.0). Coronary artery calcification. 4.  Emphysema (ICD10-J43.9).     Electronically Signed   By: Lorin Picket M.D.   On: 09/26/2022 13:50   has a past medical history of Acid reflux, Anemia (2017), Difficult intravenous access, Hepatitis C (07/2014), History of blood transfusion (2017), HIV infection ([place]) (since 1993), Hypertension, Internal hemorrhoid, and Right inguinal hernia.   reports that she quit smoking about 13 years ago. Her smoking use included cigarettes. She has a 33.75 pack-year smoking history. She has never used smokeless tobacco.  Past Surgical History:  Procedure Laterality Date   BREAST BIOPSY Right 07/26/2008   CATARACT EXTRACTION Right 2016   CATARACT EXTRACTION Left 06/2019   COLONOSCOPY WITH PROPOFOL N/A 06/11/2017   Procedure: COLONOSCOPY WITH PROPOFOL;  Surgeon: Mauri Pole, MD;  Location: [facility];  Service: Endoscopy;  Laterality: N/A;  PT IS KNOWN HEART STICK. WILL PROBABLY REQUIRE ULTRASOUND OR ANESTHESIA FOR IV STICK.   ESOPHAGOGASTRODUODENOSCOPY (EGD) WITH PROPOFOL N/A 06/11/2017   Procedure: ESOPHAGOGASTRODUODENOSCOPY (EGD) WITH PROPOFOL;  Surgeon: Mauri Pole, MD;  Location: [facility];  Service: Endoscopy;  Laterality: N/A;    Allergies  Allergen Reactions   Iodinated Contrast Media Hives and Itching   Lisinopril Itching and Cough   Other Hives and Itching     Seafood crustaceans  "itchy throat"    Immunization History  Administered Date(s) Administered   Fluad Quad(high Dose 65+) 01/07/2020, 02/15/2022, 09/19/2022   Hepatitis A 09/22/2010, 05/02/2011   Hepatitis B 05/10/2008, 06/09/2008, 10/11/2008   Hepatitis B, adult 10/14/2013, 11/13/2013, 05/17/2014   Influenza Split 10/08/2011, 09/18/2012   Influenza Whole 10/11/2008, 10/20/2009, 09/22/2010,  10/25/2014   Influenza,inj,Quad PF,6+ Mos 08/03/2013, 11/04/2017, 09/24/2018   Influenza-Unspecified 10/24/2015, 10/17/2016, 11/24/2020   Moderna SARS-COV2 Booster Vaccination 09/26/2022   PFIZER(Purple Top)SARS-COV-2 Vaccination 02/05/2020, 03/02/2020, 11/01/2020   Pneumococcal Conjugate-13 09/24/2018   Pneumococcal Polysaccharide-23 09/09/2008, 04/01/2013, 01/07/2020    Family History  Problem Relation Age of Onset   Alzheimer's disease Mother    Glaucoma Mother    Hypertension Mother    Cancer Father        ? type   Seizures Son      Current Outpatient Medications:    acetaminophen (TYLENOL) 325 MG tablet, Take 325 mg by mouth daily as needed for moderate pain or headache., Disp: , Rfl:    amLODipine (NORVASC) 10 MG tablet, Take 10 mg by mouth daily., Disp: , Rfl:    aspirin 81 MG chewable tablet, Chew 1 tablet by mouth daily., Disp: , Rfl:    azaTHIOprine (IMURAN) 50 MG tablet, Take 25 mg by mouth daily., Disp: , Rfl:    BIOTIN PO, Take 500 mcg by mouth daily., Disp: , Rfl:    calcium carbonate (OSCAL) 1500 (600 Ca) MG TABS tablet, Take 1,500 mg by mouth daily with breakfast., Disp: , Rfl:    docusate sodium (COLACE) 100 MG capsule, Take 100 mg by mouth as  needed for mild constipation., Disp: , Rfl:    fluticasone (FLONASE) 50 MCG/ACT nasal spray, Place 1 spray into both nostrils daily as needed for allergies or rhinitis., Disp: , Rfl:    Omega-3 Fatty Acids (FISH OIL) 1200 MG CAPS, Take 1,200 mg by mouth daily., Disp: , Rfl:    polyethylene glycol (MIRALAX / GLYCOLAX) packet, Take 17 g by mouth daily as needed for mild constipation. , Disp: , Rfl:    rosuvastatin (CRESTOR) 20 MG tablet, Take 20 mg by mouth in the morning., Disp: , Rfl:    valACYclovir (VALTREX) 1000 MG tablet, Take 1,000 mg by mouth daily. , Disp: , Rfl:       Objective:   Vitals:   10/09/22 1006  BP: (!) 110/54  Pulse: 66  Temp: 98 F (36.7 C)  TempSrc: Oral  SpO2: 99%  Weight: 122 lb 10.1 oz (55.6  kg)  Height: 5' 0.5" (1.537 m)    Estimated body mass index is 23.56 kg/m as calculated from the following:   Height as of this encounter: 5' 0.5" (1.537 m).   Weight as of this encounter: 122 lb 10.1 oz (55.6 kg).  _0 @  Filed Weights   10/09/22 1006  Weight: 122 lb 10.1 oz (55.6 kg)     Physical Exam    General: No distress. Looks well. Thin Neuro: Alert and Oriented x 3. GCS 15. Speech normal Psych: Pleasant Resp:  Barrel Chest - no.  Wheeze - nop, Crackles - R Base crackle, No overt respiratory distress CVS: Normal heart sounds. Murmurs - no Ext: Stigmata of Connective Tissue Disease - no HEENT: Normal upper airway. PEERL +. No post nasal drip        Assessment:       ICD-10-CM   1. Pulmonary emphysema, unspecified emphysema type ([place])  J43.9     2. Former smoker  Z87.891     3. Bronchiectasis without complication ([place])  C62.3     4. Scl-70 antibody positive  R76.8     5. ANA positive  R76.8     6. Rheumatoid factor positive  R76.8          Plan:     Patient Instructions     ICD-10-CM   1. Pulmonary emphysema, unspecified emphysema type ([place])  J43.9     2. Former smoker  Z87.891     3. Bronchiectasis without complication ([place])  J62.8     4. Scl-70 antibody positive  R76.8     5. ANA positive  R76.8     6. Rheumatoid factor positive  R76.8      -There is no evidence of interstitial lung disease according to radiologist but I believe you are at risk of developing it in the future because of your autoimmune profile  -Currently emphysema is the main pulmonary issue associated with very mild bronchiectasis  -Noted that high-dose Spiriva taken excessively at 2 puffs 2 times daily cause side effects  Plan - Take low-dose Spiriva  1.25 mcg @ 2 puff  each tme but only once daily  - ensure you have followup with a rheumatologist - ensure you see ID doctor - stop fish oil - makes GERD worse  Followup -6 month routine 15 min  followup  - walk test, symptom score at followup  ( Level 05 visit: Estb 40-54 min  in  visit type: on-site physical face to visit  in total care time and counseling or/and coordination of care by this undersigned MD -  Tina Tina Olson. This includes one or more of the following on this same day 10/09/2022: pre-charting, chart review, note writing, documentation discussion of test results, diagnostic or treatment recommendations, prognosis, risks and benefits of management options, instructions, education, compliance or risk-factor reduction. It excludes time spent by the [facility] or office staff in the care of the patient. Actual time 40 min)   SIGNATURE    Tina. Brand Olson, M.D., F.C.C.P,  Pulmonary and Critical Care Medicine Staff Physician, [facility] Director - Interstitial Lung Disease  Program  Pulmonary [facility] [place], [place], [address]  Pager: [phone], If no answer or between  15:00h - 7:00h: call [phone] Telephone: [phone]  11:04 AM 10/09/2022

## 2022-10-09 NOTE — Patient Instructions (Signed)
Full PFT performed today. °

## 2022-10-09 NOTE — Progress Notes (Signed)
Full PFT performed today. °

## 2022-11-06 ENCOUNTER — Other Ambulatory Visit (HOSPITAL_COMMUNITY)
Admission: RE | Admit: 2022-11-06 | Discharge: 2022-11-06 | Disposition: A | Discharge status: Home / Self Care | Payer: Medicare Other | Source: Ambulatory Visit | Attending: Infectious Diseases | Admitting: Infectious Diseases

## 2022-11-06 ENCOUNTER — Telehealth: Payer: Self-pay | Admitting: *Deleted

## 2022-11-06 ENCOUNTER — Other Ambulatory Visit: Payer: Self-pay

## 2022-11-06 ENCOUNTER — Other Ambulatory Visit (INDEPENDENT_AMBULATORY_CARE_PROVIDER_SITE_OTHER): Payer: Medicare Other

## 2022-11-06 ENCOUNTER — Observation Stay (HOSPITAL_COMMUNITY)
Admission: EM | Admit: 2022-11-06 | Discharge: 2022-11-07 | Disposition: A | Discharge status: Home / Self Care | Payer: Medicare Other | Attending: Internal Medicine | Admitting: Internal Medicine

## 2022-11-06 DIAGNOSIS — B20 Human immunodeficiency virus [HIV] disease: Secondary | ICD-10-CM | POA: Diagnosis not present

## 2022-11-06 DIAGNOSIS — D62 Acute posthemorrhagic anemia: Secondary | ICD-10-CM | POA: Diagnosis not present

## 2022-11-06 DIAGNOSIS — Z79899 Other long term (current) drug therapy: Secondary | ICD-10-CM | POA: Insufficient documentation

## 2022-11-06 DIAGNOSIS — Z87891 Personal history of nicotine dependence: Secondary | ICD-10-CM | POA: Diagnosis not present

## 2022-11-06 DIAGNOSIS — D72819 Decreased white blood cell count, unspecified: Secondary | ICD-10-CM | POA: Diagnosis not present

## 2022-11-06 DIAGNOSIS — Z113 Encounter for screening for infections with a predominantly sexual mode of transmission: Secondary | ICD-10-CM | POA: Insufficient documentation

## 2022-11-06 DIAGNOSIS — D649 Anemia, unspecified: Secondary | ICD-10-CM | POA: Diagnosis present

## 2022-11-06 DIAGNOSIS — I10 Essential (primary) hypertension: Secondary | ICD-10-CM | POA: Diagnosis present

## 2022-11-06 DIAGNOSIS — Z21 Asymptomatic human immunodeficiency virus [HIV] infection status: Secondary | ICD-10-CM | POA: Diagnosis present

## 2022-11-06 DIAGNOSIS — Z7982 Long term (current) use of aspirin: Secondary | ICD-10-CM | POA: Diagnosis not present

## 2022-11-06 DIAGNOSIS — E785 Hyperlipidemia, unspecified: Secondary | ICD-10-CM | POA: Diagnosis present

## 2022-11-06 LAB — POC OCCULT BLOOD, ED: Fecal Occult Bld: NEGATIVE

## 2022-11-06 LAB — CBC
HCT: 22.4 % — ABNORMAL LOW (ref 36.0–46.0)
Hemoglobin: 6.4 g/dL — CL (ref 12.0–15.0)
MCH: 22.6 pg — ABNORMAL LOW (ref 26.0–34.0)
MCHC: 28.6 g/dL — ABNORMAL LOW (ref 30.0–36.0)
MCV: 79.2 fL — ABNORMAL LOW (ref 80.0–100.0)
Platelets: 194 10*3/uL (ref 150–400)
RBC: 2.83 MIL/uL — ABNORMAL LOW (ref 3.87–5.11)
RDW: 18.5 % — ABNORMAL HIGH (ref 11.5–15.5)
WBC: 2.6 10*3/uL — ABNORMAL LOW (ref 4.0–10.5)
nRBC: 0 % (ref 0.0–0.2)

## 2022-11-06 NOTE — ED Triage Notes (Signed)
PT was called by his doctor and told patient that her hemoglobin was low and she needs to come for a blood transfusion. Reports some rectal bleeding with hemorrhoids. States dizziness with bending over

## 2022-11-06 NOTE — ED Provider Notes (Signed)
[place] EMERGENCY DEPARTMENT Provider Note   CSN: [id number] Arrival date & time: 11/06/22  1327     History {Add pertinent medical, surgical, social history, OB history to HPI:1} No chief complaint on file.   Tina Olson is a 76 y.o. female.  76 year old female with prior medical history as detailed below presents for evaluation.  Patient was called by Dr. Johnnye Sima (ID) and advised that her hemoglobin was 6.4.  Patient was advised to come to the ED for evaluation.  Patient reports that she has intermittent dizziness.  She has that she feels somewhat fatigued more so than her baseline.  Patient reports 2-3 episodes of bright red blood with wiping after BMs over the last 2 to 3 days.  Patient has a longstanding history of hemorrhoids.  She denies any dark tarry stool.  She denies any melena.  She denies any recent use of nonsteroidals and/or alcohol use.  Patient is otherwise comfortable.  She denies associated chest pain, shortness of breath, nausea, vomiting, or other complaint.  The history is provided by the patient and medical records.       Home Medications Prior to Admission medications   Medication Sig Start Date End Date Taking? Authorizing Provider  acetaminophen (TYLENOL) 325 MG tablet Take 325 mg by mouth daily as needed for moderate pain or headache.    [provider]  amLODipine (NORVASC) 10 MG tablet Take 10 mg by mouth daily. 04/13/21   [provider]  aspirin 81 MG chewable tablet Chew 1 tablet by mouth daily.    [provider]  azaTHIOprine (IMURAN) 50 MG tablet Take 25 mg by mouth daily. 07/08/20   [provider]  BIOTIN PO Take 500 mcg by mouth daily.    [provider]  calcium carbonate (OSCAL) 1500 (600 Ca) MG TABS tablet Take 1,500 mg by mouth daily with breakfast.    [provider]  docusate sodium (COLACE) 100 MG capsule Take 100 mg by mouth as needed for mild  constipation.    [provider]  fluticasone (FLONASE) 50 MCG/ACT nasal spray Place 1 spray into both nostrils daily as needed for allergies or rhinitis.    [provider]  Omega-3 Fatty Acids (FISH OIL) 1200 MG CAPS Take 1,200 mg by mouth daily.    [provider]  polyethylene glycol (MIRALAX / GLYCOLAX) packet Take 17 g by mouth daily as needed for mild constipation.     [provider]  rosuvastatin (CRESTOR) 20 MG tablet Take 20 mg by mouth in the morning. 05/21/22   [provider]  Tiotropium Bromide Monohydrate (SPIRIVA RESPIMAT) 1.25 MCG/ACT AERS Inhale 2 puffs into the lungs daily. 10/09/22   Brand Males, MD  valACYclovir (VALTREX) 1000 MG tablet Take 1,000 mg by mouth daily.  12/22/19   [provider]      Allergies    Iodinated contrast media, Lisinopril, and Other    Review of Systems   Review of Systems  All other systems reviewed and are negative.   Physical Exam Updated Vital Signs BP 124/60   Pulse 68   Temp 98.6 F (37 C)   Resp 16   SpO2 100%  Physical Exam Vitals and nursing note reviewed.  Constitutional:      General: She is not in acute distress.    Appearance: Normal appearance. She is well-developed.  HENT:     Head: Normocephalic and atraumatic.  Eyes:     Conjunctiva/sclera: Conjunctivae normal.  Pupils: Pupils are equal, round, and reactive to light.  Cardiovascular:     Rate and Rhythm: Normal rate and regular rhythm.     Heart sounds: Normal heart sounds.  Pulmonary:     Effort: Pulmonary effort is normal. No respiratory distress.     Breath sounds: Normal breath sounds.  Abdominal:     General: There is no distension.     Palpations: Abdomen is soft.     Tenderness: There is no abdominal tenderness.  Genitourinary:    Comments: No gross melena or bleeding seen on DRE.  Mild external hemorrhoids noted.  No thrombosis appreciated.  No active bleeding seen.  Patient's stool  guaiac is negative. Musculoskeletal:        General: No deformity. Normal range of motion.     Cervical back: Normal range of motion and neck supple.  Skin:    General: Skin is warm and dry.  Neurological:     General: No focal deficit present.     Mental Status: She is alert and oriented to person, place, and time.     ED Results / Procedures / Treatments   Labs (all labs ordered are listed, but only abnormal results are displayed) Labs Reviewed  CBC WITH DIFFERENTIAL/PLATELET  LIPASE, BLOOD  COMPREHENSIVE METABOLIC PANEL  POC OCCULT BLOOD, ED  TYPE AND SCREEN    EKG None  Radiology No results found.  Procedures Procedures  {Document cardiac monitor, telemetry assessment procedure when appropriate:1}  Medications Ordered in ED Medications - No data to display  ED Course/ Medical Decision Making/ A&P                           Medical Decision Making Amount and/or Complexity of Data Reviewed Labs: ordered.    Medical Screen Complete  This patient presented to the ED with complaint of suspected anemia..  This complaint involves an extensive number of treatment options. The initial differential diagnosis includes, but is not limited to, anemia, GI bleed, metabolic abnormality, etc.  This presentation is: Acute, Chronic, Self-Limited, Previously Undiagnosed, Uncertain Prognosis, Complicated, Systemic Symptoms, and Threat to Life/Bodily Function  Patient is sent to the ED for evaluation of possible anemia.  Patient's reported hemoglobin in the outpatient setting was 6.4.  Patient is endorsing mild fatigue.  She does report some intermittent minimal bleeding associated with straining during BMs.  She does have established history of hemorrhoids.  Patient's stool guaiac is negative.  Patient without active bleeding noted on rectal exam.  Screening labs ordered.  Pending labs and dispo signed out to oncoming EDP - [place].    Additional history  obtained:  External records from outside sources obtained and reviewed including prior ED visits and prior Inpatient records.    Lab Tests:  I ordered and personally interpreted labs.  The pertinent results include: CBC, CMP, lipase, type and screen   Cardiac Monitoring:  The patient was maintained on a cardiac monitor.  I personally viewed and interpreted the cardiac monitor which showed an underlying rhythm of: ***   Medicines ordered:  I ordered medication including ***  for ***  Reevaluation of the patient after these medicines showed that the patient: {resolved/improved/worsened:23923::"improved"}    Test Considered:  ***   Critical Interventions:  ***   Consultations Obtained:  I consulted ***,  and discussed lab and imaging findings as well as pertinent plan of care.    Problem List / ED Course:  ***  Reevaluation:  After the interventions noted above, I reevaluated the patient and found that they have: {resolved/improved/worsened:23923::"improved"}   Social Determinants of Health:  ***   Disposition:  After consideration of the diagnostic results and the patients response to treatment, I feel that the patent would benefit from ***.    {Document critical care time when appropriate:1} {Document review of labs and clinical decision tools ie heart score, Chads2Vasc2 etc:1}  {Document your independent review of radiology images, and any outside records:1} {Document your discussion with family members, caretakers, and with consultants:1} {Document social determinants of health affecting pt's care:1} {Document your decision making why or why not admission, treatments were needed:1} Final Clinical Impression(s) / ED Diagnoses Final diagnoses:  None    Rx / DC Orders ED Discharge Orders     None

## 2022-11-06 NOTE — ED Notes (Signed)
Attempted x2 times for IV access and to obtain labs that were ordered. Unsuccessful attempts, put in order for IV team to come place PIV

## 2022-11-06 NOTE — Telephone Encounter (Signed)
Received call from main lab Hgb 6.4  Call to Dr Ninetta Lights Pt instructed to go to ED for evaluation Pt agreed.  Of note, pt did complain of being short of breath and is currently dealing with "bleeding hemorrhoids"

## 2022-11-06 NOTE — Addendum Note (Signed)
Addended by: Bufford Spikes on: 11/06/2022 09:15 AM   Modules accepted: Orders

## 2022-11-06 NOTE — ED Provider Triage Note (Signed)
Emergency Medicine Provider Triage Evaluation Note  Tina Olson , a 76 y.o. female  was evaluated in triage.  Patient is sent here by PCP for blood transfusion.  Noted a hemoglobin of 6.4.  Patient says that she is dizzy when she bends over and feels very fatigued.  Says that she has noted some bright red blood that she thinks is from bleeding hemorrhoids.  No melena.  No NSAIDs or alcohol use.  Reports that she had to have a blood transfusion a couple years ago as well.  Per chart review, history of HIV, labs pulled by pcp Review of Systems  Positive:  Negative:   Physical Exam  BP 116/60   Pulse 62   Temp 98.9 F (37.2 C)   Resp 16   SpO2 100%  Gen:   Awake, no distress   Resp:  Normal effort  MSK:   Moves extremities without difficulty  Other:  RRR, lung sounds clear  Medical Decision Making  Medically screening exam initiated at 2:23 PM.  Appropriate orders placed.  [name] was informed that the remainder of the evaluation will be completed by another provider, this initial triage assessment does not replace that evaluation, and the importance of remaining in the ED until their evaluation is complete.     Saddie Benders, PA-C 11/06/22 1425

## 2022-11-07 ENCOUNTER — Encounter (HOSPITAL_COMMUNITY): Payer: Self-pay | Admitting: Internal Medicine

## 2022-11-07 DIAGNOSIS — I10 Essential (primary) hypertension: Secondary | ICD-10-CM | POA: Diagnosis not present

## 2022-11-07 DIAGNOSIS — D649 Anemia, unspecified: Secondary | ICD-10-CM | POA: Diagnosis not present

## 2022-11-07 DIAGNOSIS — Z21 Asymptomatic human immunodeficiency virus [HIV] infection status: Secondary | ICD-10-CM

## 2022-11-07 DIAGNOSIS — E785 Hyperlipidemia, unspecified: Secondary | ICD-10-CM | POA: Diagnosis present

## 2022-11-07 DIAGNOSIS — D72819 Decreased white blood cell count, unspecified: Secondary | ICD-10-CM

## 2022-11-07 DIAGNOSIS — E782 Mixed hyperlipidemia: Secondary | ICD-10-CM

## 2022-11-07 LAB — COMPREHENSIVE METABOLIC PANEL
ALT: 11 U/L (ref 0–44)
ALT: 11 U/L (ref 0–44)
ALT: 12 IU/L (ref 0–32)
AST: 19 U/L (ref 15–41)
AST: 20 IU/L (ref 0–40)
AST: 20 U/L (ref 15–41)
Albumin/Globulin Ratio: 1.2 (ref 1.2–2.2)
Albumin: 3.2 g/dL — ABNORMAL LOW (ref 3.5–5.0)
Albumin: 3.3 g/dL — ABNORMAL LOW (ref 3.5–5.0)
Albumin: 4.6 g/dL (ref 3.8–4.8)
Alkaline Phosphatase: 30 U/L — ABNORMAL LOW (ref 38–126)
Alkaline Phosphatase: 33 U/L — ABNORMAL LOW (ref 38–126)
Alkaline Phosphatase: 43 IU/L — ABNORMAL LOW (ref 44–121)
Anion gap: 8 (ref 5–15)
Anion gap: 9 (ref 5–15)
BUN/Creatinine Ratio: 13 (ref 12–28)
BUN: 16 mg/dL (ref 8–27)
BUN: 19 mg/dL (ref 8–23)
BUN: 22 mg/dL (ref 8–23)
Bilirubin Total: 0.5 mg/dL (ref 0.0–1.2)
CO2: 20 mmol/L — ABNORMAL LOW (ref 22–32)
CO2: 21 mmol/L (ref 20–29)
CO2: 22 mmol/L (ref 22–32)
Calcium: 9.1 mg/dL (ref 8.9–10.3)
Calcium: 9.2 mg/dL (ref 8.9–10.3)
Calcium: 9.9 mg/dL (ref 8.7–10.3)
Chloride: 103 mmol/L (ref 96–106)
Chloride: 110 mmol/L (ref 98–111)
Chloride: 110 mmol/L (ref 98–111)
Creatinine, Ser: 0.9 mg/dL (ref 0.44–1.00)
Creatinine, Ser: 1.13 mg/dL — ABNORMAL HIGH (ref 0.44–1.00)
Creatinine, Ser: 1.25 mg/dL — ABNORMAL HIGH (ref 0.57–1.00)
GFR, Estimated: 51 mL/min — ABNORMAL LOW (ref >60)
GFR, Estimated: >60 mL/min (ref >60)
Globulin, Total: 3.7 g/dL (ref 1.5–4.5)
Glucose, Bld: 112 mg/dL — ABNORMAL HIGH (ref 70–99)
Glucose, Bld: 96 mg/dL (ref 70–99)
Glucose: 101 mg/dL — ABNORMAL HIGH (ref 70–99)
Potassium: 3.5 mmol/L (ref 3.5–5.1)
Potassium: 3.7 mmol/L (ref 3.5–5.1)
Potassium: 4.2 mmol/L (ref 3.5–5.2)
Sodium: 138 mmol/L (ref 134–144)
Sodium: 138 mmol/L (ref 135–145)
Sodium: 141 mmol/L (ref 135–145)
Total Bilirubin: 0.5 mg/dL (ref 0.3–1.2)
Total Bilirubin: 1.3 mg/dL — ABNORMAL HIGH (ref 0.3–1.2)
Total Protein: 6.9 g/dL (ref 6.5–8.1)
Total Protein: 7.3 g/dL (ref 6.5–8.1)
Total Protein: 8.3 g/dL (ref 6.0–8.5)
eGFR: 45 mL/min/{1.73_m2} — ABNORMAL LOW (ref >59)

## 2022-11-07 LAB — T-HELPER CELL (CD4) - (RCID CLINIC ONLY)
CD4 % Helper T Cell: 35 % (ref 33–65)
CD4 T Cell Abs: 313 /uL — ABNORMAL LOW (ref 400–1790)

## 2022-11-07 LAB — CBC WITH DIFFERENTIAL/PLATELET
Abs Immature Granulocytes: 0 10*3/uL (ref 0.00–0.07)
Abs Immature Granulocytes: 0.01 10*3/uL (ref 0.00–0.07)
Basophils Absolute: 0 10*3/uL (ref 0.0–0.1)
Basophils Absolute: 0 10*3/uL (ref 0.0–0.1)
Basophils Relative: 0 %
Basophils Relative: 1 %
Eosinophils Absolute: 0 10*3/uL (ref 0.0–0.5)
Eosinophils Absolute: 0 10*3/uL (ref 0.0–0.5)
Eosinophils Relative: 1 %
Eosinophils Relative: 1 %
HCT: 19 % — ABNORMAL LOW (ref 36.0–46.0)
HCT: 23.2 % — ABNORMAL LOW (ref 36.0–46.0)
Hemoglobin: 5.6 g/dL — CL (ref 12.0–15.0)
Hemoglobin: 7 g/dL — ABNORMAL LOW (ref 12.0–15.0)
Immature Granulocytes: 0 %
Immature Granulocytes: 0 %
Lymphocytes Relative: 27 %
Lymphocytes Relative: 32 %
Lymphs Abs: 0.8 10*3/uL (ref 0.7–4.0)
Lymphs Abs: 1 10*3/uL (ref 0.7–4.0)
MCH: 23.1 pg — ABNORMAL LOW (ref 26.0–34.0)
MCH: 24.2 pg — ABNORMAL LOW (ref 26.0–34.0)
MCHC: 29.5 g/dL — ABNORMAL LOW (ref 30.0–36.0)
MCHC: 30.2 g/dL (ref 30.0–36.0)
MCV: 78.5 fL — ABNORMAL LOW (ref 80.0–100.0)
MCV: 80.3 fL (ref 80.0–100.0)
Monocytes Absolute: 0.4 10*3/uL (ref 0.1–1.0)
Monocytes Absolute: 0.5 10*3/uL (ref 0.1–1.0)
Monocytes Relative: 14 %
Monocytes Relative: 16 %
Neutro Abs: 1.5 10*3/uL — ABNORMAL LOW (ref 1.7–7.7)
Neutro Abs: 1.7 10*3/uL (ref 1.7–7.7)
Neutrophils Relative %: 51 %
Neutrophils Relative %: 57 %
Platelets: 156 10*3/uL (ref 150–400)
Platelets: 170 10*3/uL (ref 150–400)
RBC: 2.42 MIL/uL — ABNORMAL LOW (ref 3.87–5.11)
RBC: 2.89 MIL/uL — ABNORMAL LOW (ref 3.87–5.11)
RDW: 17.9 % — ABNORMAL HIGH (ref 11.5–15.5)
RDW: 18.4 % — ABNORMAL HIGH (ref 11.5–15.5)
WBC: 3 10*3/uL — ABNORMAL LOW (ref 4.0–10.5)
WBC: 3.1 10*3/uL — ABNORMAL LOW (ref 4.0–10.5)
nRBC: 0 % (ref 0.0–0.2)
nRBC: 0 % (ref 0.0–0.2)

## 2022-11-07 LAB — RETICULOCYTES
Immature Retic Fract: 13.4 % (ref 2.3–15.9)
RBC.: 2.39 MIL/uL — ABNORMAL LOW (ref 3.87–5.11)
Retic Count, Absolute: 20.3 10*3/uL (ref 19.0–186.0)
Retic Ct Pct: 0.9 % (ref 0.4–3.1)

## 2022-11-07 LAB — HEMOGLOBIN AND HEMATOCRIT, BLOOD
HCT: 30 % — ABNORMAL LOW (ref 36.0–46.0)
Hemoglobin: 9.2 g/dL — ABNORMAL LOW (ref 12.0–15.0)

## 2022-11-07 LAB — MAGNESIUM: Magnesium: 1.5 mg/dL — ABNORMAL LOW (ref 1.7–2.4)

## 2022-11-07 LAB — RPR: RPR Ser Ql: NONREACTIVE

## 2022-11-07 LAB — LIPASE, BLOOD: Lipase: 64 U/L — ABNORMAL HIGH (ref 11–51)

## 2022-11-07 LAB — LIPID PANEL
Chol/HDL Ratio: 2.2 ratio (ref 0.0–4.4)
Cholesterol, Total: 114 mg/dL (ref 100–199)
HDL: 52 mg/dL (ref >39)
LDL Chol Calc (NIH): 48 mg/dL (ref 0–99)
Triglycerides: 64 mg/dL (ref 0–149)
VLDL Cholesterol Cal: 14 mg/dL (ref 5–40)

## 2022-11-07 LAB — PREPARE RBC (CROSSMATCH)

## 2022-11-07 LAB — TECHNOLOGIST SMEAR REVIEW

## 2022-11-07 LAB — FOLATE: Folate: 26.4 ng/mL (ref >5.9)

## 2022-11-07 LAB — URINE CYTOLOGY ANCILLARY ONLY
Chlamydia: NEGATIVE
Comment: NEGATIVE
Comment: NORMAL
Neisseria Gonorrhea: NEGATIVE

## 2022-11-07 MED ORDER — FOLIC ACID 1 MG PO TABS: 1.0000 mg | ORAL_TABLET | Freq: Every day | ORAL | 0 refills | Status: AC

## 2022-11-07 MED ORDER — MAGNESIUM SULFATE 2 GM/50ML IV SOLN
2.0000 g | Freq: Once | INTRAVENOUS | Status: AC
  Administered 2022-11-07: 2 g via INTRAVENOUS
  Filled 2022-11-07: qty 50

## 2022-11-07 MED ORDER — SODIUM CHLORIDE 0.9 % IV SOLN
10.0000 mL/h | Freq: Once | INTRAVENOUS | Status: AC
  Administered 2022-11-07: 10 mL/h via INTRAVENOUS

## 2022-11-07 MED ORDER — ACETAMINOPHEN 325 MG PO TABS: 650.0000 mg | ORAL_TABLET | Freq: Four times a day (QID) | ORAL | Status: DC | PRN

## 2022-11-07 MED ORDER — MAGNESIUM OXIDE 400 MG PO CAPS: 1.0000 | ORAL_CAPSULE | Freq: Two times a day (BID) | ORAL | 0 refills | Status: AC

## 2022-11-07 MED ORDER — ACETAMINOPHEN 650 MG RE SUPP: 650.0000 mg | Freq: Four times a day (QID) | RECTAL | Status: DC | PRN

## 2022-11-07 MED ORDER — AZATHIOPRINE 50 MG PO TABS
25.0000 mg | ORAL_TABLET | Freq: Every day | ORAL | Status: DC
  Administered 2022-11-07: 25 mg via ORAL
  Filled 2022-11-07: qty 1

## 2022-11-07 MED ORDER — UMECLIDINIUM BROMIDE 62.5 MCG/ACT IN AEPB
1.0000 | INHALATION_SPRAY | Freq: Every day | RESPIRATORY_TRACT | Status: DC
  Filled 2022-11-07: qty 7

## 2022-11-07 MED ORDER — MELATONIN 3 MG PO TABS: 3.0000 mg | ORAL_TABLET | Freq: Every evening | ORAL | Status: DC | PRN

## 2022-11-07 MED ORDER — FERROUS SULFATE 325 (65 FE) MG PO TABS: 325.0000 mg | ORAL_TABLET | Freq: Every day | ORAL | 0 refills | Status: DC

## 2022-11-07 MED ORDER — ROSUVASTATIN CALCIUM 20 MG PO TABS
20.0000 mg | ORAL_TABLET | Freq: Every morning | ORAL | Status: DC
  Administered 2022-11-07: 20 mg via ORAL
  Filled 2022-11-07: qty 1

## 2022-11-07 NOTE — ED Notes (Signed)
Patient tolerating blood transfusion well at this time. No s/s of any adverse reactions at this time. Patient resting on ED stretcher, no questions at this time.

## 2022-11-07 NOTE — H&P (Signed)
History and Physical      Tina Olson [id number] DOB: Dec 24, 1945 DOA: 11/06/2022  PCP: Sharmon Revere, MD  Patient coming from: home   I have personally briefly reviewed patient's old medical records in [facility]  Chief Complaint:   HPI: Tina Olson is a 76 y.o. female with medical history significant for HIV, hepatitis C status posttreatment with Harvoni, chronic anemia associated baseline hemoglobin range 9-11, essential hypertension, hyperlipidemia, who is admitted to [facility] on 11/06/2022 with acute on chronic anemia after presenting from home to [facility] ED for evaluation of outpatient labs reflect interval decline in hemoglobin.   The context of a history of HIV, originally diagnosed in 1993, the patient follows with Dr. Ninetta Lights as her outpatient infectious disease physician.  She was undergoing associated routine outpatient labs yesterday, 10/29/2022, which demonstrated interval decline in hemoglobin to 6.4.  This is relative to her documented history of chronic anemia associated with baseline hemoglobin range 9-11, with most recent prior hemoglobin value noted to be 10.3 in June 2022.  As a consequence of this interval decline in hemoglobin, the patient was instructed to present to the emergency department for further evaluation and management thereof.  While she denies any recent melena or hematochezia.  No recent abdominal pain, nausea, vomiting, hematemesis.  No recent hemoptysis or gross hematuria.  She is on a daily baby aspirin at home, but otherwise on no blood thinning agents.  No known history of gastrointestinal bleed.   Per patient, most recent colonoscopy occurred in 2021 at [facility]. She conveys that, based upon the results of this colonoscopy that she was told she would not be due for her next screening colonoscopy for 10 years.   Denies any routine or recent use of NSAIDs.  No routine or recent consumption of alcohol.  She has a  history of hepatitis C infection, status post treatment with Harvoni.  No known history of chronic liver disease.  Over the last month, the patient reports some dizziness, in the absence of presyncope, syncope, or fall.  She also notes mild shortness of breath over that timeframe, in the absence of chest pain, potation's, diaphoresis, presyncope, or syncope.  Denies any new onset cough, orthopnea, PND.  No recent subjective fever, chills, rigors, or generalized myalgias.  No new rash.  Per chart review, her baseline creatinine range appears to be 0.9-1.1.  She also has history of chronic leukopenia, with baseline white blood cell count noted to be 2.4-3.2.      ED Course:  Vital signs in the ED were notable for the following: Afebrile; heart rate 60 to 70s; systolic blood pressures in the low 100s to 120s mmHg;  respiratory rate 16, oxygen saturation 97 to 100% on room air.  Labs were notable for the following: CMP notable for the following: BUN 22 compared to most recent prior value of 15 in July 2023, creatinine 1.13, calcium, adjusted for mild hypoalbuminemia noted to be 9.8, avidin 3.3, liver enzymes within normal limits.  CBC notable for will with cell count 3.0, hemoglobin 5.6 associated with microcytic/hypochromic findings as well as elevated RDW, platelet count 170.  Per my interpretation, EKG in ED demonstrated the following: Sinus rhythm with heart rate 63, normal intervals, no evidence of T wave or ST changes, including no evidence of ST elevation.  EDP performed DRE, which was associated with finding of brown-colored stool without evidence of melena or gross blood, with fecal occult blood test negative.  While in  the ED, EDP ordered transfusion of 2 units PBC.  Subsequently, the patient was admitted for further evaluation management of acute on chronic anemia.     Review of Systems: As per HPI otherwise 10 point review of systems negative.   Past Medical History:  Diagnosis  Date   Acid reflux    Anemia 2017   Difficult intravenous access    Hepatitis C 07/2014   took harvani tx    History of blood transfusion 2017   2 units   HIV infection (HCC) since 1993   Hypertension    Internal hemorrhoid    Right inguinal hernia     Past Surgical History:  Procedure Laterality Date   BREAST BIOPSY Right 07/26/2008   CATARACT EXTRACTION Right 2016   CATARACT EXTRACTION Left 06/2019   COLONOSCOPY WITH PROPOFOL N/A 06/11/2017   Procedure: COLONOSCOPY WITH PROPOFOL;  Surgeon: Napoleon Form, MD;  Location: [facility] ENDOSCOPY;  Service: Endoscopy;  Laterality: N/A;  PT IS KNOWN HEART STICK. WILL PROBABLY REQUIRE ULTRASOUND OR ANESTHESIA FOR IV STICK.   ESOPHAGOGASTRODUODENOSCOPY (EGD) WITH PROPOFOL N/A 06/11/2017   Procedure: ESOPHAGOGASTRODUODENOSCOPY (EGD) WITH PROPOFOL;  Surgeon: Napoleon Form, MD;  Location: [facility] ENDOSCOPY;  Service: Endoscopy;  Laterality: N/A;    Social History:  reports that she quit smoking about 13 years ago. Her smoking use included cigarettes. She has a 33.75 pack-year smoking history. She has never used smokeless tobacco. She reports that she does not drink alcohol and does not use drugs.   Allergies  Allergen Reactions   Iodinated Contrast Media Hives and Itching   Lisinopril Itching and Cough   Other Hives and Itching     Seafood crustaceans  "itchy throat"    Family History  Problem Relation Age of Onset   Alzheimer's disease Mother    Glaucoma Mother    Hypertension Mother    Cancer Father        ? type   Seizures Son     Family history reviewed and not pertinent    Prior to Admission medications   Medication Sig Start Date End Date Taking? Authorizing Provider  acetaminophen (TYLENOL) 325 MG tablet Take 325 mg by mouth daily as needed for moderate pain or headache.    [provider]  amLODipine (NORVASC) 10 MG tablet Take 10 mg by mouth daily. 04/13/21   [provider]  aspirin 81 MG chewable  tablet Chew 1 tablet by mouth daily.    [provider]  azaTHIOprine (IMURAN) 50 MG tablet Take 25 mg by mouth daily. 07/08/20   [provider]  BIOTIN PO Take 500 mcg by mouth daily.    [provider]  calcium carbonate (OSCAL) 1500 (600 Ca) MG TABS tablet Take 1,500 mg by mouth daily with breakfast.    [provider]  docusate sodium (COLACE) 100 MG capsule Take 100 mg by mouth as needed for mild constipation.    [provider]  fluticasone (FLONASE) 50 MCG/ACT nasal spray Place 1 spray into both nostrils daily as needed for allergies or rhinitis.    [provider]  Omega-3 Fatty Acids (FISH OIL) 1200 MG CAPS Take 1,200 mg by mouth daily.    [provider]  polyethylene glycol (MIRALAX / GLYCOLAX) packet Take 17 g by mouth daily as needed for mild constipation.     [provider]  rosuvastatin (CRESTOR) 20 MG tablet Take 20 mg by mouth in the morning. 05/21/22   [provider]  Tiotropium Bromide Monohydrate (SPIRIVA RESPIMAT) 1.25 MCG/ACT AERS Inhale 2 puffs into the lungs daily. 10/09/22   Kalman Shan, MD  valACYclovir (VALTREX) 1000 MG tablet Take 1,000 mg by mouth daily.  12/22/19   [provider]     Objective    Physical Exam: Vitals:   11/07/22 0300 11/07/22 0312 11/07/22 0322 11/07/22 0327  BP: (!) 113/55 130/63 (!) 135/92 136/62  Pulse: (!) 56 61 (!) 53 (!) 59  Resp:  Temp:  97.9 F (36.6 C) 97.9 F (36.6 C) 97.9 F (36.6 C)  TempSrc:  Oral Oral Oral  SpO2: 97% 100% 100% 100%    General: appears to be stated age; alert, oriented Skin: warm, dry, no rash Head:  AT/[place] Mouth:  Oral mucosa membranes appear moist, normal dentition Neck: supple; trachea midline Heart:  RRR; did not appreciate any M/R/G Lungs: CTAB, did not appreciate any wheezes, rales, or rhonchi Abdomen: + BS; soft, ND, NT Vascular: 2+ pedal pulses b/l; 2+ radial pulses b/l Extremities: no  peripheral edema, no muscle wasting Neuro: strength and sensation intact in upper and lower extremities b/l    Labs on Admission: I have personally reviewed following labs and imaging studies  CBC: Recent Labs  Lab 11/06/22 0930 11/07/22 0010  WBC 2.6* 3.0*  NEUTROABS  --  1.5*  HGB 6.4* 5.6*  HCT 22.4* 19.0*  MCV 79.2* 78.5*  PLT 194 170   Basic Metabolic Panel: Recent Labs  Lab 11/07/22 0010  NA 141  K 3.5  CL 110  CO2 22  GLUCOSE 112*  BUN 22  CREATININE 1.13*  CALCIUM 9.2   GFR: CrCl cannot be calculated (Unknown ideal weight.). Liver Function Tests: Recent Labs  Lab 11/07/22 0010  AST 20  ALT 11  ALKPHOS 33*  BILITOT 0.5  PROT 7.3  ALBUMIN 3.3*   Recent Labs  Lab 11/07/22 0010  LIPASE 64*   No results for input(s): "AMMONIA" in the last 168 hours. Coagulation Profile: No results for input(s): "INR", "PROTIME" in the last 168 hours. Cardiac Enzymes: No results for input(s): "CKTOTAL", "CKMB", "CKMBINDEX", "TROPONINI" in the last 168 hours. BNP (last 3 results) No results for input(s): "PROBNP" in the last 8760 hours. HbA1C: No results for input(s): "HGBA1C" in the last 72 hours. CBG: No results for input(s): "GLUCAP" in the last 168 hours. Lipid Profile: No results for input(s): "CHOL", "HDL", "LDLCALC", "TRIG", "CHOLHDL", "LDLDIRECT" in the last 72 hours. Thyroid Function Tests: No results for input(s): "TSH", "T4TOTAL", "FREET4", "T3FREE", "THYROIDAB" in the last 72 hours. Anemia Panel: No results for input(s): "VITAMINB12", "FOLATE", "FERRITIN", "TIBC", "IRON", "RETICCTPCT" in the last 72 hours. Urine analysis:    Component Value Date/Time   COLORURINE YELLOW 10/08/2017 1908   APPEARANCEUR CLEAR 10/08/2017 1908   LABSPEC 1.010 10/08/2017 1908   PHURINE 6.0 10/08/2017 1908   GLUCOSEU NEGATIVE 10/08/2017 1908   HGBUR SMALL (A) 10/08/2017 1908   BILIRUBINUR NEGATIVE 10/08/2017 1908   KETONESUR NEGATIVE 10/08/2017 1908   PROTEINUR  NEGATIVE 10/08/2017 1908   UROBILINOGEN 0.2 10/25/2014 1514   NITRITE NEGATIVE 10/08/2017 1908   LEUKOCYTESUR NEGATIVE 10/08/2017 1908    Radiological Exams on Admission: No results found.    Assessment/Plan   Principal Problem:   Acute on chronic anemia Active Problems:   HIV (human immunodeficiency virus infection) (HCC)   Essential hypertension   Chronic leukopenia   HLD (hyperlipidemia)     #) Acute on chronic anemia: Relative to baseline hemoglobin range of 9-11,  routine outpatient labs performed earlier in the day noted interval decline in hemoglobin to 6.4, prompting recommendation for the patient to be further evaluated in the ED. relative to her suspected anemia of chronic disease, etiology of the patient's worsening anemia not entirely clear at this time.  No overt evidence of contribution from acute blood loss, noting DRE by EDP today, which showed no evidence of gross blood, will also noting fecal occult blood negative finding.  On daily baby aspirin as an outpatient, but otherwise no additional blood thinners at home.  Renal function appears at baseline.  Differential includes potential pharmacologic contributions in the context of HIV, although the specific constituents of her her antiviral therapy are not entirely clear to me at this time.  No known history of chronic liver disease, while noting a prior history of hepatitis C status post Harvoni treatment.  Will check INR to further evaluate synthetic hepatic function.  Will further expand laboratory evaluation of patient's anemia, with additional laboratory tests as indicated below.  Preliminarily, hemolytic process felt to be less likely, without presenting labs demonstrating any evidence of hyperbilirubinemia.  Platelet count within normal limits.  In terms of timeframe of her exacerbation of chronic anemia, suspect that this may be more subacute in onset, given her stable appearing vital signs, without evidence of  tachycardia, also noting normotensive blood pressures.  Minimal symptoms also suggest a more subacute timeframe.  She has started on transfusion of 2 units PRBC in the ED.   Plan: Continue transfusion 2 units PRBC ordered in ED today.  Post transfusion H&H ordered.  Repeat CBC in the morning.  Acute 4-hour H&H's ordered through 1300 on 11/07/2022.  Monitor on symmetry.  Add on the following laboratory studies to pretransfusion specimen: Iron studies, B12 level, folic acid level, reticulocyte count, INR, PTT.  Add on technologist smear review.  Repeat CMP in the morning.  Holding home daily baby aspirin for now.  Refraining from pharmacologic DVT prophylaxis for now.  Hold home amlodipine for now.          #) HIV: Documented history of such, which review revealing diagnosis in 1993.  Follows with Dr.**Of infectious disease as outpatient.  Of note, outpatient labs earlier today included checking a CD4 count as well as HIV viral load, although both of these results are currently pending at this time.  Per chart review, prior to that, most recent prior CD4 count was checked in March 2023 and found to be 388, while HIV viral load via log rhythmic scale in March 2023 found to be 1.48, trending down from 2.07 in June 2022.  Associated with her chronic leukopenia with baseline white blood cell count of 2500 - 3200, with presenting wbc c/w this baseline.  As noted above, current outpatient antiviral regimen not entirely clear to me at this time.  Will attempt additional chart review to ascertain the specifics, particularly given potential role of pharmacologic contributions towards her presenting acute/subacute anemia superimposed on chronic anemia.   Plan: Repeat CBC in the morning.  Additional chart review to gain insight into her outpatient antiviral regimen.            #) Essential Hypertension: documented h/o such, with outpatient antihypertensive regimen including amlodipine.  SBP's in the ED  today: Low 100s to 120s mmHg. however, in the setting of her presenting acute on chronic anemia, will hold home amlodipine for now.  Plan: Close monitoring of subsequent BP via routine VS. hold home amlodipine for now, as  above.                #) Hyperlipidemia: documented h/o such. On high intensity rosuvastatin as outpatient.   Plan: continue home statin.  Add on INR.         DVT prophylaxis: SCD's   Code Status: Full code Family Communication: none Disposition Plan: Per Rounding Team Consults called: none;  Admission status: Observation     I SPENT GREATER THAN 75  MINUTES IN CLINICAL CARE TIME/MEDICAL DECISION-MAKING IN COMPLETING THIS ADMISSION.      Chaney Born Eva Griffo DO [facility]  From 7PM - 7AM   11/07/2022, 3:31 AM

## 2022-11-07 NOTE — Hospital Course (Addendum)
76 y.o. female with h/o HIV, hepatitis C s/p tx with Harvoni, chronic anemia associated baseline hemoglobin range 9-11 who was found to have low hemoglobin on routine outpatient lab and sent to the ED. In the ED hemodynamically stable, rectal exam by EDP showed brown stool FOBT negative, 2 units PRBC ordered and admitted.  Patient was transfused 2 unit PRBC with improvement in her symptoms.  At this time remains stable, no complaint, wants to go home today.

## 2022-11-07 NOTE — ED Notes (Signed)
Patient still tolerating blood transfusion well, no signs of any adverse reactions. Vital signs remain stable, airway intact. Patient is resting comfortably on ED stretcher. Patient educated that if she feels any different to let this RN know immediately, patient verbalizes understanding. No other needs at this time.

## 2022-11-07 NOTE — ED Notes (Signed)
Horton, MD notified of patient's low hemoglobin. Blood transfusion has been ordered.

## 2022-11-07 NOTE — ED Notes (Signed)
Pt tolerated blood without signs or symptoms or transfusion reaction.

## 2022-11-07 NOTE — Discharge Summary (Signed)
Physician Discharge Summary  Tina Olson GYI:[id number] DOB: 09/15/1946 DOA: 11/06/2022  PCP: Sharmon Revere, MD  Admit date: 11/06/2022 Discharge date: 11/07/2022 Recommendations for Outpatient Follow-up:  Follow up with PCP in 1 weeks-call for appointment Please obtain BMP/CBC in one week  Discharge Dispo: home Discharge Condition: Stable Code Status:   Code Status: Full Code Diet recommendation:  Diet Order             Diet regular Room service appropriate? Yes; Fluid consistency: Thin  Diet effective now           Diet - low sodium heart healthy                  Brief/Interim Summary: 76 y.o. female with h/o HIV, hepatitis C s/p tx with Harvoni, chronic anemia associated baseline hemoglobin range 9-11 who was found to have low hemoglobin on routine outpatient lab and sent to the ED. In the ED hemodynamically stable, rectal exam by EDP showed brown stool FOBT negative, 2 units PRBC ordered and admitted.  Patient was transfused 2 unit PRBC with improvement in her symptoms.  At this time remains stable, no complaint, wants to go home today.   Discharge Diagnoses:  Principal Problem:   Acute on chronic anemia Active Problems:   HIV (human immunodeficiency virus infection) (HCC)   Essential hypertension   Chronic leukopenia   HLD (hyperlipidemia)  Anemia acute on chronic as low as 5.6 g> improved to 7 g after 1 unit PRBC.  Recheck H&H 2 hours post transfusion.  FOBT negative in the ED. follow-up anemia panel with ferritin folate B12-advised outpatient follow-up, patient does not to stay any longer, H&H rechecked this afternoon if stable and increased appropriately she will be discharged home Recent Labs  Lab 11/06/22 0930 11/07/22 0010 11/07/22 0558 11/07/22 1247  HGB 6.4* 5.6* 7.0* 9.2*  HCT 22.4* 19.0* 23.2* 30.0*    Leukopenia appears chronic, in the setting of HIV  Hypomagnesemia we will replete Mildly elevated TB monitor LFTs stable HIV history  followed by Dr. Ninetta Lights: Continue outpatient follow-up Hypertension: BP soft Home blood on hold HLD continue her Crestor Hypomagnesemia-repleted.  Consults: none Subjective: Alert awake oriented resting comfortably feels improved.  Wants to go home today.  Waiting for repeat labs.   Discharge Exam: Vitals:   11/07/22 1353 11/07/22 1400  BP: (!) 131/50 (!) 127/59  Pulse: (!) 57 (!) 58  Resp: 14 15  Temp:    SpO2: 100% 100%   General: Pt is alert, awake, not in acute distress Cardiovascular: RRR, S1/S2 +, no rubs, no gallops Respiratory: CTA bilaterally, no wheezing, no rhonchi Abdominal: Soft, NT, ND, bowel sounds + Extremities: no edema, no cyanosis  Discharge Instructions  Discharge Instructions     Diet - low sodium heart healthy   Complete by: As directed    Discharge instructions   Complete by: As directed    Cbc bmp in  1 wk from pcp  Please call call MD or return to ER for similar or worsening recurring problem that brought you to hospital or if any fever,nausea/vomiting,abdominal pain, uncontrolled pain, chest pain,  shortness of breath or any other alarming symptoms.  Please follow-up your doctor as instructed in a week time and call the office for appointment.  Please avoid alcohol, smoking, or any other illicit substance and maintain healthy habits including taking your regular medications as prescribed.  You were cared for by a hospitalist during your hospital stay. If you have any questions  about your discharge medications or the care you received while you were in the hospital after you are discharged, you can call the unit and ask to speak with the hospitalist on call if the hospitalist that took care of you is not available.  Once you are discharged, your primary care physician will handle any further medical issues. Please note that NO REFILLS for any discharge medications will be authorized once you are discharged, as it is imperative that you return to  your primary care physician (or establish a relationship with a primary care physician if you do not have one) for your aftercare needs so that they can reassess your need for medications and monitor your lab values   Increase activity slowly   Complete by: As directed       Allergies as of 11/07/2022       Reactions   Iodinated Contrast Media Hives, Itching   Lisinopril Itching, Cough   Other Hives, Itching    Seafood crustaceans  "itchy throat"        Medication List     TAKE these medications    acetaminophen 325 MG tablet Commonly known as: TYLENOL Take 325 mg by mouth daily as needed for moderate pain or headache.   amLODipine 10 MG tablet Commonly known as: NORVASC Take 10 mg by mouth daily.   aspirin 81 MG chewable tablet Chew 1 tablet by mouth daily.   azaTHIOprine 50 MG tablet Commonly known as: IMURAN Take 25 mg by mouth daily.   BIOTIN PO Take 500 mcg by mouth daily.   calcium carbonate 1500 (600 Ca) MG Tabs tablet Commonly known as: OSCAL Take 1,500 mg by mouth daily with breakfast.   docusate sodium 100 MG capsule Commonly known as: COLACE Take 100 mg by mouth as needed for mild constipation.   ferrous sulfate 325 (65 FE) MG tablet Take 1 tablet (325 mg total) by mouth daily.   fluticasone 50 MCG/ACT nasal spray Commonly known as: FLONASE Place 1 spray into both nostrils daily as needed for allergies or rhinitis.   folic acid 1 MG tablet Commonly known as: FOLVITE Take 1 tablet (1 mg total) by mouth daily.   Magnesium Oxide 400 MG Caps Take 1 capsule (400 mg total) by mouth 2 (two) times daily for 5 days.   NEURIVA PO Take 1 capsule by mouth daily.   OXYGEN Inhale 1.5 L into the lungs at bedtime.   polyethylene glycol 17 g packet Commonly known as: MIRALAX / GLYCOLAX Take 17 g by mouth every other day.   rosuvastatin 20 MG tablet Commonly known as: CRESTOR Take 20 mg by mouth in the morning.   Spiriva Respimat 1.25 MCG/ACT  Aers Generic drug: Tiotropium Bromide Monohydrate Inhale 2 puffs into the lungs daily.   valACYclovir 1000 MG tablet Commonly known as: VALTREX Take 1,000 mg by mouth daily.   VITAMIN B12 PO Take 1 tablet by mouth daily.   VITAMIN C PO Take 1 tablet by mouth daily.   Zinc 50 MG Tabs Take 50 mg by mouth daily.        Allergies  Allergen Reactions   Iodinated Contrast Media Hives and Itching   Lisinopril Itching and Cough   Other Hives and Itching     Seafood crustaceans  "itchy throat"    The results of significant diagnostics from this hospitalization (including imaging, microbiology, ancillary and laboratory) are listed below for reference.    Microbiology: No results found for this or any previous  visit (from the past 240 hour(s)).  Procedures/Studies: No results found.  Labs: BNP (last 3 results) No results for input(s): "BNP" in the last 8760 hours. Basic Metabolic Panel: Recent Labs  Lab 11/06/22 0930 11/07/22 0010 11/07/22 0558  NA 138 141 138  K 4.2 3.5 3.7  CL 103 110 110  CO2 21 22 20*  GLUCOSE 101* 112* 96  BUN 16 22 19   CREATININE 1.25* 1.13* 0.90  CALCIUM 9.9 9.2 9.1  MG  --   --  1.5*   Liver Function Tests: Recent Labs  Lab 11/06/22 0930 11/07/22 0010 11/07/22 0558  AST 20 20 19   ALT 12 11 11   ALKPHOS 43* 33* 30*  BILITOT 0.5 0.5 1.3*  PROT 8.3 7.3 6.9  ALBUMIN 4.6 3.3* 3.2*   Recent Labs  Lab 11/07/22 0010  LIPASE 64*   No results for input(s): "AMMONIA" in the last 168 hours. CBC: Recent Labs  Lab 11/06/22 0930 11/07/22 0010 11/07/22 0558 11/07/22 1247  WBC 2.6* 3.0* 3.1*  --   NEUTROABS  --  1.5* 1.7  --   HGB 6.4* 5.6* 7.0* 9.2*  HCT 22.4* 19.0* 23.2* 30.0*  MCV 79.2* 78.5* 80.3  --   PLT 194 170 156  --   Lipid Profile Recent Labs    11/06/22 0930  CHOL 114  HDL 52  LDLCALC 48  TRIG 64  CHOLHDL 2.2   Recent Labs    11/07/22 0558  RETICCTPCT 0.9   Urinalysis    Component Value Date/Time    COLORURINE YELLOW 10/08/2017 1908   APPEARANCEUR CLEAR 10/08/2017 1908   LABSPEC 1.010 10/08/2017 1908   PHURINE 6.0 10/08/2017 1908   GLUCOSEU NEGATIVE 10/08/2017 1908   HGBUR SMALL (A) 10/08/2017 1908   BILIRUBINUR NEGATIVE 10/08/2017 1908   KETONESUR NEGATIVE 10/08/2017 1908   PROTEINUR NEGATIVE 10/08/2017 1908   UROBILINOGEN 0.2 10/25/2014 1514   NITRITE NEGATIVE 10/08/2017 1908   LEUKOCYTESUR NEGATIVE 10/08/2017 1908   Sepsis Labs Recent Labs  Lab 11/06/22 0930 11/07/22 0010 11/07/22 0558  WBC 2.6* 3.0* 3.1*   Microbiology No results found for this or any previous visit (from the past 240 hour(s)).  Time coordinating discharge: 25 minutes  SIGNED: 11/08/22, MD  [facility] 11/07/2022, 2:20 PM  If 7PM-7AM, please contact night-coverage [email or web address]

## 2022-11-07 NOTE — ED Notes (Signed)
Pt re educated on s/s of rxn. Pt stated understanding. Pt denies any s/s of rxn. Pt's vitals stable. Pt in no acute distress. Pt AAOx4. Pt on RA. Pt's call bell within reach. Pt stated she will notify RN if pt develops any s/s of rxn.

## 2022-11-07 NOTE — ED Notes (Signed)
Pt educated on s/s of transfusion rxn. Pt stated understanding. Pt stated she would notify RN immediately if pt develops any s/s of rxn. RN at bedside. Vitals stable. Pt in no acute distress.

## 2022-11-07 NOTE — ED Provider Notes (Signed)
Patient signed out pending work.  Of note, was sent in by Dr. Ninetta Lights for hemoglobin of 6.4.  She has been feeling some what increased fatigue and dizziness.  She has also noted some intermittent bleeding which she has attributed to hemorrhoids.  Per prior provider, Hemoccult was negative and no significant bleeding on exam.  She did have some hemorrhoids.  Repeat hemoglobin here is 5.6.  She was typed and screened.  I have ordered 2 units of blood.  Given the significance of her anemia, we will plan for admission and observation.  Physical Exam  BP (!) 106/55   Pulse 62   Temp 98.5 F (36.9 C) (Oral)   Resp 16   SpO2 100%   Physical Exam Awake, alert, no acute distress. Procedures  .Critical Care  Performed by: Shon Baton, MD Authorized by: Shon Baton, MD   Critical care provider statement:    Critical care time (minutes):  30   Critical care was necessary to treat or prevent imminent or life-threatening deterioration of the following conditions: symptomatic anemia.   Critical care was time spent personally by me on the following activities:  Development of treatment plan with patient or surrogate, discussions with consultants, evaluation of patient's response to treatment, examination of patient, ordering and review of laboratory studies, ordering and review of radiographic studies, ordering and performing treatments and interventions, pulse oximetry, re-evaluation of patient's condition and review of old charts   ED Course / MDM    Medical Decision Making Amount and/or Complexity of Data Reviewed Labs: ordered.  Risk Prescription drug management. Decision regarding hospitalization.   Problem List Items Addressed This Visit   None Visit Diagnoses     Symptomatic anemia    -  Primary             Shaneen Reeser, Mayer Masker, MD 11/07/22 [phone]

## 2022-11-08 LAB — TYPE AND SCREEN
ABO/RH(D): A POS
Antibody Screen: NEGATIVE
Unit division: 0
Unit division: 0

## 2022-11-08 LAB — HIV-1 RNA QUANT-NO REFLEX-BLD: HIV-1 RNA Viral Load: <20 copies/mL

## 2022-11-08 LAB — BPAM RBC
Blood Product Expiration Date: 202312122359
Blood Product Expiration Date: 202312122359
ISSUE DATE / TIME: 202311290244
ISSUE DATE / TIME: 202311290609
Unit Type and Rh: 6200
Unit Type and Rh: 6200

## 2022-11-20 ENCOUNTER — Other Ambulatory Visit: Payer: Self-pay

## 2022-11-20 ENCOUNTER — Encounter: Payer: Self-pay | Admitting: Infectious Diseases

## 2022-11-20 ENCOUNTER — Ambulatory Visit (INDEPENDENT_AMBULATORY_CARE_PROVIDER_SITE_OTHER): Payer: Medicare Other | Admitting: Infectious Diseases

## 2022-11-20 VITALS — BP 140/63 | HR 59 | Temp 97.8°F | Ht 60.0 in | Wt 123.0 lb

## 2022-11-20 DIAGNOSIS — D649 Anemia, unspecified: Secondary | ICD-10-CM

## 2022-11-20 DIAGNOSIS — B20 Human immunodeficiency virus [HIV] disease: Secondary | ICD-10-CM | POA: Diagnosis not present

## 2022-11-20 DIAGNOSIS — Z79899 Other long term (current) drug therapy: Secondary | ICD-10-CM

## 2022-11-20 DIAGNOSIS — Z21 Asymptomatic human immunodeficiency virus [HIV] infection status: Secondary | ICD-10-CM

## 2022-11-20 DIAGNOSIS — Z113 Encounter for screening for infections with a predominantly sexual mode of transmission: Secondary | ICD-10-CM | POA: Diagnosis not present

## 2022-11-20 LAB — CBC
HCT: 35.7 % — ABNORMAL LOW (ref 36.0–46.0)
Hemoglobin: 10.8 g/dL — ABNORMAL LOW (ref 12.0–15.0)
MCH: 25.2 pg — ABNORMAL LOW (ref 26.0–34.0)
MCHC: 30.3 g/dL (ref 30.0–36.0)
MCV: 83.2 fL (ref 80.0–100.0)
Platelets: 135 10*3/uL — ABNORMAL LOW (ref 150–400)
RBC: 4.29 MIL/uL (ref 3.87–5.11)
RDW: 22.8 % — ABNORMAL HIGH (ref 11.5–15.5)
WBC: 2.8 10*3/uL — ABNORMAL LOW (ref 4.0–10.5)
nRBC: 0 % (ref 0.0–0.2)

## 2022-11-20 NOTE — Progress Notes (Signed)
Subjective:    Patient ID: Tina Olson, female  DOB: 25-Mar-1946, 76 y.o.        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 Was started on harvoni in July 2015 for her Hep C (VL 6.5 million) and F0 elastography/ultrasound. She was not able to tolerate this (thinks she took ~ 1/2 of the rx, didn't get last bottle). Her repeat Hep C RNA has been negative (10-04-14) & (06-27-15)   She is on regular regimen (QOD) that improved her BM. She has had 2 prev colonoscopies. She has appt with GI 06-14-21.  She has seen optho for pseudophakia, cataracts, focal chorioretinal inflammation. She feels like her vision is good, she takes rx (imuran, valtrex) for this.     States she has bleeding hemmeroids.  At her f/u blood draw for this visit she was found to have Hgb < 5.6. She was adm for 24 h and had 2u PRBC. Hgb 9.2 at d/c.  Had some weakness, since resolved.  No BRBPR. Occas had bleeding from her hemmerrhoids. Occas usually suppository and resolves quickly.   No ART. She is now on 1.5 L home O2 at night.    Prev breast bx 2009- calcifications.    last COVID 04-2021. HIV 1 RNA Quant  Date Value  02/15/2022 31 Copies/mL (H)  05/18/2021 118 copies/mL (H)  11/24/2020 93 Copies/mL (H)   HIV-1 RNA Viral Load (copies/mL)  Date Value  11/06/2022 <20   CD4 T Cell Abs (/uL)  Date Value  11/06/2022 313 (L)  11/24/2020 320 (L)  06/15/2020 317 (L)     Health Maintenance  Topic Date Due   Medicare Annual Wellness (AWV)  Never done   DTaP/Tdap/Td (1 - Tdap) Never done   Zoster Vaccines- Shingrix (1 of 2) Never done   DEXA SCAN  Never done   COVID-19 Vaccine (4 - 2023-24 season) 01/09/2023   Lung Cancer Screening  09/27/2023   MAMMOGRAM  06/14/2024   COLONOSCOPY (Pts 45-59yrs Insurance coverage will need to be confirmed)  06/12/2027   Pneumonia Vaccine 49+ Years old  Completed   INFLUENZA VACCINE  Completed    Hepatitis C Screening  Completed   HPV VACCINES  Aged Out      Review of Systems  Constitutional:  Negative for chills and fever.  Respiratory:  Negative for shortness of breath.        DOE, improved with spiriva inhaler.   Cardiovascular:  Negative for chest pain.  Gastrointestinal:  Negative for blood in stool.  Neurological:  Positive for headaches (sinus, occas blood on blowing nose.). Negative for dizziness.    Please see HPI. All other systems reviewed and negative.     Objective:  Physical Exam Vitals reviewed.  Constitutional:      Appearance: She is normal weight.  HENT:     Mouth/Throat:     Mouth: Mucous membranes are moist.     Pharynx: No oropharyngeal exudate.  Eyes:     Conjunctiva/sclera: Conjunctivae normal.  Cardiovascular:     Rate and Rhythm: Normal rate and regular rhythm.  Pulmonary:     Effort: Pulmonary effort is normal.     Breath sounds: Normal breath sounds.  Abdominal:     General: Bowel sounds are normal. There is no distension.     Palpations: Abdomen is soft.     Tenderness: There is no abdominal tenderness.  Musculoskeletal:     Right lower leg: No edema.     Left lower leg: No edema.  Neurological:     General: No focal deficit present.     Mental Status: She is alert.            Assessment & Plan:

## 2022-11-20 NOTE — Assessment & Plan Note (Addendum)
She is doing well Continues folic acid and iron.  Will recheck her hgb today.  Will plan on seeing her back in 2 months if h/h normal.

## 2022-11-20 NOTE — Assessment & Plan Note (Addendum)
She is undetectable on her most recent labs, off medicaitons.  Will repeat at her f/u visit in 2 months.  She has gotten flu and covid vax Rec she go to pharm and get RSV vax.

## 2022-11-20 NOTE — Addendum Note (Signed)
Addended by: Bufford Spikes on: 11/20/2022 03:55 PM   Modules accepted: Orders

## 2022-12-17 ENCOUNTER — Ambulatory Visit: Payer: Medicare Other | Admitting: Internal Medicine

## 2022-12-31 NOTE — Progress Notes (Signed)
Office Visit Note  Patient: Tina Olson             Date of Birth: 07/11/1946           MRN: [id number]             PCP: Loura Pardon, MD Referring: Clayton Bibles, NP Visit Date: 01/01/2023   Subjective:  New Patient (Initial Visit) (Patient does not know why she's here.)   History of Present Illness: Deshara Rossi is a 77 y.o. female here for evaluation of positive ANA, positive RF, positive Scl-70, and ESR elevation checked in association with lung imaging findings concerning for ILD and with skin changes. She is a long term smoker with emphysema who had some concerning findings CT imaging going back to at least 11/2019 and on 06/2020 imaging. However more recent study checked in November last year was negative for ILD just demonstrating known emphysematous changes. She had some insidious progression of dyspnea on exertion in the past year getting short of breath more easily with walking and daily activity. No particular cough or chest pain.  She currently complains of some skin rash with rough texture, thickening, with minimal pain or itching changes on the lateral upper arm and on her palms. She denies erythematous rashes or significant photosensitivity. She denies raynaud's symptoms. She does not complain about much joint pain and denies visible joint swelling. She has experienced some increase in nasal and throat dryness with starting overnight supplemental oxygen by nasal canula. This sometimes causes hoarseness of voice or scant blood from her nose in the morning. But no oral or nasal ulcers no pain and no swallowing difficulty. She does have acid reflux symptoms sometimes.  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 Manuella Ghazi. She has a history significant for HIV without complications (elite controller) and follows with Dr. Johnnye Sima. She also has previous chronic hepatitis C infection she completed only part of the  medication course but with subsequent negative viral serology and also remote treatment for syphilis.  Labs reviewed ANA 1:80 speckled Scl-70 1.5 dsDNA, SSA, SSB neg RF 80 CCP neg ESR 130 TP 9.1   Activities of Daily Living:  Patient reports morning stiffness for 0  none .   Patient Denies nocturnal pain.  Difficulty dressing/grooming: Denies Difficulty climbing stairs: Denies Difficulty getting out of chair: Denies Difficulty using hands for taps, buttons, cutlery, and/or writing: Denies  Review of Systems  Constitutional:  Negative for fatigue.  HENT:  Negative for mouth sores and mouth dryness.   Eyes:  Negative for dryness.  Respiratory:  Negative for shortness of breath.   Cardiovascular:  Negative for chest pain and palpitations.  Gastrointestinal:  Negative for blood in stool, constipation and diarrhea.  Endocrine: Negative for increased urination.  Genitourinary:  Negative for involuntary urination.  Musculoskeletal:  Negative for joint pain, gait problem, joint pain, joint swelling, myalgias, muscle weakness, morning stiffness, muscle tenderness and myalgias.  Skin:  Positive for rash. Negative for color change, hair loss and sensitivity to sunlight.  Allergic/Immunologic: Negative for susceptible to infections.  Neurological:  Negative for dizziness and headaches.  Hematological:  Negative for swollen glands.  Psychiatric/Behavioral:  Negative for depressed mood and sleep disturbance. The patient is not nervous/anxious.     PMFS History:  Patient Active Problem List   Diagnosis Date Noted   History of hepatitis C 01/01/2023   Positive ANA (antinuclear antibody) 01/01/2023   Acute on chronic anemia 11/07/2022  HLD (hyperlipidemia) 11/07/2022   ILD (interstitial lung disease) ([place]) 09/06/2022   Shortness of breath 09/06/2022   Centrilobular emphysema ([place]) 09/06/2022   Normocytic anemia 06/14/2021   Screening for cervical cancer 11/24/2020   Atherosclerosis  of aorta ([place]) 06/30/2020   COPD (chronic obstructive pulmonary disease) ([place]) 06/30/2020   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 12/22/2019   Pseudophakia of both eyes 12/22/2019   Chest pain 01/26/2017   Microcytic anemia 01/26/2017   Acute kidney injury ([place]) 01/26/2017   Chronic leukopenia 01/26/2017   Constipation 01/26/2017   Essential hypertension 10/08/2011   SHELLFISH ALLERGY 10/20/2009   HIV (human immunodeficiency virus infection) ([place]) 04/18/2009   BREAST MASS, BENIGN 04/18/2009   Melena 04/18/2009    Past Medical History:  Diagnosis Date   Acid reflux    Anemia 2017   Difficult intravenous access    Hepatitis C 07/2014   took harvani tx    History of blood transfusion 2017   2 units   HIV infection ([place]) since 1993   Hypertension    Internal hemorrhoid    Right inguinal hernia     Family History  Problem Relation Age of Onset   Alzheimer's disease Mother    Glaucoma Mother    Hypertension Mother    Cancer Father        ? type   Seizures Son    Past Surgical History:  Procedure Laterality Date   BREAST BIOPSY Right 07/26/2008   CATARACT EXTRACTION Right 2016   CATARACT EXTRACTION Left 06/2019   COLONOSCOPY WITH PROPOFOL N/A 06/11/2017   Procedure: COLONOSCOPY WITH PROPOFOL;  Surgeon: Mauri Pole, MD;  Location: [facility];  Service: Endoscopy;  Laterality: N/A;  PT IS KNOWN HEART STICK. WILL PROBABLY REQUIRE ULTRASOUND OR ANESTHESIA FOR IV STICK.   ESOPHAGOGASTRODUODENOSCOPY (EGD) WITH PROPOFOL N/A 06/11/2017   Procedure: ESOPHAGOGASTRODUODENOSCOPY (EGD) WITH PROPOFOL;  Surgeon: Mauri Pole, MD;  Location: [facility];  Service: Endoscopy;  Laterality: N/A;   Social History   Social History Narrative   Not on file   Immunization History  Administered Date(s) Administered   COVID-19, mRNA, vaccine(Comirnaty)12 years and older 11/14/2022   Fluad Quad(high Dose 65+) 01/07/2020, 02/15/2022, 09/19/2022   Hepatitis A  09/22/2010, 05/02/2011   Hepatitis B 05/10/2008, 06/09/2008, 10/11/2008   Hepatitis B, adult 10/14/2013, 11/13/2013, 05/17/2014   Influenza Split 10/08/2011, 09/18/2012   Influenza Whole 10/11/2008, 10/20/2009, 09/22/2010, 10/25/2014   Influenza,inj,Quad PF,6+ Mos 08/03/2013, 11/04/2017, 09/24/2018   Influenza-Unspecified 10/24/2015, 10/17/2016, 11/24/2020   Moderna SARS-COV2 Booster Vaccination 09/26/2022   PFIZER(Purple Top)SARS-COV-2 Vaccination 02/05/2020, 03/02/2020, 11/01/2020   Pneumococcal Conjugate-13 09/24/2018   Pneumococcal Polysaccharide-23 09/09/2008, 04/01/2013, 01/07/2020     Objective: Vital Signs: BP 128/67 (BP Location: Right Arm, Patient Position: Sitting, Cuff Size: Normal)   Pulse (!) 55   Resp 16   Ht 5' (1.524 m)   Wt 124 lb (56.2 kg)   BMI 24.22 kg/m    Physical Exam HENT:     Right Ear: External ear normal.     Left Ear: External ear normal.     Mouth/Throat:     Mouth: Mucous membranes are moist.     Pharynx: Oropharynx is clear.     Comments: Dentures Eyes:     Conjunctiva/sclera: Conjunctivae normal.  Cardiovascular:     Rate and Rhythm: Normal rate and regular rhythm.  Pulmonary:     Effort: Pulmonary effort is normal.     Breath sounds: Normal breath sounds.  Musculoskeletal:  Right lower leg: No edema.     Left lower leg: No edema.  Lymphadenopathy:     Cervical: No cervical adenopathy.  Skin:    General: Skin is warm and dry.     Findings: Rash present.     Comments: Rough skin texture on upper arm and lateral side of shoulder extending shortly onto upper back, no visible discoloration Skin thickening on palmar surfaces without focal contractures or nodules, no discoloration or telangiectasias Healed areas on arms and legs that appear to be remote, fully healed well-demarcated ulcers or lesions Normal appearing nailfold capillaroscopy  Neurological:     Mental Status: She is alert.  Psychiatric:        Mood and Affect: Mood  normal.      Musculoskeletal Exam:  Neck full ROM no tenderness Shoulders full ROM no tenderness or swelling Elbows full ROM no tenderness or swelling Wrists full ROM no tenderness or swelling Fingers full ROM, slight squaring of first CMC joint with no tenderness or swelling No paraspinal tenderness to palpation over upper and lower back Hip normal internal and external rotation without pain, no tenderness to lateral hip palpation Knees full ROM no tenderness or swelling right knee crepitus present Ankles full ROM no tenderness or swelling First MTP bunion on both feet with slight lateral deviation, corns on lateral side of fifth toe with   Investigation: No additional findings.  Imaging: No results found.  Recent Labs: Lab Results  Component Value Date   WBC 2.8 (L) 11/20/2022   HGB 10.8 (L) 11/20/2022   PLT 135 (L) 11/20/2022   NA 138 11/07/2022   K 3.7 11/07/2022   CL 110 11/07/2022   CO2 20 (L) 11/07/2022   GLUCOSE 96 11/07/2022   BUN 19 11/07/2022   CREATININE 0.90 11/07/2022   BILITOT 1.3 (H) 11/07/2022   ALKPHOS 30 (L) 11/07/2022   AST 19 11/07/2022   ALT 11 11/07/2022   PROT 6.9 11/07/2022   ALBUMIN 3.2 (L) 11/07/2022   CALCIUM 9.1 11/07/2022   GFRAA >60 04/04/2020   QFTBGOLDPLUS NEGATIVE 08/16/2022    Speciality Comments: No specialty comments available.  Procedures:  No procedures performed Allergies: Iodinated contrast media, Lisinopril, and Other   Assessment / Plan:     Visit Diagnoses: Positive ANA (antinuclear antibody)  Positive ANA antibodies including low positive SCL 70 as well as moderately high rheumatoid factor and very elevated sedimentation rate but I do not see any specific signs for systemic disease activity on exam or from history today.  Very minor skin changes on the upper extremities but not consistent with sclerodactyly.  No reported Raynaud's symptoms and no evidence of telangiectasias or digital ischemic changes to suggest  scleroderma.  No peripheral joint synovitis and minimal joint complaint overall so does not suggest any inflammatory arthritis process at this time.  She does have known peripheral focal chorioretinitis which can be associated with systemic inflammation in some cases but is pretty nonspecific for antibody marker associations.  Some may be false positive related to her infectious disease history with HIV and previous resolved infections including hepatitis C.  She is already on long-term treatment with azathioprine no findings indicating additional DMARD treatment or further labs recommended at this time.  ILD (interstitial lung disease) ([facility])  Imaging findings concerning for ILD associated with respiratory symptoms but fortunately her more recent imaging does not appear consistent with ILD or pulmonary fibrosis changes.   Orders: No orders of the defined types were placed in this  encounter.  No orders of the defined types were placed in this encounter.    Follow-Up Instructions: Return if symptoms worsen or fail to improve.   Fuller Plan, MD  Note - This record has been created using [employer].  Chart creation errors have been sought, but may not always  have been located. Such creation errors do not reflect on  the standard of medical care.

## 2023-01-01 ENCOUNTER — Ambulatory Visit: Payer: 59 | Attending: Internal Medicine | Admitting: Internal Medicine

## 2023-01-01 ENCOUNTER — Encounter: Payer: Self-pay | Admitting: Internal Medicine

## 2023-01-01 VITALS — BP 128/67 | HR 55 | Resp 16 | Ht 60.0 in | Wt 124.0 lb

## 2023-01-01 DIAGNOSIS — J849 Interstitial pulmonary disease, unspecified: Secondary | ICD-10-CM | POA: Diagnosis not present

## 2023-01-01 DIAGNOSIS — R768 Other specified abnormal immunological findings in serum: Secondary | ICD-10-CM | POA: Diagnosis not present

## 2023-01-01 DIAGNOSIS — Z8619 Personal history of other infectious and parasitic diseases: Secondary | ICD-10-CM | POA: Insufficient documentation

## 2023-01-24 ENCOUNTER — Telehealth: Payer: Self-pay | Admitting: Internal Medicine

## 2023-01-24 MED ORDER — SPIRIVA RESPIMAT 1.25 MCG/ACT IN AERS: 2.0000 | INHALATION_SPRAY | Freq: Every day | RESPIRATORY_TRACT | 5 refills | Status: DC

## 2023-01-24 NOTE — Telephone Encounter (Signed)
Spoke with the pt and advised I have sent refill on her spiriva respimat  Nothing further needed

## 2023-01-24 NOTE — Telephone Encounter (Signed)
ATC patient. LVMTCB. 

## 2023-01-25 ENCOUNTER — Telehealth: Payer: Self-pay | Admitting: Internal Medicine

## 2023-01-25 NOTE — Telephone Encounter (Signed)
ATC patient. LVMTCB. 

## 2023-01-29 MED ORDER — SPIRIVA RESPIMAT 1.25 MCG/ACT IN AERS: 2.0000 | INHALATION_SPRAY | Freq: Every day | RESPIRATORY_TRACT | 11 refills | Status: DC

## 2023-01-29 NOTE — Telephone Encounter (Signed)
Called and spoke with the pt  Rx for Spiriva 1.25 sent to preferred pharm  Nothing further needed

## 2023-01-29 NOTE — Telephone Encounter (Signed)
Called the pt and there was no answer- LMTCB.  

## 2023-01-29 NOTE — Telephone Encounter (Signed)
PT came in. Please try calling again. Adv we sent this in 2/15.

## 2023-01-29 NOTE — Telephone Encounter (Signed)
Pt. Needs [place] sent to pharmacy to [facility] on [address] she had only got sample from our office no prescription has be called in yet

## 2023-02-12 ENCOUNTER — Other Ambulatory Visit (HOSPITAL_COMMUNITY)
Admission: RE | Admit: 2023-02-12 | Discharge: 2023-02-12 | Disposition: A | Discharge status: Home / Self Care | Payer: 59 | Source: Ambulatory Visit | Attending: Infectious Diseases | Admitting: Infectious Diseases

## 2023-02-12 ENCOUNTER — Other Ambulatory Visit (INDEPENDENT_AMBULATORY_CARE_PROVIDER_SITE_OTHER): Payer: 59

## 2023-02-12 DIAGNOSIS — Z113 Encounter for screening for infections with a predominantly sexual mode of transmission: Secondary | ICD-10-CM | POA: Diagnosis present

## 2023-02-12 DIAGNOSIS — Z21 Asymptomatic human immunodeficiency virus [HIV] infection status: Secondary | ICD-10-CM

## 2023-02-12 DIAGNOSIS — Z79899 Other long term (current) drug therapy: Secondary | ICD-10-CM

## 2023-02-13 LAB — URINE CYTOLOGY ANCILLARY ONLY
Chlamydia: NEGATIVE
Comment: NEGATIVE
Comment: NORMAL
Neisseria Gonorrhea: NEGATIVE

## 2023-02-13 LAB — T-HELPER CELL (CD4) - (RCID CLINIC ONLY)
CD4 % Helper T Cell: 31 % — ABNORMAL LOW (ref 33–65)
CD4 T Cell Abs: 350 /uL — ABNORMAL LOW (ref 400–1790)

## 2023-02-13 LAB — LIPID PANEL

## 2023-02-14 LAB — CMP14 + ANION GAP
ALT: 17 IU/L (ref 0–32)
AST: 28 IU/L (ref 0–40)
Albumin/Globulin Ratio: 1 — ABNORMAL LOW (ref 1.2–2.2)
Albumin: 4.3 g/dL (ref 3.8–4.8)
Alkaline Phosphatase: 50 IU/L (ref 44–121)
Anion Gap: 19 mmol/L — ABNORMAL HIGH (ref 10.0–18.0)
BUN/Creatinine Ratio: 20 (ref 12–28)
BUN: 18 mg/dL (ref 8–27)
Bilirubin Total: 0.3 mg/dL (ref 0.0–1.2)
CO2: 15 mmol/L — ABNORMAL LOW (ref 20–29)
Calcium: 10 mg/dL (ref 8.7–10.3)
Chloride: 104 mmol/L (ref 96–106)
Creatinine, Ser: 0.9 mg/dL (ref 0.57–1.00)
Globulin, Total: 4.1 g/dL (ref 1.5–4.5)
Glucose: 111 mg/dL — ABNORMAL HIGH (ref 70–99)
Potassium: 4.2 mmol/L (ref 3.5–5.2)
Sodium: 138 mmol/L (ref 134–144)
Total Protein: 8.4 g/dL (ref 6.0–8.5)
eGFR: 67 mL/min/{1.73_m2} (ref >59)

## 2023-02-14 LAB — CBC
Hematocrit: 33.5 % — ABNORMAL LOW (ref 34.0–46.6)
Hemoglobin: 11.3 g/dL (ref 11.1–15.9)
MCH: 30.9 pg (ref 26.6–33.0)
MCHC: 33.7 g/dL (ref 31.5–35.7)
MCV: 92 fL (ref 79–97)
Platelets: 136 10*3/uL — ABNORMAL LOW (ref 150–450)
RBC: 3.66 x10E6/uL — ABNORMAL LOW (ref 3.77–5.28)
RDW: 15 % (ref 11.7–15.4)
WBC: 2.7 10*3/uL — ABNORMAL LOW (ref 3.4–10.8)

## 2023-02-14 LAB — LIPID PANEL
Chol/HDL Ratio: 2.3 ratio (ref 0.0–4.4)
Cholesterol, Total: 143 mg/dL (ref 100–199)
HDL: 62 mg/dL (ref >39)
LDL Chol Calc (NIH): 68 mg/dL (ref 0–99)
Triglycerides: 62 mg/dL (ref 0–149)
VLDL Cholesterol Cal: 13 mg/dL (ref 5–40)

## 2023-02-14 LAB — HIV-1 RNA QUANT-NO REFLEX-BLD
HIV-1 RNA Viral Load Log: 2.114 log10copy/mL
HIV-1 RNA Viral Load: 130 copies/mL

## 2023-02-14 LAB — RPR: RPR Ser Ql: NONREACTIVE

## 2023-02-20 ENCOUNTER — Other Ambulatory Visit: Payer: Self-pay | Admitting: Family Medicine

## 2023-02-20 DIAGNOSIS — R1011 Right upper quadrant pain: Secondary | ICD-10-CM

## 2023-02-26 ENCOUNTER — Ambulatory Visit (INDEPENDENT_AMBULATORY_CARE_PROVIDER_SITE_OTHER): Payer: 59 | Admitting: Infectious Diseases

## 2023-02-26 ENCOUNTER — Ambulatory Visit (INDEPENDENT_AMBULATORY_CARE_PROVIDER_SITE_OTHER): Payer: 59

## 2023-02-26 ENCOUNTER — Encounter: Payer: Self-pay | Admitting: Infectious Diseases

## 2023-02-26 VITALS — BP 134/70 | HR 62 | Ht 60.0 in | Wt 126.2 lb

## 2023-02-26 DIAGNOSIS — Z113 Encounter for screening for infections with a predominantly sexual mode of transmission: Secondary | ICD-10-CM

## 2023-02-26 DIAGNOSIS — B20 Human immunodeficiency virus [HIV] disease: Secondary | ICD-10-CM

## 2023-02-26 DIAGNOSIS — J449 Chronic obstructive pulmonary disease, unspecified: Secondary | ICD-10-CM | POA: Diagnosis not present

## 2023-02-26 DIAGNOSIS — Z79899 Other long term (current) drug therapy: Secondary | ICD-10-CM

## 2023-02-26 DIAGNOSIS — K921 Melena: Secondary | ICD-10-CM | POA: Diagnosis not present

## 2023-02-26 DIAGNOSIS — I10 Essential (primary) hypertension: Secondary | ICD-10-CM | POA: Diagnosis not present

## 2023-02-26 DIAGNOSIS — E782 Mixed hyperlipidemia: Secondary | ICD-10-CM

## 2023-02-26 DIAGNOSIS — Z21 Asymptomatic human immunodeficiency virus [HIV] infection status: Secondary | ICD-10-CM

## 2023-02-26 DIAGNOSIS — N6313 Unspecified lump in the right breast, lower outer quadrant: Secondary | ICD-10-CM

## 2023-02-26 DIAGNOSIS — Z0000 Encounter for general adult medical examination without abnormal findings: Secondary | ICD-10-CM

## 2023-02-26 NOTE — Assessment & Plan Note (Signed)
She is doing well We discussed starting art, she defers.  My concern would be long term inflamatory or premature aging from untreated hiv. Alternatively, she is 89 and is doing well.  Her vax are up to date She needs PCV 20 at her f/u appt Will see her back in 6 months

## 2023-02-26 NOTE — Assessment & Plan Note (Signed)
Continues on statin LFTs stable.      Latest Ref Rng & Units 02/12/2023   10:40 AM 11/07/2022    5:58 AM 11/07/2022   12:10 AM  Hepatic Function  Total Protein 6.0 - 8.5 g/dL 8.4  6.9  7.3   Albumin 3.8 - 4.8 g/dL 4.3  3.2  3.3   AST 0 - 40 IU/L 28  19  20    ALT 0 - 32 IU/L 17  11  11    Alk Phosphatase 44 - 121 IU/L 50  30  33   Total Bilirubin 0.0 - 1.2 mg/dL 0.3  1.3  0.5

## 2023-02-26 NOTE — Assessment & Plan Note (Signed)
Repeat (-) She would like to stop mammos. We discussed that her life expectancy is probably > 5-10 years and I would continue.

## 2023-02-26 NOTE — Progress Notes (Signed)
Subjective:   Tina Olson is a 77 y.o. female who presents for an Initial Medicare Annual Wellness Visit. I connected with  Claudell Kyle on 02/26/23 by a  Face-To-Face Visit encounter and verified that I am speaking with the correct person using two identifiers.  Patient Location: Other:  Office/Clinic  Provider Location: Office/Clinic  I discussed the limitations of evaluation and management by telemedicine. The patient expressed understanding and agreed to proceed.  Review of Systems    Defer To PCP       Objective:    Today's Vitals   02/26/23 1633  BP: 134/70  Pulse: 62  SpO2: 100%  Weight: 126 lb 3.2 oz (57.2 kg)  Height: 5' (1.524 m)   Body mass index is 24.65 kg/m.     02/26/2023    4:35 PM 02/26/2023    3:19 PM 11/20/2022    2:35 PM 09/09/2020    9:17 AM 06/11/2017    7:04 AM 06/05/2017   12:50 PM 01/26/2017    4:00 PM  Advanced Directives  Does Patient Have a Medical Advance Directive? No No No No No No No  Would patient like information on creating a medical advance directive? No - Patient declined No - Patient declined No - Patient declined No - Patient declined No - Patient declined No - Patient declined Yes (Inpatient - patient requests chaplain consult to create a medical advance directive)    Current Medications (verified) Outpatient Encounter Medications as of 02/26/2023  Medication Sig   acetaminophen (TYLENOL) 325 MG tablet Take 325 mg by mouth daily as needed for moderate Olson or headache.   amLODipine (NORVASC) 10 MG tablet Take 10 mg by mouth daily.   Ascorbic Acid (VITAMIN C PO) Take 1 tablet by mouth daily.   aspirin 81 MG chewable tablet Chew 1 tablet by mouth daily.   azaTHIOprine (IMURAN) 50 MG tablet Take 25 mg by mouth daily.   BIOTIN PO Take 500 mcg by mouth daily.   calcium carbonate (OSCAL) 1500 (600 Ca) MG TABS tablet Take 1,500 mg by mouth daily with breakfast.   Cyanocobalamin (VITAMIN B12 PO) Take 1 tablet by mouth  daily.   docusate sodium (COLACE) 100 MG capsule Take 100 mg by mouth as needed for mild constipation.   ferrous sulfate 325 (65 FE) MG tablet Take 1 tablet (325 mg total) by mouth daily.   Misc Natural Products (NEURIVA PO) Take 1 capsule by mouth daily.   OXYGEN Inhale 1.5 L into the lungs at bedtime.   polyethylene glycol (MIRALAX / GLYCOLAX) packet Take 17 g by mouth every other day.   rosuvastatin (CRESTOR) 20 MG tablet Take 20 mg by mouth in the morning.   Tiotropium Bromide Monohydrate (SPIRIVA RESPIMAT) 1.25 MCG/ACT AERS Inhale 2 puffs into the lungs daily.   valACYclovir (VALTREX) 1000 MG tablet Take 1,000 mg by mouth daily.    Zinc 50 MG TABS Take 50 mg by mouth daily.   No facility-administered encounter medications on file as of 02/26/2023.    Allergies (verified) Iodinated contrast media, Lisinopril, and Other   History: Past Medical History:  Diagnosis Date   Acid reflux    Anemia 2017   Difficult intravenous access    Hepatitis C 07/2014   took harvani tx    History of blood transfusion 2017   2 units   HIV infection ([place]) since 1993   Hypertension    Internal hemorrhoid    Right inguinal hernia  Past Surgical History:  Procedure Laterality Date   BREAST BIOPSY Right 07/26/2008   CATARACT EXTRACTION Right 2016   CATARACT EXTRACTION Left 06/2019   COLONOSCOPY WITH PROPOFOL N/A 06/11/2017   Procedure: COLONOSCOPY WITH PROPOFOL;  Surgeon: Mauri Pole, MD;  Location: [facility];  Service: Endoscopy;  Laterality: N/A;  PT IS KNOWN HEART STICK. WILL PROBABLY REQUIRE ULTRASOUND OR ANESTHESIA FOR IV STICK.   ESOPHAGOGASTRODUODENOSCOPY (EGD) WITH PROPOFOL N/A 06/11/2017   Procedure: ESOPHAGOGASTRODUODENOSCOPY (EGD) WITH PROPOFOL;  Surgeon: Mauri Pole, MD;  Location: [facility];  Service: Endoscopy;  Laterality: N/A;   Family History  Problem Relation Age of Onset   Alzheimer's disease Mother    Glaucoma Mother    Hypertension Mother    Cancer  Father        ? type   Seizures Son    Social History   Socioeconomic History   Marital status: Widowed    Spouse name: Not on file   Number of children: 1   Years of education: Not on file   Highest education level: Not on file  Occupational History   Not on file  Tobacco Use   Smoking status: Former    Packs/day: 0.75    Years: 45.00    Additional pack years: 0.00    Total pack years: 33.75    Types: Cigarettes    Quit date: 2010    Years since quitting: 14.2    Passive exposure: Past   Smokeless tobacco: Never  Vaping Use   Vaping Use: Never used  Substance and Sexual Activity   Alcohol use: No    Alcohol/week: 0.0 standard drinks of alcohol   Drug use: No    Types: IV    Comment: clean since around 2000   Sexual activity: Not Currently    Comment: pt. declined condoms  Other Topics Concern   Not on file  Social History Narrative   Not on file   Social Determinants of Health   Financial Resource Strain: Low Risk  (02/26/2023)   Overall Financial Resource Strain (CARDIA)    Difficulty of Paying Living Expenses: Not hard at all  Food Insecurity: No Food Insecurity (02/26/2023)   Hunger Vital Sign    Worried About Running Out of Food in the Last Year: Never true    Ran Out of Food in the Last Year: Never true  Transportation Needs: No Transportation Needs (02/26/2023)   PRAPARE - Hydrologist (Medical): No    Lack of Transportation (Non-Medical): No  Physical Activity: Inactive (02/26/2023)   Exercise Vital Sign    Days of Exercise per Week: 0 days    Minutes of Exercise per Session: 0 min  Stress: No Stress Concern Present (02/26/2023)   [place]    Feeling of Stress : Not at all  Social Connections: Moderately Integrated (02/26/2023)   Social Connection and Isolation Panel [NHANES]    Frequency of Communication with Friends and Family: More than three times a week     Frequency of Social Gatherings with Friends and Family: More than three times a week    Attends Religious Services: More than 4 times per year    Active Member of [employer] or Organizations: Yes    Attends Archivist Meetings: More than 4 times per year    Marital Status: Widowed    Tobacco Counseling Counseling given: Not Answered   Clinical Intake:  Pre-visit preparation  completed: Yes  Olson : No/denies Olson     Nutritional Risks: None Diabetes: No  How often do you need to have someone help you when you read instructions, pamphlets, or other written materials from your doctor or pharmacy?: 1 - Never What is the last grade level you completed in school?: 12th grade  Diabetic?No  Interpreter Needed?: No  Information entered by :: Evagelia Knack,cma   Activities of Daily Living    02/26/2023    4:35 PM 02/26/2023    3:18 PM  In your present state of health, do you have any difficulty performing the following activities:  Hearing? 0 0  Vision? 0 0  Difficulty concentrating or making decisions? 0 0  Walking or climbing stairs? 0 1  Comment  copd/sob  Dressing or bathing? 0 0  Doing errands, shopping? 0 0    Patient Care Team: Loura Pardon, MD as PCP - General (Family Medicine) Campbell Riches, MD as PCP - Infectious Diseases (Infectious Diseases)  Indicate any recent Medical Services you may have received from other than [facility] providers in the past year (date may be approximate).     Assessment:   This is a routine wellness examination for Tina Olson.  Hearing/Vision screen No results found.  Dietary issues and exercise activities discussed:     Goals Addressed   None   Depression Screen    02/26/2023    4:35 PM 02/26/2023    3:18 PM 11/20/2022    2:35 PM 02/15/2022    2:35 PM 05/18/2021    3:36 PM 12/13/2020    1:56 PM 01/07/2020    2:35 PM  PHQ 2/9 Scores  PHQ - 2 Score 0 0 0 0 0 0 0    Fall Risk    02/26/2023    4:35 PM 02/26/2023     3:18 PM 11/20/2022    2:34 PM 02/15/2022    2:35 PM 05/18/2021    3:36 PM  [place] in the past year? 0 0 0 0 0  Number falls in past yr: 0 0 0 0   Injury with Fall? 0 0 0 0   Risk for fall due to : No Fall Risks No Fall Risks No Fall Risks    Follow up Falls evaluation completed;Falls prevention discussed Falls evaluation completed;Falls prevention discussed Falls evaluation completed;Falls prevention discussed  Falls evaluation completed    FALL RISK PREVENTION PERTAINING TO THE HOME:  Any stairs in or around the home? No  If so, are there any without handrails? No  Home free of loose throw rugs in walkways, pet beds, electrical cords, etc? Yes  Adequate lighting in your home to reduce risk of falls? Yes   ASSISTIVE DEVICES UTILIZED TO PREVENT FALLS:  Life alert? No  Use of a cane, walker or w/c? No  Grab bars in the bathroom? No  Shower chair or bench in shower? No  Elevated toilet seat or a handicapped toilet? No   TIMED UP AND GO:  Was the test performed? Yes .  Length of time to ambulate 10 feet: 0 sec.   Gait steady and fast without use of assistive device  Cognitive Function:        02/26/2023    4:35 PM  6CIT Screen  What Year? 0 points  What month? 0 points  What time? 0 points  Count back from 20 0 points  Months in reverse 0 points  Repeat phrase 0 points  Total Score 0  points    Immunizations Immunization History  Administered Date(s) Administered   COVID-19, mRNA, vaccine(Comirnaty)12 years and older 11/14/2022   Fluad Quad(high Dose 65+) 01/07/2020, 02/15/2022, 09/19/2022   Hepatitis A 09/22/2010, 05/02/2011   Hepatitis B 05/10/2008, 06/09/2008, 10/11/2008   Hepatitis B, ADULT 10/14/2013, 11/13/2013, 05/17/2014   Influenza Split 10/08/2011, 09/18/2012   Influenza Whole 10/11/2008, 10/20/2009, 09/22/2010, 10/25/2014   Influenza,inj,Quad PF,6+ Mos 08/03/2013, 11/04/2017, 09/24/2018   Influenza-Unspecified 10/24/2015, 10/17/2016, 11/24/2020    Moderna SARS-COV2 Booster Vaccination 09/26/2022   PFIZER(Purple Top)SARS-COV-2 Vaccination 02/05/2020, 03/02/2020, 11/01/2020   Pneumococcal Conjugate-13 09/24/2018   Pneumococcal Polysaccharide-23 09/09/2008, 04/01/2013, 01/07/2020    TDAP status: Due, Education has been provided regarding the importance of this vaccine. Advised may receive this vaccine at local pharmacy or Health Dept. Aware to provide a copy of the vaccination record if obtained from local pharmacy or Health Dept. Verbalized acceptance and understanding.  Flu Vaccine status: Up to date  Pneumococcal vaccine status: Up to date  Covid-19 vaccine status: Completed vaccines  Qualifies for Shingles Vaccine? No   Zostavax completed No   Shingrix Completed?: No.    Education has been provided regarding the importance of this vaccine. Patient has been advised to call insurance company to determine out of pocket expense if they have not yet received this vaccine. Advised may also receive vaccine at local pharmacy or Health Dept. Verbalized acceptance and understanding.  Screening Tests Health Maintenance  Topic Date Due   DTaP/Tdap/Td (1 - Tdap) Never done   Zoster Vaccines- Shingrix (1 of 2) Never done   COVID-19 Vaccine (4 - 2023-24 season) 01/09/2023   Lung Cancer Screening  09/27/2023   Medicare Annual Wellness (AWV)  02/26/2024   COLONOSCOPY (Pts 45-71yrs Insurance coverage will need to be confirmed)  06/12/2027   Pneumonia Vaccine 69+ Years old  Completed   INFLUENZA VACCINE  Completed   DEXA SCAN  Completed   Hepatitis C Screening  Completed   HPV VACCINES  Aged Out    Health Maintenance  Health Maintenance Due  Topic Date Due   DTaP/Tdap/Td (1 - Tdap) Never done   Zoster Vaccines- Shingrix (1 of 2) Never done   COVID-19 Vaccine (4 - 2023-24 season) 01/09/2023    Colorectal cancer screening: Type of screening: Colonoscopy. Completed 06/11/2017. Repeat every 10 years  Bone Density status: Completed  06/14/2022. Results reflect: Bone density results: OSTEOPOROSIS. Repeat every 0 years.  Lung Cancer Screening: (Low Dose CT Chest recommended if Age 30-80 years, 30 pack-year currently smoking OR have quit w/in 15years.) does not qualify.   Lung Cancer Screening Referral: N/A  Additional Screening:  Hepatitis C Screening: does not qualify; Completed 01/01/2023  Vision Screening: Recommended annual ophthalmology exams for early detection of glaucoma and other disorders of the eye. Is the patient up to date with their annual eye exam?  Yes  Who is the provider or what is the name of the office in which the patient attends annual eye exams? Dr.Sharp If pt is not established with a provider, would they like to be referred to a provider to establish care? No .   Dental Screening: Recommended annual dental exams for proper oral hygiene  Community Resource Referral / Chronic Care Management: CRR required this visit?  No   CCM required this visit?  No      Plan:     I have personally reviewed and noted the following in the patient's chart:   Medical and social history Use of alcohol, tobacco or illicit  drugs  Current medications and supplements including opioid prescriptions. Patient is not currently taking opioid prescriptions. Functional ability and status Nutritional status Physical activity Advanced directives List of other physicians Hospitalizations, surgeries, and ER visits in previous 12 months Vitals Screenings to include cognitive, depression, and falls Referrals and appointments  In addition, I have reviewed and discussed with patient certain preventive protocols, quality metrics, and best practice recommendations. A written personalized care plan for preventive services as well as general preventive health recommendations were provided to patient.     Tina Olson, [facility]   02/26/2023   Nurse Notes: Face-To-Face Visit  [name] , Thank you for taking time to come  for your Medicare Wellness Visit. I appreciate your ongoing commitment to your health goals. Please review the following plan we discussed and let me know if I can assist you in the future.   These are the goals we discussed:  Goals   None     This is a list of the screening recommended for you and due dates:  Health Maintenance  Topic Date Due   DTaP/Tdap/Td vaccine (1 - Tdap) Never done   Zoster (Shingles) Vaccine (1 of 2) Never done   COVID-19 Vaccine (4 - 2023-24 season) 01/09/2023   Screening for Lung Cancer  09/27/2023   Medicare Annual Wellness Visit  02/26/2024   Colon Cancer Screening  06/12/2027   Pneumonia Vaccine  Completed   Flu Shot  Completed   DEXA scan (bone density measurement)  Completed   Hepatitis C Screening: USPSTF Recommendation to screen - Ages 9-79 yo.  Completed   HPV Vaccine  Aged Out

## 2023-02-26 NOTE — Assessment & Plan Note (Signed)
H/h stable No recurrences by her hx.

## 2023-02-26 NOTE — Progress Notes (Signed)
Subjective:    Patient ID: Tina Olson, female  DOB: [birth year], 77 y.o.        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 Was started on harvoni in July 2015 for her Hep C (VL 6.5 million) and F0 elastography/ultrasound. She was not able to tolerate this (thinks she took ~ 1/2 of the rx, didn't get last bottle). Her repeat Hep C RNA were negative (10-04-14) & (06-27-15)   She has had 2 prev colonoscopies. She has appt with GI 06-14-21.  She has seen optho for pseudophakia, cataracts, focal chorioretinal inflammation. She feels like her vision is good, she takes rx (imuran, valtrex) for this.     States she has bleeding hemmeroids.  At her f/u blood draw for this visit (10-2022) she was found to have Hgb 5.6. She was adm for 24 h and had 2u PRBC. Hgb 9.2 at d/c.  Has had no further issues.   Has been eval at [facility] for her calcium score, no intervention.    No ART. She is now on 1.5 L home O2 at night for her ILD/COPD  (which she is followed by rheum, pulmonary for). .    Prev breast bx 2009- calcifications.  Mammo 06-2022; (-) Birads 1.    last COVID 04-2021. HIV 1 RNA Quant  Date Value  02/15/2022 31 Copies/mL (H)  05/18/2021 118 copies/mL (H)  11/24/2020 93 Copies/mL (H)   HIV-1 RNA Viral Load (copies/mL)  Date Value  02/12/2023 130  11/06/2022 <20   CD4 T Cell Abs (/uL)  Date Value  02/12/2023 350 (L)  11/06/2022 313 (L)  11/24/2020 320 (L)     Health Maintenance  Topic Date Due   Medicare Annual Wellness (AWV)  Never done   DTaP/Tdap/Td (1 - Tdap) Never done   Zoster Vaccines- Shingrix (1 of 2) Never done   COVID-19 Vaccine (4 - 2023-24 season) 01/09/2023   Lung Cancer Screening  09/27/2023   COLONOSCOPY (Pts 45-24yrs Insurance coverage will need to be confirmed)  06/12/2027   Pneumonia Vaccine 63+ Years old  Completed   INFLUENZA VACCINE  Completed   DEXA SCAN  Completed   Hepatitis C Screening  Completed   HPV  VACCINES  Aged Out    She had RSV/Flu/COVID vax this fall.   Review of Systems  Constitutional:  Negative for chills, fever and weight loss.  Respiratory:  Negative for cough and shortness of breath.   Gastrointestinal:  Positive for constipation. Negative for diarrhea.       Bloating.  Genitourinary:  Negative for dysuria.  Psychiatric/Behavioral:  Negative for depression. The patient does not have insomnia.     Please see HPI. All other systems reviewed and negative.     Objective:  Physical Exam Vitals reviewed.  Constitutional:      Appearance: Normal appearance.  HENT:     Mouth/Throat:     Mouth: Mucous membranes are moist.     Pharynx: No oropharyngeal exudate.  Eyes:     Extraocular Movements: Extraocular movements intact.     Pupils: Pupils are equal, round, and reactive to light.  Cardiovascular:     Rate and Rhythm: Normal rate and regular rhythm.  Pulmonary:     Effort: Pulmonary effort is normal.     Breath sounds: Normal breath sounds.  Abdominal:     General: Bowel sounds are normal. There is no distension.     Palpations: Abdomen is  soft.     Tenderness: There is no abdominal tenderness.  Musculoskeletal:     Cervical back: Normal range of motion and neck supple.     Right lower leg: No edema.     Left lower leg: No edema.  Neurological:     General: No focal deficit present.     Mental Status: She is alert.  Psychiatric:        Mood and Affect: Mood normal.            Assessment & Plan:

## 2023-02-26 NOTE — Assessment & Plan Note (Signed)
She appears to be well controlled on amlodipine.  Will watch.

## 2023-02-26 NOTE — Assessment & Plan Note (Signed)
She appears to be doing well [facility] and rheum f/u No change in her home O2.

## 2023-02-27 NOTE — Addendum Note (Signed)
Addended by: Graciana Sessa C on: 02/27/2023 01:55 PM   Modules accepted: Level of Service

## 2023-03-19 ENCOUNTER — Encounter: Payer: Self-pay | Admitting: Internal Medicine

## 2023-03-19 ENCOUNTER — Ambulatory Visit (INDEPENDENT_AMBULATORY_CARE_PROVIDER_SITE_OTHER): Payer: 59 | Admitting: Internal Medicine

## 2023-03-19 VITALS — BP 134/64 | HR 56 | Temp 98.7°F | Ht 60.0 in | Wt 129.0 lb

## 2023-03-19 DIAGNOSIS — R768 Other specified abnormal immunological findings in serum: Secondary | ICD-10-CM

## 2023-03-19 DIAGNOSIS — Z87891 Personal history of nicotine dependence: Secondary | ICD-10-CM

## 2023-03-19 DIAGNOSIS — J449 Chronic obstructive pulmonary disease, unspecified: Secondary | ICD-10-CM

## 2023-03-19 DIAGNOSIS — J439 Emphysema, unspecified: Secondary | ICD-10-CM | POA: Diagnosis not present

## 2023-03-19 HISTORY — DX: Chronic obstructive pulmonary disease, unspecified: J44.9

## 2023-03-19 MED ORDER — SPIRIVA RESPIMAT 1.25 MCG/ACT IN AERS: 2.0000 | INHALATION_SPRAY | Freq: Every day | RESPIRATORY_TRACT | 1 refills | Status: DC

## 2023-03-19 NOTE — Patient Instructions (Addendum)
ICD-10-CM   1. Pulmonary emphysema, unspecified emphysema type (HCC)  J43.9     2. Former smoker  Z87.891     3. Bronchiectasis without complication (HCC)  J47.9     4. Scl-70 antibody positive  R76.8     5. ANA positive  R76.8     6. Rheumatoid factor positive  R76.8      -There is no evidence of interstitial lung disease according to radiologist in scan oct 2023 - Glad rheumatologist says there is not autoimmune disease -Currently emphysema is the main pulmonary issue associated with very mild bronchiectasis    Plan - Continue  low-dose Spiriva  1.25 mcg @ 2 puff  each tme but only once daily - continue albuterol as needed - do HRCTin 6-7 months  - do spiro dllco in 6-7 mnths - cntinue night o2  - ok to go to [facility] few days without night o2  Followup -6 month routine 15 min followup  - walk test, symptom score at followup

## 2023-03-19 NOTE — Progress Notes (Signed)
OV 08/16/2022  Subjective:  Patient ID: Tina Olson, female , DOB: 01/29/1946 , age 77 y.o. , MRN: [id number] , ADDRESS: [address] [place] [id number] PCP Sharmon ReverePaliwal, Himanshu, MD Patient Care Team: Sharmon ReverePaliwal, Himanshu, MD as PCP - General (Family Medicine) Ginnie SmartHatcher, Jeffrey C, MD as PCP - Infectious Diseases (Infectious Diseases)  This Provider for this visit: Treatment Team:  Attending Provider: Kalman Shanamaswamy, Marcia Hartwell, MD    08/16/2022 -   Chief Complaint  Patient presents with   Consult    Pt is here due to recent CT coronary results.  Pt states that she does have some SOB especially if she goes to the mailbox to bend down to pick something up.     HPI  [name] Comprehensive ILD Questionnaire Tina Olson 77 y.o. -77 y.o. 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 at. Patient has hx of pseudophakia both eyes s/p PCIOL OU with Dr. Dione BoozeGroat and inflammation in the left eye ever since cataract surgery on 06/22/19. She has hx of HIV since 1993 and hx of Syphilis with treatment over 10 year ago. Currently on Valtrex 1g daily and Azathioprine 50 mg daily. Sees Dr [name] t [facility]   Has HIV Hx of HIV since 1993 and hx of Syphilis over 10 years ago  -sees Dr [name]  She is a former smoker who undergoes lung cancer screening as well  She actually has interstitial lung disease mentioned on CT scans in 2020 but she has not been aware of this until she had a cardiac CT in 2023 August.  Therefore she has been referred here.  She tells me that she has had insidious onset of shortness of breath for at least a year and slowly getting worse.  Made worse with exertion and stooping forward.  Improved by rest but there is also variability to it and sometimes it is not there.        Past Medical History :  Is HIV positive and follows with Dr. [name] Has ophthalmologic issues and is immunosuppressed and is on Imuran for at  least a few years.   Positive for longstanding history of acid reflux disease  Denies any history of asthma but somebody apparently told her she has COPD but she is not sure Denies any connective tissue disease or vasculitis Denies any diabetes or stroke or hepatitis or tuberculosis. Denies any kidney disease She has had the COVID-vaccine but has not had COVID.  ROS:  Positive for shortness of breath with exertion.  Not sure she has cough  FAMILY HISTORY of LUNG DISEASE:  -Her biologic second cousin Gaynell Pettyy has IPF and follows with Dr. Isaiah SergeMannam. With family history of asthma  PERSONAL EXPOSURE HISTORY:  She used to smoke at least 20 cigarettes a day.  She quit smoking sometime between 2018 2010.  No marijuana smoking no vaping no cocaine use.  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  She did use it quite a lot  HOME  EXPOSURE and HOBBY DETAILS :  [place] the last 11 years.  Detail organic antigen exposure history in the house is negative  OCCUPATIONAL HISTORY (122 questions) : She [place] in [place] was [place]  Then she moved to [place] and she is lived here since.  She used to work in [employer] and retired  in 2010.  During this time was exposed to steam iron and cleaning equipment but no known organic or inorganic antigen exposure.  PULMONARY TOXICITY HISTORY (27 items):  -Is on Imuran for ophthalmology issues.  INVESTIGATIONS:    CT Chest data  No results found.    PFT     CT chest July 2021 -personally visualized.  Narrative & Impression  CLINICAL DATA:  77 year old asymptomatic female former smoker presents for follow-up of pulmonary nodules.   EXAM: CT CHEST WITHOUT CONTRAST   TECHNIQUE: Multidetector CT imaging of the chest was performed following the standard protocol without intravenous contrast. High resolution imaging of  the lungs, as well as inspiratory and expiratory imaging, was performed.   COMPARISON:  11/19/2019 CT abdomen/pelvis. 04/04/2020 chest radiograph.   FINDINGS: Cardiovascular: Normal heart size. No significant pericardial effusion/thickening. Three-vessel coronary atherosclerosis. Atherosclerotic nonaneurysmal thoracic aorta. Top-normal caliber main pulmonary artery (3.1 cm diameter).   Mediastinum/Nodes: No discrete thyroid nodules. Unremarkable esophagus. No pathologically enlarged axillary, mediastinal or hilar lymph nodes, noting limited sensitivity for the detection of hilar adenopathy on this noncontrast study.   Lungs/Pleura: No pneumothorax. No pleural effusion. Moderate centrilobular and paraseptal emphysema. No acute consolidative airspace disease or lung masses. Previously visualized clustered nodular opacities in the peripheral basilar right lower lobe on 12/08/2019 CT abdomen study have resolved. No significant pulmonary nodules. No significant lobular air trapping or evidence of tracheobronchomalacia on the expiration sequence. Mild patchy subpleural reticulation and ground-glass opacity with subpleural lines in the lower lungs bilaterally without significant regions of traction bronchiectasis, architectural distortion or frank honeycombing. These findings of not substantially changed at the lung bases since 12/08/2019 CT abdomen study.   Upper abdomen: No acute abnormality.   Musculoskeletal: No aggressive appearing focal osseous lesions. Mild thoracic spondylosis.   IMPRESSION: 1. No significant pulmonary nodules. Previously visualized clustered nodular opacities in the peripheral basilar right lower lobe on 12/08/2019 CT abdomen study have resolved. 2. Mild patchy subpleural reticulation and ground-glass opacity with subpleural lines in the lower lungs bilaterally without bronchiectasis or honeycombing, unchanged since 12/08/2019 CT abdomen study. Mild  interstitial lung disease such as NSIP or early UIP not excluded. Findings are indeterminate for UIP per consensus guidelines: Diagnosis of Idiopathic Pulmonary Fibrosis: An Official ATS/ERS/JRS/ALAT Clinical Practice Guideline. Am Rosezetta Schlatter Crit Care Med Vol 198, Iss 5, [phone], Aug 10 2017. 3. Three-vessel coronary atherosclerosis. 4. Aortic Atherosclerosis (ICD10-I70.0) and Emphysema (ICD10-J43.9).     Electronically Signed   By: Delbert Phenix M.D.   On: 06/16/2020 11:54     09/06/2022: Today-acute Patient presents today for acute visit.  She had contacted the office on 9/25 reported that she was having increased shortness of breath and fatigue.  Felt like her breathing was limiting what all she could do.  She was advised to go to the ED or come in for acute visit.  Today, she reports feeling relatively unchanged.  She cannot exactly identify when she started feeling worse after seeing Dr. Marchelle Gearing last.  Feels like it has been a few weeks.  She tells me that she just feels like she has progressively declined over the past 6 months to a year, and then within the past few weeks has noticed that she cannot walk as far without having to stop to rest.  She has an occasional dry cough.  Does feel like she has some chest congestion but cannot produce any sputum.  No significant wheezing.  She denies any fevers, night sweats, chills, hemoptysis, calf pain  or swelling.  She has been told in the past that she has COPD.  She has never been on any inhalers for this. Her autoimmune/ILD panel recently completed was significant for positive scleroderma antibody, ANA and rheumatoid factor.  She also had elevated ESR.  She denies any significant joint pain.  Occasionally has some mild pain in her right hip, which she reports is sciatica.  Denies any joint pain or swelling.  She occasionally has some numbness in her hands; does not feel like they are tight.  No significant skin discoloration or changes.  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  She was told by her ophthalmologist that it could possibly be related to an underlying autoimmune disease but she was never evaluated by rheumatology for this.  She is currently on Imuran   OV 10/09/2022  Subjective:  Patient ID: Tina Hailstone, female , DOB: 08-03-1946 , age 20 y.o. , MRN: [id number] , ADDRESS: [address] [place] [address] PCP Sharmon Revere, MD Patient Care Team: Sharmon Revere, MD as PCP - General (Family Medicine) [name], MD as PCP - Infectious Diseases (Infectious Diseases)  This Provider for this visit: Treatment Team:  Attending Provider: Kalman Shan, MD    10/09/2022 -   Chief Complaint  Patient presents with   Office Visit    PFT today PT on 1.5L O2 at night, breathing better during the day    HPI Tina Hailstone 77 y.o. -returns for follow-up.  Here to go over test results.  Overall she feels stable.  I was fairly convinced based on low-dose CT scan of the chest that she had interstitial lung disease but she had high-resolution CT chest and thoracic radiologist Dr. Fredirick Lathe says she has emphysema and some very mild bronchiectasis.  There is no ILD.  Her pulmonary function test only shows slight isolated reduction in diffusion capacity but is otherwise normal.  I did share this news with her and she is quite excited about it.  However serology shows positivity for scleroderma antibody [she admits to thickening of the skin is on the dorsum of her palm], mild ANA positivity and mild rheumatoid factor positivity.  She is not aware of these from before.  She says she has a rheumatology appointment pending.  Review of the records indicate appointment Dr. Dimple Casey in January 2024.  Of note: Because of emphysema last visit Katie Cobb put her on Spiriva higher dose which she is supposed to take only 2 puffs once daily but she was taking 2 puffs 2 times daily and a  week after this even though she started feeling better from a shortness of breath perspective started have tingling and call 911 and stop.  She was not aware that she only needed to take it once daily.  After hearing that she is willing to take the low-dose only once daily and then reassess.     In terms of HIV she has upcoming appointment with Dr. Ninetta Lights in December 2023.  Last CD4 count was in March 2023  She has SVTs and she seen Dr. Broadus John in July 2023     OV 03/19/2023  Subjective:  Patient ID: Tina Hailstone, female , DOB: 06-19-1946 , age 77 y.o. , MRN: [id number] , ADDRESS: [address] [place] [address] PCP Sharmon Revere, MD Patient Care Team: Sharmon Revere, MD as PCP - General (Family Medicine) [name], MD as PCP - Infectious Diseases (Infectious Diseases)  This Provider for this visit: Treatment Team:  Attending Provider: Kalman Shan, MD    03/19/2023 -   Chief Complaint  Patient presents with   Follow-up    Doing well.  Spiriva helping with SOB.  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  - Macular pucker oof left retinae  - hx of pseudophakia both eyes s/p PCIOL OU with Dr. Dione Booze and inflammation in the left eye ever since cataract surgery on 06/22/19.  - Currently on Valtrex 1g daily and Azathioprine 50 mg daily.   She has hx of HIV since 1993 andCD4 count 388 02/15/2022   hx of Syphilis with treatment over 10 year ago.   Has HIV Hx of HIV since 1993 and hx of Syphilis over 10 years ago  -sees Dr Ninetta Lights  -CD4 353/5/24  She is a former smoker > 30 ppd -  who undergoes lung cancer screening as well  Zio patch x2 weeks 05/04/2022 showed 37 episodes of SVT, longest lasting 17 beats with average rate 118 bpm, occasional PACs (1.4% of beats).  Echocardiogram 05/09/2022 showed EF 55 to 60%, mild LVH, normal diastolic function, normal RV function, no significant valvular  disease  -Dr. Jacelyn Pi last visit July 2023   GERD:   -Unclear treatment  Concern for ILD but ILD ruled out October 2023  - interstitial lung disease mentioned on CT scans in 2020 but she has not been aware of this until she had a cardiac CT in 2023 August.  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 Sheliah Hatch early 2024 visit.   HPI Jodene Polyak 77 y.o. -returns for follow-up.  From a shortness of breath perspective she is stable.  She says Virgel Bouquet is actually helping her.  In fact symptom scores have improved compared to October 2023.  She says she is doing well.  Review of the records indicate January 2024 she saw Dr. Sheliah Hatch rheumatology who is reassured her about the absence of systemic connective tissue disease.  He felt that the autoimmune antibodies could be false positive potentially.  She is also seen Dr. Johny Sax of infectious diseases 02/26/2023 CD4 counts were normal upon my review.  Mid March 2024 she also saw Dr. Sherryll Burger her ophthalmologist and has been reassured she did indicate that she is getting some bladder scan and abdominal imaging coming up.  But otherwise she is well.  Today on exam I did hear right greater than left lower lobe crackles.  But her symptom score is better and her walking desaturation test is stable./Normal.  Of note she uses her night oxygen.  She wants to know if she can go to [place] for a few days without portable oxygen on night oxygen.  I reassured her that she could do that.  She also thinks that at 8 PM she needs to put her night oxygen on.  I did indicate to her she only needs to do it when she is going to bed and she is allowed to go to church etc. without oxygen.   SYMPTOM  SCALE - ILD 08/16/2022 10/09/2022  03/19/2023   Current weight     O2 use ra  ra ra  Shortness of Breath 0 -> 5 scale with 5 being worst (score 6 If unable to do)    At rest 0 2 0  Simple tasks - showers, clothes change, eating, shaving 0 3 1  Household (dishes, doing bed, laundry) 3 3 2.5  Shopping 2 4 2.5  Walking level at own pace 2 3 2   Walking up Stairs 3 4 2.5  Total (30-36) Dyspnea Score 10 18 10.5      Non-dyspnea symptoms (0-> 5 scale) 08/16/2022 10/09/2022  03/19/2023   How bad is your cough? 0 0 2  How bad is your fatigue 2 4 2   How bad is nausea 0 2 0  How bad is vomiting?  0 0 0  How bad is diarrhea? 0 0 0  How bad is anxiety? 1 1 2   How bad is depression 2 1 1.5  Any chronic pain - if so where and how bad 0 x x     Simple office walk 185 feet x  3 laps goal with forehead probe 10/09/2022  03/19/2023 224 feet  O2 used ra ra  Number laps completed 3 3  Comments about pace avg   Resting Pulse Ox/HR 100% and 70/min 99% and HR 60  Final Pulse Ox/HR 100% and 75/min 96% ad HR 64  Desaturated </= 88% no   Desaturated <= 3% points no   Got Tachycardic >/= 90/min no   Symptoms at end of test xx   Miscellaneous comments x      PFT     Latest Ref Rng & Units 10/09/2022    8:35 AM  PFT Results  FVC-Pre L 2.25   FVC-Predicted Pre % 94   FVC-Post L 2.46   FVC-Predicted Post % 103   Pre FEV1/FVC % % 66   Post FEV1/FCV % % 81   FEV1-Pre L 1.48   FEV1-Predicted Pre % 83   FEV1-Post L 1.99   DLCO uncorrected ml/min/mmHg 8.44   DLCO UNC% % 50   DLCO corrected ml/min/mmHg 8.44   DLCO COR %Predicted % 50   DLVA Predicted % 44   TLC L 5.32   TLC % Predicted % 119   RV % Predicted % 133      Modified Six Minute Walk - 03/19/23 0900     Type of O2 used  Room Air    Number of laps completed  3    Lap Pace Brisk    Resting Heartrate 60 bpm    Final Heartrate 74 bpm    Resting Pulse Ox 99 %    Desaturated to <= 3 points Yes   from 99% to 96% = 3%   Desaturated  to < 88% No    Became tachycardic Yes   HR from 60 bpm to 74 bpm   Symptoms  no SOB or dyspnea noted.    Was the O2 correction test done? No    comments SaO2/HR = LAP 1=98%/72     LAP 2=96%/71     LAP3= 96%/74               has a past medical history of Acid reflux, Anemia (2017), Difficult intravenous access, Hepatitis C (07/2014), History of blood transfusion (2017), HIV infection (since 1993), Hypertension, Internal hemorrhoid, and Right inguinal hernia.   reports that she quit smoking about 14 years ago. Her smoking use included cigarettes. She has a 33.75 pack-year smoking history. She has been exposed to tobacco smoke. She  has never used smokeless tobacco.  Past Surgical History:  Procedure Laterality Date   BREAST BIOPSY Right 07/26/2008   CATARACT EXTRACTION Right 2016   CATARACT EXTRACTION Left 06/2019   COLONOSCOPY WITH PROPOFOL N/A 06/11/2017   Procedure: COLONOSCOPY WITH PROPOFOL;  Surgeon: Napoleon Form, MD;  Location: [facility];  Service: Endoscopy;  Laterality: N/A;  PT IS KNOWN HEART STICK. WILL PROBABLY REQUIRE ULTRASOUND OR ANESTHESIA FOR IV STICK.   ESOPHAGOGASTRODUODENOSCOPY (EGD) WITH PROPOFOL N/A 06/11/2017   Procedure: ESOPHAGOGASTRODUODENOSCOPY (EGD) WITH PROPOFOL;  Surgeon: Napoleon Form, MD;  Location: [facility];  Service: Endoscopy;  Laterality: N/A;    Allergies  Allergen Reactions   Iodinated Contrast Media Hives and Itching   Lisinopril Itching and Cough   Other Hives and Itching     Seafood crustaceans  "itchy throat"    Immunization History  Administered Date(s) Administered   COVID-19, mRNA, vaccine(Comirnaty)12 years and older 11/14/2022   Fluad Quad(high Dose 65+) 01/07/2020, 02/15/2022, 09/19/2022   Hepatitis A 09/22/2010, 05/02/2011   Hepatitis B 05/10/2008, 06/09/2008, 10/11/2008   Hepatitis B, ADULT 10/14/2013, 11/13/2013, 05/17/2014   Influenza Split 10/08/2011, 09/18/2012   Influenza Whole 10/11/2008, 10/20/2009,  09/22/2010, 10/25/2014   Influenza,inj,Quad PF,6+ Mos 08/03/2013, 11/04/2017, 09/24/2018   Influenza-Unspecified 10/24/2015, 10/17/2016, 11/24/2020   Moderna SARS-COV2 Booster Vaccination 09/26/2022   PFIZER(Purple Top)SARS-COV-2 Vaccination 02/05/2020, 03/02/2020, 11/01/2020   Pneumococcal Conjugate-13 09/24/2018   Pneumococcal Polysaccharide-23 09/09/2008, 04/01/2013, 01/07/2020    Family History  Problem Relation Age of Onset   Alzheimer's disease Mother    Glaucoma Mother    Hypertension Mother    Cancer Father        ? type   Seizures Son      Current Outpatient Medications:    acetaminophen (TYLENOL) 325 MG tablet, Take 325 mg by mouth daily as needed for moderate pain or headache., Disp: , Rfl:    amLODipine (NORVASC) 10 MG tablet, Take 10 mg by mouth daily., Disp: , Rfl:    Ascorbic Acid (VITAMIN C PO), Take 1 tablet by mouth daily., Disp: , Rfl:    aspirin 81 MG chewable tablet, Chew 1 tablet by mouth daily., Disp: , Rfl:    azaTHIOprine (IMURAN) 50 MG tablet, Take 25 mg by mouth daily., Disp: , Rfl:    BIOTIN PO, Take 500 mcg by mouth daily., Disp: , Rfl:    calcium carbonate (OSCAL) 1500 (600 Ca) MG TABS tablet, Take 1,500 mg by mouth daily with breakfast., Disp: , Rfl:    Cyanocobalamin (VITAMIN B12 PO), Take 1 tablet by mouth daily., Disp: , Rfl:    docusate sodium (COLACE) 100 MG capsule, Take 100 mg by mouth as needed for mild constipation., Disp: , Rfl:    Misc Natural Products (NEURIVA PO), Take 1 capsule by mouth daily., Disp: , Rfl:    OXYGEN, Inhale 1.5 L into the lungs at bedtime., Disp: , Rfl:    polyethylene glycol (MIRALAX / GLYCOLAX) packet, Take 17 g by mouth every other day., Disp: , Rfl:    rosuvastatin (CRESTOR) 20 MG tablet, Take 20 mg by mouth in the morning., Disp: , Rfl:    Tiotropium Bromide Monohydrate (SPIRIVA RESPIMAT) 1.25 MCG/ACT AERS, Inhale 2 puffs into the lungs daily., Disp: 4 g, Rfl: 11   valACYclovir (VALTREX) 1000 MG tablet, Take 1,000  mg by mouth daily. , Disp: , Rfl:    Zinc 50 MG TABS, Take 50 mg by mouth daily., Disp: , Rfl:    ferrous sulfate 325 (65  FE) MG tablet, Take 1 tablet (325 mg total) by mouth daily., Disp: 30 tablet, Rfl: 0      Objective:   Vitals:   03/19/23 0942  BP: 134/64  Pulse: (!) 56  Temp: 98.7 F (37.1 C)  TempSrc: Oral  SpO2: 97%  Weight: 129 lb (58.5 kg)  Height: 5' (1.524 m)    Estimated body mass index is 25.19 kg/m as calculated from the following:   Height as of this encounter: 5' (1.524 m).   Weight as of this encounter: 129 lb (58.5 kg).  @WEIGHTCHANGE @  [employer]   03/19/23 0942  Weight: 129 lb (58.5 kg)     Physical Exam   General: No distress. Looks well Neuro: Alert and Oriented x 3. GCS 15. Speech normal Psych: Pleasant Resp:  Barrel Chest - no.  Wheeze - no, Crackles - R > L crackles, No overt respiratory distress CVS: Normal heart sounds. Murmurs - no Ext: Stigmata of Connective Tissue Disease - no HEENT: Normal upper airway. PEERL +. No post nasal drip        Assessment:     No diagnosis found.     Plan:     Patient Instructions     ICD-10-CM   1. Pulmonary emphysema, unspecified emphysema type (HCC)  J43.9     2. Former smoker  Z87.891     3. Bronchiectasis without complication (HCC)  J47.9     4. Scl-70 antibody positive  R76.8     5. ANA positive  R76.8     6. Rheumatoid factor positive  R76.8      -There is no evidence of interstitial lung disease according to radiologist but I believe you are at risk of developing it in the future because of your autoimmune profile  -Currently emphysema is the main pulmonary issue associated with very mild bronchiectasis  -Noted that high-dose Spiriva taken excessively at 2 puffs 2 times daily cause side effects  Plan - Take low-dose Spiriva  1.25 mcg @ 2 puff  each tme but only once daily  - ensure you have followup with a rheumatologist - ensure you see ID doctor - stop fish oil -  makes GERD worse  Followup -6 month routine 15 min followup  - walk test, symptom score at followup    SIGNATURE    Dr. Kalman Shan, M.D., F.C.C.P,  Pulmonary and Critical Care Medicine Staff Physician, [facility] Center Director - Interstitial Lung Disease  Program  Pulmonary Fibrosis [facility] [place], [place], [address]  Pager: [phone], If no answer or between  15:00h - 7:00h: call [phone] Telephone: [phone]  10:16 AM 03/19/2023

## 2023-03-20 ENCOUNTER — Ambulatory Visit
Admission: RE | Admit: 2023-03-20 | Discharge: 2023-03-20 | Disposition: A | Discharge status: Home / Self Care | Payer: 59 | Source: Ambulatory Visit | Attending: Family Medicine | Admitting: Family Medicine

## 2023-03-20 ENCOUNTER — Other Ambulatory Visit: Payer: Self-pay | Admitting: Family Medicine

## 2023-03-20 DIAGNOSIS — R1011 Right upper quadrant pain: Secondary | ICD-10-CM

## 2023-07-08 NOTE — Progress Notes (Signed)
Tina Olson    [id number]    [birth year]  Primary Care Physician:Paliwal, Alcario Drought, MD  Referring Physician: Sharmon Revere, MD [address] [place],  [place] [address]   Chief complaint: No chief complaint on file.   HPI: Tina Olson is a 77 y.o. female presenting to clinic today for consult for reflux and bloating.  She was last seen on 06-14-21 by PA Zehr for constipation and anemia.   Today,   GI Hx:  US Abdominal Complete 03-20-23 1. No cholelithiasis or sonographic evidence for acute cholecystitis. 2. Increased cortical echogenicity as can be seen in medical renal disease.  EGD 06/2017   - LA Grade B reflux esophagitis. - Benign-appearing esophageal stenosis. - Non-bleeding grade I and small (< 5 mm) esophageal varices. - Gastritis. - Normal examined duodenum. - No specimens collected.   Colonoscopy showed only nonbleeding internal hemorrhoids.      Current Outpatient Medications:    acetaminophen (TYLENOL) 325 MG tablet, Take 325 mg by mouth daily as needed for moderate pain or headache., Disp: , Rfl:    amLODipine (NORVASC) 10 MG tablet, Take 10 mg by mouth daily., Disp: , Rfl:    Ascorbic Acid (VITAMIN C PO), Take 1 tablet by mouth daily., Disp: , Rfl:    aspirin 81 MG chewable tablet, Chew 1 tablet by mouth daily., Disp: , Rfl:    azaTHIOprine (IMURAN) 50 MG tablet, Take 25 mg by mouth daily., Disp: , Rfl:    BIOTIN PO, Take 500 mcg by mouth daily., Disp: , Rfl:    calcium carbonate (OSCAL) 1500 (600 Ca) MG TABS tablet, Take 1,500 mg by mouth daily with breakfast., Disp: , Rfl:    Cyanocobalamin (VITAMIN B12 PO), Take 1 tablet by mouth daily., Disp: , Rfl:    docusate sodium (COLACE) 100 MG capsule, Take 100 mg by mouth as needed for mild constipation., Disp: , Rfl:    ferrous sulfate 325 (65 FE) MG tablet, Take 1 tablet (325 mg total) by mouth daily., Disp: 30 tablet, Rfl: 0   Misc Natural Products (NEURIVA PO), Take 1 capsule  by mouth daily., Disp: , Rfl:    OXYGEN, Inhale 1.5 L into the lungs at bedtime., Disp: , Rfl:    polyethylene glycol (MIRALAX / GLYCOLAX) packet, Take 17 g by mouth every other day., Disp: , Rfl:    rosuvastatin (CRESTOR) 20 MG tablet, Take 20 mg by mouth in the morning., Disp: , Rfl:    Tiotropium Bromide Monohydrate (SPIRIVA RESPIMAT) 1.25 MCG/ACT AERS, Inhale 2 puffs into the lungs daily., Disp: 12 g, Rfl: 1   valACYclovir (VALTREX) 1000 MG tablet, Take 1,000 mg by mouth daily. , Disp: , Rfl:    Zinc 50 MG TABS, Take 50 mg by mouth daily., Disp: , Rfl:    Allergies as of 07/11/2023 - Review Complete 03/19/2023  Allergen Reaction Noted   Iodinated contrast media Hives and Itching 07/23/2022   Lisinopril Itching and Cough 02/25/2012   Other Hives and Itching 07/07/2012    Past Medical History:  Diagnosis Date   Acid reflux    Anemia 2017   Difficult intravenous access    Hepatitis C 07/2014   took harvani tx    History of blood transfusion 2017   2 units   HIV infection (HCC) since 1993   Hypertension    Internal hemorrhoid    Right inguinal hernia     Past Surgical History:  Procedure  Laterality Date   BREAST BIOPSY Right 07/26/2008   CATARACT EXTRACTION Right 2016   CATARACT EXTRACTION Left 06/2019   COLONOSCOPY WITH PROPOFOL N/A 06/11/2017   Procedure: COLONOSCOPY WITH PROPOFOL;  Surgeon: Napoleon Form, MD;  Location: [facility];  Service: Endoscopy;  Laterality: N/A;  PT IS KNOWN HEART STICK. WILL PROBABLY REQUIRE ULTRASOUND OR ANESTHESIA FOR IV STICK.   ESOPHAGOGASTRODUODENOSCOPY (EGD) WITH PROPOFOL N/A 06/11/2017   Procedure: ESOPHAGOGASTRODUODENOSCOPY (EGD) WITH PROPOFOL;  Surgeon: Napoleon Form, MD;  Location: [facility];  Service: Endoscopy;  Laterality: N/A;    Family History  Problem Relation Age of Onset   Alzheimer's disease Mother    Glaucoma Mother    Hypertension Mother    Cancer Father        ? type   Seizures Son     Social History    Socioeconomic History   Marital status: Widowed    Spouse name: Not on file   Number of children: 1   Years of education: Not on file   Highest education level: Not on file  Occupational History   Not on file  Tobacco Use   Smoking status: Former    Current packs/day: 0.00    Average packs/day: 0.8 packs/day for 45.0 years (33.8 ttl pk-yrs)    Types: Cigarettes    Start date: 41    Quit date: 2010    Years since quitting: 14.5    Passive exposure: Past   Smokeless tobacco: Never  Vaping Use   Vaping status: Never Used  Substance and Sexual Activity   Alcohol use: No    Alcohol/week: 0.0 standard drinks of alcohol   Drug use: No    Types: IV    Comment: clean since around 2000   Sexual activity: Not Currently    Comment: pt. declined condoms  Other Topics Concern   Not on file  Social History Narrative   Not on file   Social Determinants of Health   Financial Resource Strain: Low Risk  (02/26/2023)   Overall Financial Resource Strain (CARDIA)    Difficulty of Paying Living Expenses: Not hard at all  Food Insecurity: No Food Insecurity (02/26/2023)   Hunger Vital Sign    Worried About Running Out of Food in the Last Year: Never true    Ran Out of Food in the Last Year: Never true  Transportation Needs: No Transportation Needs (02/26/2023)   PRAPARE - Administrator, Civil Service (Medical): No    Lack of Transportation (Non-Medical): No  Physical Activity: Inactive (02/26/2023)   Exercise Vital Sign    Days of Exercise per Week: 0 days    Minutes of Exercise per Session: 0 min  Stress: No Stress Concern Present (02/26/2023)   [employer] of Occupational Health - Occupational Stress Questionnaire    Feeling of Stress : Not at all  Social Connections: Moderately Integrated (02/26/2023)   Social Connection and Isolation Panel [NHANES]    Frequency of Communication with Friends and Family: More than three times a week    Frequency of Social  Gatherings with Friends and Family: More than three times a week    Attends Religious Services: More than 4 times per year    Active Member of [employer] or Organizations: Yes    Attends Banker Meetings: More than 4 times per year    Marital Status: Widowed  Intimate Partner Violence: Not At Risk (02/26/2023)   Humiliation, Afraid, Rape, and Kick questionnaire  Fear of Current or Ex-Partner: No    Emotionally Abused: No    Physically Abused: No    Sexually Abused: No      Review of systems: Review of Systems    Physical Exam: There were no vitals filed for this visit. There is no height or weight on file to calculate BMI.  General: well-appearing ***  Eyes: sclera anicteric, no redness ENT: oral mucosa moist without lesions, no cervical or supraclavicular lymphadenopathy CV: RRR, no JVD, no peripheral edema Resp: clear to auscultation bilaterally, normal RR and effort noted GI: soft, no tenderness, with active bowel sounds. No guarding or palpable organomegaly noted. Skin; warm and dry, no rash or jaundice noted Neuro: awake, alert and oriented x 3. Normal gross motor function and fluent speech   Data Reviewed:  Reviewed labs, radiology imaging, old records and pertinent past GI work up   Assessment and Plan/Recommendations:  ***  This visit required *** minutes of patient care (this includes precharting, chart review, review of results, face-to-face time used for counseling as well as treatment plan and follow-up. The patient was provided an opportunity to ask questions and all were answered. The patient agreed with the plan and demonstrated an understanding of the instructions.  Iona Beard , MD  CC: Sharmon Revere, MD   Ladona Mow Hewitt Shorts as a scribe for Marsa Aris, MD.,have documented all relevant documentation on the behalf of Marsa Aris, MD,as directed by  Marsa Aris, MD while in the presence of Marsa Aris,  MD.   I, Marsa Aris, MD, have reviewed all documentation for this visit. The documentation on 07/08/23 for the exam, diagnosis, procedures, and orders are all accurate and complete.

## 2023-07-11 ENCOUNTER — Ambulatory Visit (INDEPENDENT_AMBULATORY_CARE_PROVIDER_SITE_OTHER): Payer: 59 | Admitting: Gastroenterology

## 2023-07-11 ENCOUNTER — Encounter: Payer: Self-pay | Admitting: Gastroenterology

## 2023-07-11 VITALS — BP 112/62 | HR 62 | Ht 60.0 in | Wt 132.0 lb

## 2023-07-11 DIAGNOSIS — K5900 Constipation, unspecified: Secondary | ICD-10-CM

## 2023-07-11 DIAGNOSIS — K648 Other hemorrhoids: Secondary | ICD-10-CM

## 2023-07-11 DIAGNOSIS — K2100 Gastro-esophageal reflux disease with esophagitis, without bleeding: Secondary | ICD-10-CM | POA: Diagnosis not present

## 2023-07-11 DIAGNOSIS — D509 Iron deficiency anemia, unspecified: Secondary | ICD-10-CM

## 2023-07-11 DIAGNOSIS — R1013 Epigastric pain: Secondary | ICD-10-CM | POA: Diagnosis not present

## 2023-07-11 DIAGNOSIS — R140 Abdominal distension (gaseous): Secondary | ICD-10-CM

## 2023-07-11 DIAGNOSIS — K5904 Chronic idiopathic constipation: Secondary | ICD-10-CM

## 2023-07-11 MED ORDER — OMEPRAZOLE 40 MG PO CPDR: 40.0000 mg | DELAYED_RELEASE_CAPSULE | Freq: Every day | ORAL | 3 refills | Status: DC

## 2023-07-11 NOTE — Patient Instructions (Addendum)
You have been scheduled for an endoscopy. Please follow written instructions given to you at your visit today.  If you use inhalers (even only as needed), please bring them with you on the day of your procedure.  If you take any of the following medications, they will need to be adjusted prior to your procedure:   DO NOT TAKE 7 DAYS PRIOR TO TEST- Trulicity (dulaglutide) Ozempic, Wegovy (semaglutide) Mounjaro (tirzepatide) Bydureon Bcise (exanatide extended release)  DO NOT TAKE 1 DAY PRIOR TO YOUR TEST Rybelsus (semaglutide) Adlyxin (lixisenatide) Victoza (liraglutide) Byetta (exanatide) ___________________________________________________________________________   We have sent the following medications to your pharmacy for you to pick up at your convenience:  Omeprazole   Use Miralax 1 capful alternate with 1/2 capful daily  Gastroesophageal Reflux Disease, Adult Gastroesophageal reflux (GER) happens when acid from the stomach flows up into the tube that connects the mouth and the stomach (esophagus). Normally, food travels down the esophagus and stays in the stomach to be digested. However, when a person has GER, food and stomach acid sometimes move back up into the esophagus. If this becomes a more serious problem, the person may be diagnosed with a disease called gastroesophageal reflux disease (GERD). GERD occurs when the reflux: Happens often. Causes frequent or severe symptoms. Causes problems such as damage to the esophagus. When stomach acid comes in contact with the esophagus, the acid may cause inflammation in the esophagus. Over time, GERD may create small holes (ulcers) in the lining of the esophagus. What are the causes? This condition is caused by a problem with the muscle between the esophagus and the stomach (lower esophageal sphincter, or LES). Normally, the LES muscle closes after food passes through the esophagus to the stomach. When the LES is weakened or abnormal,  it does not close properly, and that allows food and stomach acid to go back up into the esophagus. The LES can be weakened by certain dietary substances, medicines, and medical conditions, including: Tobacco use. Pregnancy. Having a hiatal hernia. Alcohol use. Certain foods and beverages, such as coffee, chocolate, onions, and peppermint. What increases the risk? You are more likely to develop this condition if you: Have an increased body weight. Have a connective tissue disorder. Take NSAIDs, such as ibuprofen. What are the signs or symptoms? Symptoms of this condition include: Heartburn. Difficult or painful swallowing and the feeling of having a lump in the throat. A bitter taste in the mouth. Bad breath and having a large amount of saliva. Having an upset or bloated stomach and belching. Chest pain. Different conditions can cause chest pain. Make sure you see your health care provider if you experience chest pain. Shortness of breath or wheezing. Ongoing (chronic) cough or a nighttime cough. Wearing away of tooth enamel. Weight loss. How is this diagnosed? This condition may be diagnosed based on a medical history and a physical exam. To determine if you have mild or severe GERD, your health care provider may also monitor how you respond to treatment. You may also have tests, including: A test to examine your stomach and esophagus with a small camera (endoscopy). A test that measures the acidity level in your esophagus. A test that measures how much pressure is on your esophagus. A barium swallow or modified barium swallow test to show the shape, size, and functioning of your esophagus. How is this treated? Treatment for this condition may vary depending on how severe your symptoms are. Your health care provider may recommend: Changes to your  diet. Medicine. Surgery. The goal of treatment is to help relieve your symptoms and to prevent complications. Follow these instructions  at home: Eating and drinking  Follow a diet as recommended by your health care provider. This may involve avoiding foods and drinks such as: Coffee and tea, with or without caffeine. Drinks that contain alcohol. Energy drinks and sports drinks. Carbonated drinks or sodas. Chocolate and cocoa. Peppermint and mint flavorings. Garlic and onions. Horseradish. Spicy and acidic foods, including peppers, chili powder, curry powder, vinegar, hot sauces, and barbecue sauce. Citrus fruit juices and citrus fruits, such as oranges, lemons, and limes. Tomato-based foods, such as red sauce, chili, salsa, and pizza with red sauce. Fried and fatty foods, such as donuts, french fries, potato chips, and high-fat dressings. High-fat meats, such as hot dogs and fatty cuts of red and white meats, such as rib eye steak, sausage, ham, and bacon. High-fat dairy items, such as whole milk, butter, and cream cheese. Eat small, frequent meals instead of large meals. Avoid drinking large amounts of liquid with your meals. Avoid eating meals during the 2-3 hours before bedtime. Avoid lying down right after you eat. Do not exercise right after you eat. Lifestyle  Do not use any products that contain nicotine or tobacco. These products include cigarettes, chewing tobacco, and vaping devices, such as e-cigarettes. If you need help quitting, ask your health care provider. Try to reduce your stress by using methods such as yoga or meditation. If you need help reducing stress, ask your health care provider. If you are overweight, reduce your weight to an amount that is healthy for you. Ask your health care provider for guidance about a safe weight loss goal. General instructions Pay attention to any changes in your symptoms. Take over-the-counter and prescription medicines only as told by your health care provider. Do not take aspirin, ibuprofen, or other NSAIDs unless your health care provider told you to take these  medicines. Wear loose-fitting clothing. Do not wear anything tight around your waist that causes pressure on your abdomen. Raise (elevate) the head of your bed about 6 inches (15 cm). You can use a wedge to do this. Avoid bending over if this makes your symptoms worse. Keep all follow-up visits. This is important. Contact a health care provider if: You have: New symptoms. Unexplained weight loss. Difficulty swallowing or it hurts to swallow. Wheezing or a persistent cough. A hoarse voice. Your symptoms do not improve with treatment. Get help right away if: You have sudden pain in your arms, neck, jaw, teeth, or back. You suddenly feel sweaty, dizzy, or light-headed. You have chest pain or shortness of breath. You vomit and the vomit is green, yellow, or black, or it looks like blood or coffee grounds. You faint. You have stool that is red, bloody, or black. You cannot swallow, drink, or eat. These symptoms may represent a serious problem that is an emergency. Do not wait to see if the symptoms will go away. Get medical help right away. Call your local emergency services (911 in the [place] Do not drive yourself to the hospital. Summary Gastroesophageal reflux happens when acid from the stomach flows up into the esophagus. GERD is a disease in which the reflux happens often, causes frequent or severe symptoms, or causes problems such as damage to the esophagus. Treatment for this condition may vary depending on how severe your symptoms are. Your health care provider may recommend diet and lifestyle changes, medicine, or surgery. Contact a health  care provider if you have new or worsening symptoms. Take over-the-counter and prescription medicines only as told by your health care provider. Do not take aspirin, ibuprofen, or other NSAIDs unless your health care provider told you to do so. Keep all follow-up visits as told by your health care provider. This is important. This information is not  intended to replace advice given to you by your health care provider. Make sure you discuss any questions you have with your health care provider. Document Revised: 06/04/2020 Document Reviewed: 06/06/2020 Elsevier Patient Education  2024 [employer].  Due to recent changes in healthcare laws, you may see the results of your imaging and laboratory studies on MyChart before your provider has had a chance to review them.  We understand that in some cases there may be results that are confusing or concerning to you. Not all laboratory results come back in the same time frame and the provider may be waiting for multiple results in order to interpret others.  Please give [place] 48 hours in order for your provider to thoroughly review all the results before contacting the office for clarification of your results.    I appreciate the  opportunity to care for you  Thank You   Marsa Aris , MD

## 2023-07-22 ENCOUNTER — Ambulatory Visit
Admission: RE | Admit: 2023-07-22 | Discharge: 2023-07-22 | Disposition: A | Discharge status: Home / Self Care | Payer: 59 | Source: Ambulatory Visit | Attending: Family Medicine | Admitting: Family Medicine

## 2023-07-22 DIAGNOSIS — Z87891 Personal history of nicotine dependence: Secondary | ICD-10-CM

## 2023-07-22 DIAGNOSIS — Z122 Encounter for screening for malignant neoplasm of respiratory organs: Secondary | ICD-10-CM

## 2023-07-26 ENCOUNTER — Telehealth: Payer: Self-pay | Admitting: Acute Care

## 2023-07-26 DIAGNOSIS — Z122 Encounter for screening for malignant neoplasm of respiratory organs: Secondary | ICD-10-CM

## 2023-07-26 DIAGNOSIS — Z87891 Personal history of nicotine dependence: Secondary | ICD-10-CM

## 2023-07-26 NOTE — Telephone Encounter (Signed)
I have attempted to call the patient with the results of her low-dose screening CT. There was no answer and no option to leave a message. There is only one number to call.    Screening scan was done 07/22/2023 and read 07/26/2023.  From a lung cancer perspective, scan was read as a lung RADS 2>> 10-month annual follow-up.  There was notation of new mild right greater than left groundglass opacities that are likely infectious or inflammatory.  Additionally there is a new small solid nodule of the right lower lobe measuring 3.3 mm.  Incidental finding of possible increasing focal asymmetric soft tissue of the left breast measuring 11 millimeters.  This was possibly present on previous imaging however it appears to have increased in size. Recommendation is for a mammogram to further evaluate.  There was also notation of coronary artery calcifications and aortic atherosclerosis.She is on statin therapy.  We will continue to try to get in touch with the patient.  I have sent a  message, and faxed results  to her PCP to make sure they are aware they need to arrange for mammogram. Please follow up with them Monday and make sure they received the fax, and that they will arrange for mammogram. Thanks so much

## 2023-07-26 NOTE — Telephone Encounter (Signed)
I was able to get in touch with Tina Olson regarding her lung cancer screening. I explained her scan was read as a lung RADS 2, from a lung cancer perspective she just needs a 62-month follow-up. I explained that there was notation of new right greater than left groundglass opacities that are likely infectious or inflammatory.  She does have a 3.3 mm nodule in the right lower lobe. I did pass along to her that there was an incidental finding of a possibly increasing focal asymmetric soft tissue of the left breast that measured 11 mm.  Per radiology this was present on previous imaging but appears to have increased in size.  They recommended mammogram to further evaluate. Patient states she has had this area biopsied in the past, she also acknowledges that it is time for her annual mammogram. Patient states she will call to schedule her annual mammogram. I explained to her that I have sent a message to her primary care doctor to make them aware of this finding. She verbalized understanding and had no further questions at completion of the call. Tina Olson and Tina Olson please place order for 66-month follow-up low-dose screening CT I have already faxed results to PCP. Thank you

## 2023-07-26 NOTE — Telephone Encounter (Signed)
Call repot please call back at [phone]

## 2023-07-26 NOTE — Progress Notes (Signed)
 CHIEF COMPLAINT Chief Complaint  Patient presents with  . Follow Up Exam      HISTORY OF PRESENT ILLNESS: Tina Olson is a 77 y.o. y.o. 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 Initially referred by Dr. Octavia. Patient has hx of pseudophakia both eyes s/p PCIOL OU with Dr. Octavia and inflammation in the left eye ever since cataract surgery on 06/22/19. She has hx of HIV since 1993 and hx of Syphilis with treatment over 10 year ago. Currently on Valtrex 1g daily and Azathioprine  50 mg daily.  Today the patient reports vision is stable. Patient is tolerating and taking medications as prescribed.  She denies ocular pain, flashes, floaters and other concerns at this time.  Denies any fevers, weight loss, night sweats.   Review of Systems:   Constitutional symptoms: negative Eyes:  negative Ear, nose, throat:  negative Cardiovascular:  negative Respiratory:  negative Gastrointestinal:  negative Genitourinary:  negative Skin:  negative Neurological:  negative Musculoskeletal:  negative Psychiatric:  negative Endocrine:  negative Hematological:  negative Allergic:  negative   Selected notes from the MEDICAL RECORD NUMBER     CURRENT DROPS: See med list  Referring physician: No referring provider defined for this encounter.  REVIEW OF SYSTEMS: See tech note  MEDICATIONS Current Outpatient Medications  Medication Sig Dispense Refill  . folic acid  (FOLVITE ) 1 mg tablet Take 1 mg by mouth daily.    . acetaminophen  (TYLENOL ) 325 mg tablet Take 325 mg by mouth.    . amLODIPine  (NORVASC ) 5 mg tablet TAKE 1 TABLET BY MOUTH EVERY DAY    . aspirin 81 mg chewable tablet Take 81 mg by mouth Once Daily.    . azaTHIOprine  (IMURAN ) 50 mg tablet Take 1 tablet (50 mg total) by mouth daily. 90 tablet 3  . biotin (VB7 MAX) 100 mg/gram powd Take 500 mcg by mouth.    . calcium  carbonate (OS-CAL)  1500 mg (600 mg calcium ) tablet Take  by mouth.    . docusate sodium (COLACE) 100 mg capsule Take 100 mg by mouth.    . famotidine (PEPCID) 40 mg tablet Take 40 mg by mouth 2 (two) times a day.    . ferric citrate (AURYXIA) 210 mg iron tab tablet Take by mouth daily.    . fluticasone  propionate (FLONASE ) 50 mcg/spray nasal spray 1 spray as needed.    . fluticasone  propionate (FLONASE ) 50 mcg/spray nasal spray 1 spray.    . hydrocortisone  (ANUSOL -HC) 25 mg suppository Insert 25 mg into the rectum.    SABRA omega-3 360-1,200 mg cap Take 1,200 mg by mouth.    . polyethylene glycol (MIRALAX ) 17 gram powd powder Take  by mouth.    . rosuvastatin  (CRESTOR ) 20 mg tablet     . valACYclovir (VALTREX) 1 gram tablet Take 1 tablet (1,000 mg total) by mouth daily Indications: shingles. 90 tablet 3   No current facility-administered medications for this visit.     ALLERGIES Allergies  Allergen Reactions  . Iodinated Contrast Media Hives and Itching  . Lisinopril  Cough and Itching    PAST MEDICAL HISTORY Past Surgical History:  Procedure Laterality Date  . CATARACT EXTRACTION     Procedure: CATARACT EXTRACTION    FAMILY HISTORY Family History  Problem Relation Name Age of Onset  . Blindness Mother    . Glaucoma Mother    . Cataracts Mother    . Hypertension Mother    .  Cancer Father    . Diabetes Maternal Aunt       SOCIAL HISTORY Social History   Socioeconomic History  . Marital status: Married    Spouse name: Not on file  . Number of children: Not on file  . Years of education: Not on file  . Highest education level: Not on file  Occupational History  . Not on file  Tobacco Use  . Smoking status: Former    Current packs/day: 0.00    Types: Cigarettes    Quit date: 12/11/2003    Years since quitting: 19.6  . Smokeless tobacco: Never  Substance and Sexual Activity  . Alcohol use: Never  . Drug use: Not on file  . Sexual activity: Not on file  Other Topics Concern  . Not on  file  Social History Narrative  . Not on file   Social Determinants of Health   Food Insecurity: No Food Insecurity (02/26/2023)   Received from [facility]   Hunger Vital Sign   . Worried About Programme researcher, broadcasting/film/video in the Last Year: Never true   . Ran Out of Food in the Last Year: Never true  Transportation Needs: No Transportation Needs (02/26/2023)   Received from [facility] - Transportation   . Lack of Transportation (Medical): No   . Lack of Transportation (Non-Medical): No  Safety: Not At Risk (02/26/2023)   Received from [facility]   IPV   . Fear of Current or Ex-Partner: No   . Emotionally Abused: No   . Physically Abused: No   . Sexually Abused: No  Living Situation: Not on file       OPHTHALMIC EXAM: Base Eye Exam     Visual Acuity (Snellen - Linear)       Right Left   Dist [place] 20/40 20/40 -1   Dist ph [place] 20/25 +2 20/25 -2         Tonometry (Tonopen: Tetracaine OU, 9:09 AM)       Right Left   Pressure 13 15         Pupils       Shape APD   Right Round None   Left Round None         Visual Fields       Left Right    Full Full         Extraocular Movement       Right Left    Full Full         Neuro/Psych     Oriented x3: Yes   Mood/Affect: Normal         Dilation     Both eyes: 2.5% Phenylephrine, 1.0% Tropicamide @ 9:09 AM           Slit Lamp and Fundus Exam     External Exam       Right Left   External Normal Normal         Slit Lamp Exam       Right Left   Lids/Lashes Normal Normal   Conjunctiva/Sclera White and quiet White and quiet   Cornea 3+ SPK 1+ SPK   Anterior Chamber Deep and quiet, no cell/flare  Deep and quiet, no cell/flare   Iris Round and reactive Round and reactive   Lens PCIOL PCIOL   Anterior Vitreous Normal, no vit cell PVD, 1+ vit cell         Fundus Exam  Right Left   Disc Normal Normal   C/D Ratio 0.2 0.2   Macula Normal, flat and attached, no retinal edema  Normal, flat and attached, no retinal edema   Vessels Normal Normal   Periphery Normal Normal            LABS No visits with results within 4 Week(s) from this visit.  Latest known visit with results is:  No results found for any previous visit.     IMAGING   Imaging  Testing report: Optical coherence tomography Date obtained - 07/30/2023 Indication for imaging: Macular pucker, left eye  Technically adequate study. Patient compliance high.    Right Eye:  Central foveal thickness: 224 Findings: NFD Comparison to previous: stable    Left Eye:  Central foveal thickness: 224 Findings: mild ERM, NFD  Comparison to previous: stable    Diagnosis / Impression: ERM OS   Clinical management:  See below   Abbreviations: NFP--Normal foveal profile. Normal OCT. CME--cystoid macular edema. PED--pigment epithelial detachment. SRF--Subretinal fluid.  EZ --ellipsoid zone. ERM Epiretinal membrane.. ORT - outer retinal tubulation. SRHM - subretinal hyper-reflective material     ASSESSMENT/PLAN:   1. 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 Macular pucker, left eye  OCT, Macula - OU - Both Eyes    3. Pseudophakia of both eyes      4. Asymptomatic HIV infection (HCC)      5. High risk medication use        1. 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 I have discussed side effects and patient consents to proceed on  07/08/20 Imuran  Immunosuppression side effects: Liver failure/ toxicity, neuropathy, mild renal impairment, bone marrow suppression, secondary infections, and lymphoproliferative disorders.  - I have discussed the vision threatening nature/potentially blinding disease which requires chemotherapy/immune suppression. I have discussed in great detail the medication along with reviewing all the systemic implications.  - Labs monitored by ID  - Increased 25 mg Azathioprine  daily (started on 07/08/20) to 50 mg daily on 09/13/20 - Patient taking Azathioprine  50 mg BID instead of 50 mg daily - instructed her to take 50 mg daily on 10/10/21 - Decreased Azathioprine  50 mg daily to 25 mg daily on 04/03/22 - Patient taking Azathioprine  50 mg daily instead of 25 mg daily - ok to continue with 50 mg daily - Patient is quiet with no signs of active inflammation on ocular exam today 07/30/23 - Continue Azathioprine  50 mg daily - Continue Valtrex 1g daily (started on 04/26/20) - Follow up in 4 months for reevaluation    2. Macular pucker, left eye  - I have discussed the nature and mechanism. I have discussed that the superficial scar tissue is affecting her vision. I will recommend observation at this time.    3. Pseudophakia of both eyes  - s/p PCIOL OU with Dr. Octavia  - Monitor    4.  Asymptomatic HIV infection  - Follow up with ID, Dr. Eben  - High risk labs last drawn on 02/12/23 - Plans to obtain high risk labs 08/2023 with Dr. Eben.       =====   Josette Lot, MD Vitreoretinal Surgery Fellow Department of Ophthalmology  [facility]    Requested Prescriptions   Signed Prescriptions Disp Refills  . azaTHIOprine  (IMURAN ) 50 mg tablet 90 tablet 3    Sig: Take 1 tablet (50 mg total) by mouth daily.  . valACYclovir (VALTREX) 1 gram tablet 90 tablet 3    Sig: Take 1 tablet (1,000 mg total) by mouth daily Indications: shingles.        Return in about 4  months (around 11/29/2023).     Abbreviations DME diabetic macular edema; PDR proliferative diabetic retinopathy; NPDR non proliferative diabetic retinopathy;  BRVO  Branch retinal vein occlusion; CRVO central retinal vein occlusion; ; RD retinal detachment; RT retinal tear; SB scleral buckle; PPV pars plana vitrectomy; VH  Vitreous hemorrhage; PRP  panretinal laser photocoagulation; IVK intravitreal kenalog; IVA intravitreal avastin; IVL intravitreal Lucentis; IVE Intravitreal Eylea; PVD posterior vitreous detachment; OD right eye; OS left eye; OU  Both eyes;  VMT  vitreomacular traction; MH  Macular hole; ERM epiretinal membrane; NVD neovascularization of the disc; NVE neovascularization elsewhere; AREDS age related eye disease study  This document serves as a record of services personally performed by Josette Lot, MD. It was created on their behalf by [employer], a trained medical scribe. The creation of this record is the provider's dictation and/or activities during the visit.

## 2023-07-26 NOTE — Telephone Encounter (Signed)
Call report received and noted in [facility] dashboard.  Routed to provider for review  IMPRESSION: 1. Lung-RADS 2S, benign appearance or behavior. Continue annual screening with low-dose chest CT without contrast in 12 months. S modifier for left breast asymmetric soft tissue. 2. Possible increasing focal asymmetric soft tissue of the left breast, exam technique limits evaluation. Recommend mammography for further evaluation. 3. Coronary artery calcifications, aortic Atherosclerosis (ICD10-I70.0) and Emphysema (ICD10-J43.9).

## 2023-07-28 NOTE — Telephone Encounter (Signed)
See other telephone note from 07/26/23

## 2023-07-28 NOTE — Telephone Encounter (Signed)
Order placed for 12 month lung screening CT.

## 2023-07-29 ENCOUNTER — Other Ambulatory Visit: Payer: Self-pay | Admitting: Family Medicine

## 2023-07-29 DIAGNOSIS — Z1231 Encounter for screening mammogram for malignant neoplasm of breast: Secondary | ICD-10-CM

## 2023-08-11 ENCOUNTER — Other Ambulatory Visit: Payer: Self-pay | Admitting: Internal Medicine

## 2023-08-20 ENCOUNTER — Other Ambulatory Visit: Payer: 59

## 2023-08-22 ENCOUNTER — Ambulatory Visit
Admission: RE | Admit: 2023-08-22 | Discharge: 2023-08-22 | Disposition: A | Discharge status: Home / Self Care | Payer: 59 | Source: Ambulatory Visit | Attending: Family Medicine | Admitting: Family Medicine

## 2023-08-22 DIAGNOSIS — Z1231 Encounter for screening mammogram for malignant neoplasm of breast: Secondary | ICD-10-CM

## 2023-09-01 ENCOUNTER — Encounter: Payer: Self-pay | Admitting: Certified Registered Nurse Anesthetist

## 2023-09-03 ENCOUNTER — Encounter: Payer: 59 | Admitting: Infectious Diseases

## 2023-09-06 ENCOUNTER — Encounter: Payer: Self-pay | Admitting: Gastroenterology

## 2023-09-06 ENCOUNTER — Ambulatory Visit (AMBULATORY_SURGERY_CENTER): Payer: 59 | Admitting: Gastroenterology

## 2023-09-06 VITALS — BP 95/49 | HR 77 | Temp 97.3°F | Resp 16 | Ht 60.0 in | Wt 132.0 lb

## 2023-09-06 DIAGNOSIS — K2100 Gastro-esophageal reflux disease with esophagitis, without bleeding: Secondary | ICD-10-CM | POA: Diagnosis not present

## 2023-09-06 DIAGNOSIS — K31A0 Gastric intestinal metaplasia, unspecified: Secondary | ICD-10-CM

## 2023-09-06 DIAGNOSIS — K2950 Unspecified chronic gastritis without bleeding: Secondary | ICD-10-CM | POA: Diagnosis not present

## 2023-09-06 DIAGNOSIS — K2951 Unspecified chronic gastritis with bleeding: Secondary | ICD-10-CM | POA: Diagnosis not present

## 2023-09-06 MED ORDER — SODIUM CHLORIDE 0.9 % IV SOLN
500.0000 mL | Freq: Once | INTRAVENOUS | Status: DC
Start: 2023-09-06 — End: 2023-09-06

## 2023-09-06 MED ORDER — PANTOPRAZOLE SODIUM 40 MG PO TBEC: DELAYED_RELEASE_TABLET | ORAL | 3 refills | Status: DC

## 2023-09-06 NOTE — Progress Notes (Signed)
[facility] History and Physical   Primary Care Physician:  Sharmon Revere, MD   Reason for Procedure:  Epigastric pain, bloating, GERD  Plan:    EGD  with possible interventions as needed     HPI: Tina Olson is a very pleasant 77 y.o. female here for GD  with possible interventions as needed for evaluation of epigastric pain, bloating, GERD Denies any nausea, vomiting, abdominal pain, melena or bright red blood per rectum  The risks and benefits as well as alternatives of endoscopic procedure(s) have been discussed and reviewed. All questions answered. The patient agrees to proceed.    Past Medical History:  Diagnosis Date   Acid reflux    Anemia 2017   COPD (chronic obstructive pulmonary disease) (HCC) 03/19/2023   Difficult intravenous access    Hepatitis C 07/2014   took harvani tx    History of blood transfusion 2017   2 units   HIV infection (HCC) since 1993   Hypertension    Internal hemorrhoid    Right inguinal hernia     Past Surgical History:  Procedure Laterality Date   BREAST BIOPSY Right 07/26/2008   CATARACT EXTRACTION Right 2016   CATARACT EXTRACTION Left 06/2019   COLONOSCOPY WITH PROPOFOL N/A 06/11/2017   Procedure: COLONOSCOPY WITH PROPOFOL;  Surgeon: Napoleon Form, MD;  Location: [facility];  Service: Endoscopy;  Laterality: N/A;  PT IS KNOWN HEART STICK. WILL PROBABLY REQUIRE ULTRASOUND OR ANESTHESIA FOR IV STICK.   ESOPHAGOGASTRODUODENOSCOPY (EGD) WITH PROPOFOL N/A 06/11/2017   Procedure: ESOPHAGOGASTRODUODENOSCOPY (EGD) WITH PROPOFOL;  Surgeon: Napoleon Form, MD;  Location: [facility];  Service: Endoscopy;  Laterality: N/A;    Prior to Admission medications   Medication Sig Start Date End Date Taking? Authorizing Provider  amLODipine (NORVASC) 10 MG tablet Take 10 mg by mouth daily. 04/13/21  Yes [provider]  Ascorbic Acid (VITAMIN C PO) Take 1 tablet by mouth daily.   Yes [provider]   aspirin 81 MG chewable tablet Chew 1 tablet by mouth daily.   Yes [provider]  azaTHIOprine (IMURAN) 50 MG tablet Take 25 mg by mouth daily. 07/08/20  Yes [provider]  BIOTIN PO Take 500 mcg by mouth daily.   Yes [provider]  calcium carbonate (OSCAL) 1500 (600 Ca) MG TABS tablet Take 1,500 mg by mouth daily with breakfast.   Yes [provider]  Cyanocobalamin (VITAMIN B12 PO) Take 1 tablet by mouth daily.   Yes [provider]  famotidine (PEPCID) 40 MG tablet Take 40 mg by mouth 2 (two) times daily. 06/10/23  Yes [provider]  Misc Natural Products (NEURIVA PO) Take 1 capsule by mouth daily.   Yes [provider]  omeprazole (PRILOSEC) 40 MG capsule Take 1 capsule (40 mg total) by mouth daily. 07/11/23  Yes Tobey Schmelzle, Eleonore Chiquito, MD  OXYGEN Inhale 1.5 L into the lungs at bedtime.   Yes [provider]  rosuvastatin (CRESTOR) 20 MG tablet Take 20 mg by mouth in the morning. 05/21/22  Yes [provider]  SPIRIVA RESPIMAT 1.25 MCG/ACT AERS USE 2 INHALATIONS BY MOUTH DAILY 08/14/23  Yes Kalman Shan, MD  valACYclovir (VALTREX) 1000 MG tablet Take 1,000 mg by mouth daily.  12/22/19  Yes [provider]  Zinc 50 MG TABS Take 50 mg by mouth daily.   Yes [provider]  acetaminophen (TYLENOL) 325 MG tablet Take 325 mg by mouth daily as needed for moderate pain  or headache.    [provider]  docusate sodium (COLACE) 100 MG capsule Take 100 mg by mouth as needed for mild constipation.    [provider]  ferrous sulfate 325 (65 FE) MG tablet Take 1 tablet (325 mg total) by mouth daily. 11/07/22 01/01/23  Lanae Boast, MD  hydrocortisone (ANUSOL-HC) 25 MG suppository Place 25 mg rectally daily as needed. 03/28/23   [provider]  polyethylene glycol (MIRALAX / GLYCOLAX) packet Take 17 g by mouth every other day.    [provider]    Current Outpatient  Medications  Medication Sig Dispense Refill   amLODipine (NORVASC) 10 MG tablet Take 10 mg by mouth daily.     Ascorbic Acid (VITAMIN C PO) Take 1 tablet by mouth daily.     aspirin 81 MG chewable tablet Chew 1 tablet by mouth daily.     azaTHIOprine (IMURAN) 50 MG tablet Take 25 mg by mouth daily.     BIOTIN PO Take 500 mcg by mouth daily.     calcium carbonate (OSCAL) 1500 (600 Ca) MG TABS tablet Take 1,500 mg by mouth daily with breakfast.     Cyanocobalamin (VITAMIN B12 PO) Take 1 tablet by mouth daily.     famotidine (PEPCID) 40 MG tablet Take 40 mg by mouth 2 (two) times daily.     Misc Natural Products (NEURIVA PO) Take 1 capsule by mouth daily.     omeprazole (PRILOSEC) 40 MG capsule Take 1 capsule (40 mg total) by mouth daily. 90 capsule 3   OXYGEN Inhale 1.5 L into the lungs at bedtime.     rosuvastatin (CRESTOR) 20 MG tablet Take 20 mg by mouth in the morning.     SPIRIVA RESPIMAT 1.25 MCG/ACT AERS USE 2 INHALATIONS BY MOUTH DAILY 12 g 3   valACYclovir (VALTREX) 1000 MG tablet Take 1,000 mg by mouth daily.      Zinc 50 MG TABS Take 50 mg by mouth daily.     acetaminophen (TYLENOL) 325 MG tablet Take 325 mg by mouth daily as needed for moderate pain or headache.     docusate sodium (COLACE) 100 MG capsule Take 100 mg by mouth as needed for mild constipation.     ferrous sulfate 325 (65 FE) MG tablet Take 1 tablet (325 mg total) by mouth daily. 30 tablet 0   hydrocortisone (ANUSOL-HC) 25 MG suppository Place 25 mg rectally daily as needed.     polyethylene glycol (MIRALAX / GLYCOLAX) packet Take 17 g by mouth every other day.     Current Facility-Administered Medications  Medication Dose Route Frequency Provider Last Rate Last Admin   0.9 %  sodium chloride infusion  500 mL Intravenous Once Napoleon Form, MD        Allergies as of 09/06/2023 - Review Complete 09/06/2023  Allergen Reaction Noted   Iodinated contrast media Hives and Itching 07/23/2022   Lisinopril Itching  and Cough 02/25/2012   Other Hives and Itching 07/07/2012    Family History  Problem Relation Age of Onset   Alzheimer's disease Mother    Glaucoma Mother    Hypertension Mother    Cancer Father        ? type   Seizures Son    Stomach cancer Neg Hx    Rectal cancer Neg Hx    Colon cancer Neg Hx    Esophageal cancer Neg Hx     Social History   Socioeconomic History   Marital status: Widowed  Spouse name: Not on file   Number of children: 1   Years of education: Not on file   Highest education level: Not on file  Occupational History   Not on file  Tobacco Use   Smoking status: Former    Current packs/day: 0.00    Average packs/day: 0.8 packs/day for 45.0 years (33.8 ttl pk-yrs)    Types: Cigarettes    Start date: 17    Quit date: 2010    Years since quitting: 14.7    Passive exposure: Past   Smokeless tobacco: Never  Vaping Use   Vaping status: Never Used  Substance and Sexual Activity   Alcohol use: No    Alcohol/week: 0.0 standard drinks of alcohol   Drug use: No    Types: IV    Comment: clean since around 2000   Sexual activity: Not Currently    Comment: pt. declined condoms  Other Topics Concern   Not on file  Social History Narrative   Not on file   Social Determinants of Health   Financial Resource Strain: Low Risk  (02/26/2023)   Overall Financial Resource Strain (CARDIA)    Difficulty of Paying Living Expenses: Not hard at all  Food Insecurity: Not on file (08/19/2023)  Transportation Needs: No Transportation Needs (02/26/2023)   PRAPARE - Administrator, Civil Service (Medical): No    Lack of Transportation (Non-Medical): No  Physical Activity: Inactive (02/26/2023)   Exercise Vital Sign    Days of Exercise per Week: 0 days    Minutes of Exercise per Session: 0 min  Stress: No Stress Concern Present (02/26/2023)   [employer] of Occupational Health - Occupational Stress Questionnaire    Feeling of Stress : Not at all  Social  Connections: Moderately Integrated (02/26/2023)   Social Connection and Isolation Panel [NHANES]    Frequency of Communication with Friends and Family: More than three times a week    Frequency of Social Gatherings with Friends and Family: More than three times a week    Attends Religious Services: More than 4 times per year    Active Member of [employer] or Organizations: Yes    Attends Banker Meetings: More than 4 times per year    Marital Status: Widowed  Intimate Partner Violence: Not At Risk (02/26/2023)   Humiliation, Afraid, Rape, and Kick questionnaire    Fear of Current or Ex-Partner: No    Emotionally Abused: No    Physically Abused: No    Sexually Abused: No    Review of Systems:  All other review of systems negative except as mentioned in the HPI.  Physical Exam: Vital signs in last 24 hours: BP 130/71   Pulse 62   Temp (!) 97.3 F (36.3 C)   Ht 5' (1.524 m)   Wt 132 lb (59.9 kg)   SpO2 96%   BMI 25.78 kg/m  General:   Alert, NAD Lungs:  Clear .   Heart:  Regular rate and rhythm Abdomen:  Soft, nontender and nondistended. Neuro/Psych:  Alert and cooperative. Normal mood and affect. A and O x 3  Reviewed labs, radiology imaging, old records and pertinent past GI work up  Patient is appropriate for planned procedure(s) and anesthesia in an ambulatory setting   K. Scherry Ran , MD [phone]

## 2023-09-06 NOTE — Progress Notes (Signed)
1007  Pt experienced laryngeal spasm with jaw thrust and PPV performed. vss

## 2023-09-06 NOTE — Progress Notes (Signed)
1002 Robinul 0.1 mg IV given due large amount of secretions upon assessment.  MD made aware, vss 

## 2023-09-06 NOTE — Progress Notes (Signed)
Report given to PACU, vss 

## 2023-09-06 NOTE — Patient Instructions (Addendum)
-   Resume previous diet. - Continue present medications. - Await pathology results. - Follow an antireflux regimen. - Use Protonix (pantoprazole) 40 mg PO BID for 3 months and then decrease to once daily. - Stop taking omeprazole  YOU HAD AN ENDOSCOPIC PROCEDURE TODAY AT THE [facility]:   Refer to the procedure report that was given to you for any specific questions about what was found during the examination.  If the procedure report does not answer your questions, please call your gastroenterologist to clarify.  If you requested that your care partner not be given the details of your procedure findings, then the procedure report has been included in a sealed envelope for you to review at your convenience later.  YOU SHOULD EXPECT: Some feelings of bloating in the abdomen. Passage of more gas than usual.  Walking can help get rid of the air that was put into your GI tract during the procedure and reduce the bloating. If you had a lower endoscopy (such as a colonoscopy or flexible sigmoidoscopy) you may notice spotting of blood in your stool or on the toilet paper. If you underwent a bowel prep for your procedure, you may not have a normal bowel movement for a few days.  Please Note:  You might notice some irritation and congestion in your nose or some drainage.  This is from the oxygen used during your procedure.  There is no need for concern and it should clear up in a day or so.  SYMPTOMS TO REPORT IMMEDIATELY: Following upper endoscopy (EGD)  Vomiting of blood or coffee ground material  New chest pain or pain under the shoulder blades  Painful or persistently difficult swallowing  New shortness of breath  Fever of 100F or higher  Black, tarry-looking stools  For urgent or emergent issues, a gastroenterologist can be reached at any hour by calling [phone]. Do not use MyChart messaging for urgent concerns.    DIET:  We do recommend a small meal at first, but then you  may proceed to your regular diet.  Drink plenty of fluids but you should avoid alcoholic beverages for 24 hours.  ACTIVITY:  You should plan to take it easy for the rest of today and you should NOT DRIVE or use heavy machinery until tomorrow (because of the sedation medicines used during the test).    FOLLOW UP: Our staff will call the number listed on your records the next business day following your procedure.  We will call around 7:15- 8:00 am to check on you and address any questions or concerns that you may have regarding the information given to you following your procedure. If we do not reach you, we will leave a message.     If any biopsies were taken you will be contacted by phone or by letter within the next 1-3 weeks.  Please call us at [phone] if you have not heard about the biopsies in 3 weeks.    SIGNATURES/CONFIDENTIALITY: You and/or your care partner have signed paperwork which will be entered into your electronic medical record.  These signatures attest to the fact that that the information above on your After Visit Summary has been reviewed and is understood.  Full responsibility of the confidentiality of this discharge information lies with you and/or your care-partner.

## 2023-09-06 NOTE — Progress Notes (Signed)
Called to room to assist during endoscopic procedure.  Patient ID and intended procedure confirmed with present staff. Received instructions for my participation in the procedure from the performing physician.  

## 2023-09-06 NOTE — Op Note (Signed)
[facility] Patient Name: Tina Olson Procedure Date: 09/06/2023 9:56 AM MRN: [id number] Endoscopist: Napoleon Form , MD, [id number] Age: 77 Referring MD:  Date of Birth: Jan 03, 1946 Gender: Female Account #: [id number] Procedure:                Upper GI endoscopy Indications:              Epigastric abdominal pain, Esophageal reflux Medicines:                Monitored Anesthesia Care Procedure:                Pre-Anesthesia Assessment:                           - Prior to the procedure, a History and Physical                            was performed, and patient medications and                            allergies were reviewed. The patient's tolerance of                            previous anesthesia was also reviewed. The risks                            and benefits of the procedure and the sedation                            options and risks were discussed with the patient.                            All questions were answered, and informed consent                            was obtained. Prior Anticoagulants: The patient has                            taken no anticoagulant or antiplatelet agents. ASA                            Grade Assessment: III - A patient with severe                            systemic disease. After reviewing the risks and                            benefits, the patient was deemed in satisfactory                            condition to undergo the procedure.                           After obtaining informed consent, the endoscope was  passed under direct vision. Throughout the                            procedure, the patient's blood pressure, pulse, and                            oxygen saturations were monitored continuously. The                            GIF [id number] #[id number] was introduced through the                            mouth, and advanced to the second part of duodenum.                            The  upper GI endoscopy was accomplished without                            difficulty. The patient tolerated the procedure                            well. Scope In: Scope Out: Findings:                 The Z-line was regular and was found 36 cm from the                            incisors.                           No gross lesions were noted in the entire esophagus.                           A 5 cm hiatal hernia was present.                           Localized nodular mucosa with superficial                            ulceration was found in the gastric antrum.                            Biopsies were taken with a cold forceps for                            histology.                           Patchy mild inflammation characterized by                            congestion (edema) and erythema was found in the                            entire examined stomach. Biopsies were taken with a  cold forceps for Helicobacter pylori testing.                           The cardia and gastric fundus were normal on                            retroflexion.                           The examined duodenum was normal. Complications:            No immediate complications. Estimated Blood Loss:     Estimated blood loss was minimal. Impression:               - Z-line regular, 36 cm from the incisors.                           - No gross lesions in the entire esophagus.                           - 5 cm hiatal hernia.                           - Hemorrhagic gastropathy. Biopsied.                           - Gastritis. Biopsied.                           - Normal examined duodenum. Recommendation:           - Patient has a contact number available for                            emergencies. The signs and symptoms of potential                            delayed complications were discussed with the                            patient. Return to normal activities tomorrow.                             Written discharge instructions were provided to the                            patient.                           - Resume previous diet.                           - Continue present medications.                           - Await pathology results.                           -  Follow an antireflux regimen.                           - Use Protonix (pantoprazole) 40 mg PO BID for 3                            months and then decrease to once daily. Napoleon Form, MD 09/06/2023 10:27:09 AM This report has been signed electronically.

## 2023-09-09 ENCOUNTER — Telehealth: Payer: Self-pay

## 2023-09-09 NOTE — Telephone Encounter (Signed)
Attempted to reach patient for post-procedure f/u call. No answer. Left message for her to please not hesitate to call if she has any questions/concerns regarding her care. 

## 2023-09-10 ENCOUNTER — Other Ambulatory Visit (HOSPITAL_COMMUNITY)
Admission: RE | Admit: 2023-09-10 | Discharge: 2023-09-10 | Disposition: A | Discharge status: Home / Self Care | Payer: 59 | Source: Ambulatory Visit | Attending: Internal Medicine | Admitting: Internal Medicine

## 2023-09-10 ENCOUNTER — Other Ambulatory Visit (INDEPENDENT_AMBULATORY_CARE_PROVIDER_SITE_OTHER): Payer: 59

## 2023-09-10 DIAGNOSIS — Z113 Encounter for screening for infections with a predominantly sexual mode of transmission: Secondary | ICD-10-CM

## 2023-09-10 DIAGNOSIS — B20 Human immunodeficiency virus [HIV] disease: Secondary | ICD-10-CM

## 2023-09-10 DIAGNOSIS — E782 Mixed hyperlipidemia: Secondary | ICD-10-CM

## 2023-09-11 LAB — COMPREHENSIVE METABOLIC PANEL
ALT: 20 [IU]/L (ref 0–32)
AST: 31 [IU]/L (ref 0–40)
Albumin: 4.1 g/dL (ref 3.8–4.8)
Alkaline Phosphatase: 60 [IU]/L (ref 44–121)
BUN/Creatinine Ratio: 13 (ref 12–28)
BUN: 12 mg/dL (ref 8–27)
Bilirubin Total: 0.5 mg/dL (ref 0.0–1.2)
CO2: 18 mmol/L — ABNORMAL LOW (ref 20–29)
Calcium: 10.3 mg/dL (ref 8.7–10.3)
Chloride: 104 mmol/L (ref 96–106)
Creatinine, Ser: 0.94 mg/dL (ref 0.57–1.00)
Globulin, Total: 4.1 g/dL (ref 1.5–4.5)
Glucose: 101 mg/dL — ABNORMAL HIGH (ref 70–99)
Potassium: 4.1 mmol/L (ref 3.5–5.2)
Sodium: 138 mmol/L (ref 134–144)
Total Protein: 8.2 g/dL (ref 6.0–8.5)
eGFR: 63 mL/min/{1.73_m2} (ref >59)

## 2023-09-11 LAB — RPR: RPR Ser Ql: NONREACTIVE

## 2023-09-11 LAB — CBC
Hematocrit: 35 % (ref 34.0–46.6)
Hemoglobin: 11.6 g/dL (ref 11.1–15.9)
MCH: 30.9 pg (ref 26.6–33.0)
MCHC: 33.1 g/dL (ref 31.5–35.7)
MCV: 93 fL (ref 79–97)
Platelets: 181 10*3/uL (ref 150–450)
RBC: 3.75 x10E6/uL — ABNORMAL LOW (ref 3.77–5.28)
RDW: 14.1 % (ref 11.7–15.4)
WBC: 2.3 10*3/uL — CL (ref 3.4–10.8)

## 2023-09-11 LAB — HIV-1 RNA QUANT-NO REFLEX-BLD
HIV-1 RNA Viral Load Log: 1.845 {Log}
HIV-1 RNA Viral Load: 70 {copies}/mL

## 2023-09-11 LAB — LIPID PANEL
Chol/HDL Ratio: 2.3 {ratio} (ref 0.0–4.4)
Cholesterol, Total: 128 mg/dL (ref 100–199)
HDL: 55 mg/dL (ref >39)
LDL Chol Calc (NIH): 59 mg/dL (ref 0–99)
Triglycerides: 70 mg/dL (ref 0–149)
VLDL Cholesterol Cal: 14 mg/dL (ref 5–40)

## 2023-09-11 LAB — URINE CYTOLOGY ANCILLARY ONLY
Chlamydia: NEGATIVE
Comment: NEGATIVE
Comment: NORMAL
Neisseria Gonorrhea: NEGATIVE

## 2023-09-11 LAB — T-HELPER CELL (CD4) - (RCID CLINIC ONLY)
CD4 % Helper T Cell: 34 % (ref 33–65)
CD4 T Cell Abs: 237 /uL — ABNORMAL LOW (ref 400–1790)

## 2023-09-11 LAB — SURGICAL PATHOLOGY

## 2023-09-17 ENCOUNTER — Telehealth: Payer: Self-pay | Admitting: Gastroenterology

## 2023-09-17 NOTE — Telephone Encounter (Signed)
Inbound call from patient stating that she got a letter in the mail from the pharmacy stating Dr. Lavon Paganini needed to contact them regarding her Protonix prescription. Patient read a little of the letter, I'm not sure if she is needing a PA? Please advise.

## 2023-09-17 NOTE — Telephone Encounter (Signed)
This seems like a prior authorization request from a protonix prescription that was sent 9/27 after her Endoscopy in [facility]. It was not sent by me, But I will forward to the rx team    Rx team, Has anyone seen a prior auth request on this patient for pantoprazole? Thanks

## 2023-09-18 NOTE — Telephone Encounter (Signed)
Inbound call from [facility] requesting for protonix Prior authorization to be initiated.

## 2023-09-20 NOTE — Telephone Encounter (Signed)
Inbound call from [employer] to follow up on Prior Authorization.

## 2023-09-23 ENCOUNTER — Other Ambulatory Visit: Payer: Self-pay | Admitting: *Deleted

## 2023-09-23 MED ORDER — PANTOPRAZOLE SODIUM 40 MG PO TBEC: 40.0000 mg | DELAYED_RELEASE_TABLET | Freq: Two times a day (BID) | ORAL | 3 refills | Status: DC

## 2023-09-23 NOTE — Telephone Encounter (Signed)
Inbound call from patient, is wishing to speak to a nurse to see if she can take the generic form of the medication. She states [employer] called and stated if it was generic they could approve it. Please advise.

## 2023-09-23 NOTE — Progress Notes (Signed)
Sent a second time, sent as generic only to [facility]

## 2023-09-23 NOTE — Telephone Encounter (Signed)
Generic was sent, Did the Rx team ever receive a request to do this prior authorization?  Rx Team please advise

## 2023-09-24 ENCOUNTER — Encounter: Payer: Self-pay | Admitting: Infectious Diseases

## 2023-09-24 ENCOUNTER — Ambulatory Visit: Payer: 59 | Admitting: Infectious Diseases

## 2023-09-24 ENCOUNTER — Other Ambulatory Visit (HOSPITAL_COMMUNITY): Payer: Self-pay

## 2023-09-24 VITALS — BP 139/67 | HR 57 | Temp 97.7°F | Ht 60.0 in | Wt 131.2 lb

## 2023-09-24 DIAGNOSIS — Z23 Encounter for immunization: Secondary | ICD-10-CM

## 2023-09-24 DIAGNOSIS — I10 Essential (primary) hypertension: Secondary | ICD-10-CM | POA: Diagnosis not present

## 2023-09-24 DIAGNOSIS — H30032 Focal chorioretinal inflammation, peripheral, left eye: Secondary | ICD-10-CM

## 2023-09-24 DIAGNOSIS — Z21 Asymptomatic human immunodeficiency virus [HIV] infection status: Secondary | ICD-10-CM

## 2023-09-24 DIAGNOSIS — B20 Human immunodeficiency virus [HIV] disease: Secondary | ICD-10-CM | POA: Diagnosis not present

## 2023-09-24 DIAGNOSIS — K5900 Constipation, unspecified: Secondary | ICD-10-CM

## 2023-09-24 DIAGNOSIS — J849 Interstitial pulmonary disease, unspecified: Secondary | ICD-10-CM

## 2023-09-24 DIAGNOSIS — Z113 Encounter for screening for infections with a predominantly sexual mode of transmission: Secondary | ICD-10-CM

## 2023-09-24 DIAGNOSIS — Z79899 Other long term (current) drug therapy: Secondary | ICD-10-CM

## 2023-09-24 NOTE — Assessment & Plan Note (Signed)
Will forward notes to her ophtho.

## 2023-09-24 NOTE — Assessment & Plan Note (Signed)
Appreciate pulm f/u.  She appears to have made some improvement.

## 2023-09-24 NOTE — Assessment & Plan Note (Signed)
She is doing well Encouraged her to get new COVID shot Defer PCV 20/15 til 2026.  She is off meds and would consider starting ART if her CD4 drops any further.  Will see her back in 6 months due to CD4 trend.

## 2023-09-24 NOTE — Progress Notes (Signed)
Subjective:    Patient ID: Tina Olson, female  DOB: July 01, 1946, 77 y.o.        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 Was started on harvoni in July 2015 for her Hep C (VL 6.5 million) and F0 elastography/ultrasound. She was not able to tolerate this (thinks she took ~ 1/2 of the rx, didn't get last bottle). Her repeat Hep C RNA were negative (10-04-14) (06-27-15)   She has had 2 prev colonoscopies. (-).  She has seen optho for pseudophakia, cataracts, focal chorioretinal inflammation. She feels like her vision is good, she takes rx (imuran, valtrex) for this.     No ART.  She is on prn 1.5 L home O2 at night for her ILD/COPD  (which she is followed by rheum, pulmonary for). States she uses very little.  She has f/u sched with pulm. She has repeat CT scan pending as well.     Prev breast bx 2009- calcifications.  Mammo 08-2023; (-) Birads 1.    last COVID 04-2021. Got flu shot today. Has had RSV vaccine.   EGD 9-27 showed chronic active gastritits and H pylori stains (-).   HIV 1 RNA Quant  Date Value  02/15/2022 31 Copies/mL (H)  05/18/2021 118 copies/mL (H)  11/24/2020 93 Copies/mL (H)   HIV-1 RNA Viral Load (copies/mL)  Date Value  09/10/2023 70  02/12/2023 130  11/06/2022 <20   CD4 T Cell Abs (/uL)  Date Value  09/10/2023 237 (L)  02/12/2023 350 (L)  11/06/2022 313 (L)     Health Maintenance  Topic Date Due  . DTaP/Tdap/Td (1 - Tdap) Never done  . Zoster Vaccines- Shingrix (1 of 2) Never done  . COVID-19 Vaccine (4 - 2023-24 season) 08/11/2023  . Medicare Annual Wellness (AWV)  02/26/2024  . Lung Cancer Screening  07/21/2024  . Colonoscopy  06/12/2027  . Pneumonia Vaccine 27+ Years old  Completed  . INFLUENZA VACCINE  Completed  . DEXA SCAN  Completed  . Hepatitis C Screening  Completed  . HPV VACCINES  Aged Out      Review of Systems  Constitutional:  Negative for chills, fever and weight loss.   Respiratory:  Positive for shortness of breath. Negative for cough.   Gastrointestinal:  Positive for constipation (taking miralax). Negative for diarrhea.  Genitourinary:  Negative for dysuria.   Please see HPI. All other systems reviewed and negative.     Objective:  Physical Exam Vitals reviewed.  Constitutional:      Appearance: Normal appearance.  HENT:     Mouth/Throat:     Mouth: Mucous membranes are moist.     Pharynx: No oropharyngeal exudate.  Eyes:     Extraocular Movements: Extraocular movements intact.     Pupils: Pupils are equal, round, and reactive to light.  Cardiovascular:     Rate and Rhythm: Normal rate and regular rhythm.  Pulmonary:     Effort: Pulmonary effort is normal.     Breath sounds: Normal breath sounds.  Abdominal:     General: Bowel sounds are normal. There is no distension.     Palpations: Abdomen is soft.     Tenderness: There is no abdominal tenderness.  Musculoskeletal:        General: Normal range of motion.     Cervical back: Normal range of motion and neck supple.     Right lower leg: No edema.  Left lower leg: No edema.  Neurological:     General: No focal deficit present.     Mental Status: She is alert.  Psychiatric:        Mood and Affect: Mood normal.          Assessment & Plan:

## 2023-09-24 NOTE — Telephone Encounter (Signed)
PA was not needed for this medication according to test billing with [facility]. Medication was filled at patient pharmacy on 10.14.24.

## 2023-09-24 NOTE — Assessment & Plan Note (Signed)
Well controlled, continue to monitor

## 2023-09-24 NOTE — Assessment & Plan Note (Signed)
Continue prn miralax

## 2023-09-26 ENCOUNTER — Encounter: Payer: Self-pay | Admitting: Gastroenterology

## 2023-10-02 ENCOUNTER — Ambulatory Visit
Admission: RE | Admit: 2023-10-02 | Discharge: 2023-10-02 | Disposition: A | Discharge status: Home / Self Care | Payer: 59 | Source: Ambulatory Visit | Attending: Internal Medicine | Admitting: Internal Medicine

## 2023-10-02 DIAGNOSIS — J439 Emphysema, unspecified: Secondary | ICD-10-CM

## 2023-10-07 ENCOUNTER — Encounter: Payer: Self-pay | Admitting: Internal Medicine

## 2023-10-07 ENCOUNTER — Ambulatory Visit: Payer: 59 | Admitting: Internal Medicine

## 2023-10-07 ENCOUNTER — Ambulatory Visit (INDEPENDENT_AMBULATORY_CARE_PROVIDER_SITE_OTHER): Payer: 59 | Admitting: Internal Medicine

## 2023-10-07 VITALS — BP 118/52 | HR 58 | Ht 60.0 in | Wt 130.0 lb

## 2023-10-07 DIAGNOSIS — J439 Emphysema, unspecified: Secondary | ICD-10-CM

## 2023-10-07 LAB — PULMONARY FUNCTION TEST
DL/VA % pred: 58 %
DL/VA: 2.48 ml/min/mmHg/L
DLCO cor % pred: 54 %
DLCO cor: 9.07 ml/min/mmHg
DLCO unc % pred: 51 %
DLCO unc: 8.53 ml/min/mmHg
FEF 25-75 Pre: 1.82 L/s
FEF2575-%Pred-Pre: 126 %
FEV1-%Pred-Pre: 106 %
FEV1-Pre: 1.85 L
FEV1FVC-%Pred-Pre: 107 %
FEV6-%Pred-Pre: 104 %
FEV6-Pre: 2.31 L
FEV6FVC-%Pred-Pre: 105 %
FVC-%Pred-Pre: 98 %
FVC-Pre: 2.31 L
Pre FEV1/FVC ratio: 80 %
Pre FEV6/FVC Ratio: 100 %

## 2023-10-07 NOTE — Progress Notes (Signed)
Spirometry/DLCO performed today. 

## 2023-10-07 NOTE — Progress Notes (Signed)
This Provider for this visit: Treatment Team:  Attending Provider: Kalman Shan, MD    03/19/2023 -   Chief Complaint  Patient presents with   Follow-up    Doing well.  Spiriva helping with SOB.  Needs refill on Spiriva    HPI Tina Olson 77 y.o. -returns for follow-up.  From a shortness of breath perspective she is stable.  She says Virgel Bouquet is actually helping her.  In fact symptom scores have improved compared to October 2023.  She says she is doing well.  Review of the records indicate January 2024 she saw Dr. Sheliah H[place] rheumatology who is reassured her about the absence of systemic connective tissue disease.  He felt that the autoimmune antibodies could be false positive potentially.  She is also seen Dr. Johny Sax of infectious diseases 02/26/2023 CD4 counts were normal upon my review.  Mid March 2024 she also saw Dr. Sherryll Burger her ophthalmologist and has been reassured she did indicate that she is getting some bladder scan and abdominal imaging coming up.  But otherwise she is well.  Today on exam I did hear right greater than left lower lobe crackles.  But her  symptom score is better and her walking desaturation test is stable./Normal.  Of note she uses her night oxygen.  She wants to know if she can go to [place] for a few days without portable oxygen on night oxygen.  I reassured her that she could do that.  She also thinks that at 8 PM she needs to put her night oxygen on.  I did indicate to her she only needs to do it when she is going to bed and she is allowed to go to church etc. without oxygen.  OV 10/07/2023  Subjective:  Patient ID: Tina Olson, female , DOB: Jan 18, 1946 , age 52 y.o. , MRN: [id number] , ADDRESS: [address] 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  - Macular pucker oof left retinae  - hx of pseudophakia both eyes s/p PCIOL OU with Dr. Dione Booze and inflammation in the left eye ever since cataract surgery on 06/22/19.  - Currently on Valtrex 1g daily and Azathioprine 50 mg daily.   She has hx of HIV since 1993 andCD4 count 388 02/15/2022   hx of Syphilis with treatment over 10 year ago.   Has HIV Hx of HIV since 1993 and hx of Syphilis over 10 years ago  -sees Dr Ninetta Lights  -CD4 353/5/24  She is a former smoker > 30 ppd -  who undergoes lung cancer screening as well  Zio patch x2 weeks 05/04/2022 showed 37 episodes of SVT, longest lasting 17 beats with average rate 118 bpm, occasional PACs (1.4% of beats).  Echocardiogram 05/09/2022 showed EF 55 to 60%, mild LVH, normal diastolic function, normal RV function, no significant valvular disease  -Dr. Jacelyn Pi  last visit July 2023   GERD:   -Unclear treatment  Concern for ILD but ILD ruled out October 2023  - interstitial lung disease mentioned on CT scans in 2020 but she has  PFT     Latest Ref Rng & Units 10/07/2023    2:55 PM 10/09/2022    8:35 AM  ILD indicators  FVC-Pre L 2.31  P 2.25   FVC-Predicted Pre % 98  P 94   FVC-Post L  2.46   FVC-Predicted Post %  103   TLC L  5.32   TLC Predicted %  119   DLCO uncorrected ml/min/mmHg 8.53  P 8.44   DLCO UNC %Pred % 51  P 50   DLCO Corrected ml/min/mmHg 9.07  P 8.44   DLCO COR %Pred % 54  P 50     P Preliminary result      LAB RESULTS last 96 hours No results found.  LAB RESULTS last 90 days Recent Results (from the past 2160 hour(s))  Surgical pathology ([facility])     Status: None   Collection Time: 09/06/23 12:00 AM  Result Value Ref Range   SURGICAL PATHOLOGY      SURGICAL PATHOLOGY [facility] [address], [address] [place], [place] [address] Telephone [phone] or [phone] Fax [phone]  REPORT OF SURGICAL PATHOLOGY   Accession #: [id number] Patient Name: Tina Olson, Tina Olson Visit # : [id number]  MRN: [id number] Physician: Napoleon Form DOB/Age [birth year] (Age: 41) Gender: F Collected Date: 09/06/2023 Received Date: 09/09/2023  FINAL DIAGNOSIS       1. Surgical [P], gastric antrum bx :       -  ANTRAL AND OXYNTIC MUCOSA WITH FEATURES OF BOTH MODERATE CHRONIC INACTIVE      GASTRITIS AND CHEMICAL/REACTIVE CHANGE.      -  AN IMMUNOHISTOCHEMICAL STAIN FOR HELICOBACTER PYLORI ORGANISMS IS NEGATIVE.       2. Surgical [P], random gastric bx :       -  ANTRAL/OXYNTIC MUCOSA WITH MODERATE CHRONIC INACTIVE GASTRITIS AND FOCAL      INTESTINAL METAPLASIA SUGGESTIVE OF ATROPHY.      -  AN IMMUNOHISTOCHEMICAL STAIN FOR HELICOBACTER PYLORI ORGANISMS IS NEGATIVE.       ELECTRONIC SIGNATURE :  Francene Castle, Mark, Pathologist, Electronic Signature  MICROSCOPIC DESCRIPTION  CASE COMMENTS STAINS USED IN DIAGNOSIS: H&E Universal Negative Control-DAB H&E Stains used in diagnosis 1 H. Pylori IHC, 2 H. Pylori IHC Some of these immunohistochemical stains may have been developed and the performance characteristics determined by [facility]  Some may not have been cleared or approved by the U.S. Food and Drug Administration.  The FDA has determined that such clearance or approval is not necessary.  This test is used for clinical purposes.  It should not be regarded as investigational or for research.  This laboratory is certified under the Clinical Laboratory Improvement Amendments of 1988 (CLIA-88) as qualified to perform high complexity clinical laboratory testing.    CLINICAL HISTORY  SPECIMEN(S) OBTAINED 1. Surgical [P], Gastric Antrum Bx 2. Surgical [P], Random Gastric Bx  SPECIMEN COMMENTS: 1. Gastroesophageal reflux disease with esophagi tis, unspecified whether hemorrhage; antral gastritis SPECIMEN CLINICAL INFORMATION: 1. R/O dysplasia and cancer 2. R/O H.pylori    Gross Description 1. Received in formalin are tan, soft tissue fragments that are submitted in toto.Number: multiple, Size:  0.2 cm smallest to 0.4 cm largest, (1B) ( TA ) 2. Received in formalin are tan, soft tissue fragments that are submitted in toto.Number: 3, Size: 0.3 cm smallest to 0.4 cm largest, (1B) ( TA )        Report signed out from the following location(s) [facility]  OV 08/16/2022  Subjective:  Patient ID: Tina Olson, female , DOB: 1946/03/17 , age 9 y.o. , MRN: [id number] , ADDRESS: [address] [place] [address] PCP Sharmon Revere, MD Patient Care Team: Sharmon Revere, MD as PCP - General (Family Medicine) [name], MD as PCP - Infectious Diseases (Infectious Diseases)  This Provider for this visit: Treatment Team:  Attending Provider: Kalman Shan, MD    08/16/2022 -   Chief Complaint  Patient presents with   Consult    Pt is here due to recent CT coronary results.  Pt states that she does have some SOB especially if she goes to the mailbox to bend down to pick something up.     HPI  [facility] Comprehensive ILD Questionnaire Darcella Weyland 77 y.o. -77 y.o. 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 at. Patient has hx of pseudophakia both eyes s/p PCIOL OU with Dr. Dione Booze and inflammation in the left eye ever since cataract surgery on 06/22/19. She has hx of HIV since 1993 and hx of Syphilis with treatment over 10 year ago. Currently on Valtrex 1g daily and Azathioprine 50 mg daily. Sees Dr Clovis Pu t Wake   Has HIV Hx of HIV since 1993 and hx of Syphilis over 10 years ago  -sees Dr Ninetta Lights  She is a former smoker who undergoes lung cancer screening as well  She actually has interstitial lung disease mentioned on CT scans in 2020 but she has not been aware of this until she had a cardiac CT in 2023 August.  Therefore she has been referred here.  She tells me that she has had insidious onset of shortness of breath for at least a year and slowly getting worse.  Made worse with exertion and stooping forward.  Improved by rest but there is also variability to it and sometimes it is not there.        Past Medical History :  Is HIV positive and follows with Dr. Ninetta Lights Has ophthalmologic issues and is immunosuppressed and is on Imuran for at  least a few years.   Positive for longstanding history of acid reflux disease  Denies any history of asthma but somebody apparently told her she has COPD but she is not sure Denies any connective tissue disease or vasculitis Denies any diabetes or stroke or hepatitis or tuberculosis. Denies any kidney disease She has had the COVID-vaccine but has not had COVID.  ROS:  Positive for shortness of breath with exertion.  Not sure she has cough  FAMILY HISTORY of LUNG DISEASE:  -Her biologic second cousin Gaynell Pettyy has IPF and follows with Dr. [name]. With family history of asthma  PERSONAL EXPOSURE HISTORY:  She used to smoke at least 20 cigarettes a day.  She quit smoking sometime between 2018 2010.  No marijuana smoking no vaping no cocaine use.  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  She did use it quite a lot  HOME  EXPOSURE and HOBBY DETAILS :  Single-family [place] the last 11 years.  Detail organic antigen exposure history in the house is negative  OCCUPATIONAL HISTORY (122 questions) : She grew up in [place] in her childhood although she was born in [place].  Then she moved to [place] for high school and she is lived here since.  She used to work in [employer]	[place] and retired  or swelling.  She has been told in the past that she has COPD.  She has never been on any inhalers for this. Her autoimmune/ILD panel recently completed was significant for positive scleroderma antibody, ANA and rheumatoid factor.  She also had elevated ESR.  She denies any significant joint pain.  Occasionally has some mild pain in her right hip, which she reports is sciatica.  Denies any joint pain or swelling.  She occasionally has some numbness in her hands; does not feel like they are tight.  No significant skin discoloration or changes.  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  She was told by her ophthalmologist that it could possibly be related to an underlying autoimmune disease but she was never evaluated by rheumatology for this.  She is currently on Imuran   OV 10/09/2022  Subjective:  Patient ID: Tina Olson, female , DOB: 24-May-1946 , age 22 y.o. , MRN: [id number] , ADDRESS: [address] [place] [address] PCP Sharmon Revere, MD Patient Care Team: Sharmon Revere, MD as PCP - General (Family Medicine) [name], MD as PCP - Infectious Diseases (Infectious Diseases)  This Provider for this visit: Treatment Team:  Attending Provider: Kalman Shan, MD    10/09/2022 -   Chief Complaint  Patient presents with   Office Visit    PFT today PT on 1.5L O2 at night, breathing better during the day    HPI Tina Olson 77 y.o. -returns for follow-up.  Here to go over test results.  Overall she feels stable.  I was fairly convinced based on low-dose CT scan of the chest that she had interstitial lung disease but she had high-resolution CT chest and thoracic radiologist Dr. Fredirick Lathe says she has emphysema and some very mild bronchiectasis.  There is no ILD.  Her pulmonary function test only shows slight isolated reduction in diffusion capacity but is otherwise normal.  I did share this news with her and she is quite excited about it.  However serology shows positivity for scleroderma antibody [she admits to thickening of the skin is on the dorsum of her palm], mild ANA positivity and mild rheumatoid factor positivity.  She is not aware of these from before.  She says she has a rheumatology appointment pending.  Review of the records indicate appointment Dr. Dimple Casey in January 2024.  Of note: Because of emphysema last visit Katie Cobb put her on Spiriva higher dose which she is supposed to take only 2 puffs once daily but she was taking 2 puffs 2 times daily and a  week after this even though she started feeling better from a shortness of breath perspective started have tingling and call 911 and stop.  She was not aware that she only needed to take it once daily.  After hearing that she is willing to take the low-dose only once daily and then reassess.     In terms of HIV she has upcoming appointment with Dr. Ninetta Lights in December 2023.  Last CD4 count was in March 2023  She has SVTs and she seen Dr. Broadus John in July 2023     OV 03/19/2023  Subjective:  Patient ID: Tina Olson, female , DOB: 10-30-1946 , age 68 y.o. , MRN: [id number] , ADDRESS: [address] [place] [address] PCP Sharmon Revere, MD Patient Care Team: Sharmon Revere, MD as PCP - General (Family Medicine) [name], MD as PCP - Infectious Diseases (Infectious Diseases)  or swelling.  She has been told in the past that she has COPD.  She has never been on any inhalers for this. Her autoimmune/ILD panel recently completed was significant for positive scleroderma antibody, ANA and rheumatoid factor.  She also had elevated ESR.  She denies any significant joint pain.  Occasionally has some mild pain in her right hip, which she reports is sciatica.  Denies any joint pain or swelling.  She occasionally has some numbness in her hands; does not feel like they are tight.  No significant skin discoloration or changes.  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  She was told by her ophthalmologist that it could possibly be related to an underlying autoimmune disease but she was never evaluated by rheumatology for this.  She is currently on Imuran   OV 10/09/2022  Subjective:  Patient ID: Tina Olson, female , DOB: 24-May-1946 , age 22 y.o. , MRN: [id number] , ADDRESS: [address] [place] [address] PCP Sharmon Revere, MD Patient Care Team: Sharmon Revere, MD as PCP - General (Family Medicine) [name], MD as PCP - Infectious Diseases (Infectious Diseases)  This Provider for this visit: Treatment Team:  Attending Provider: Kalman Shan, MD    10/09/2022 -   Chief Complaint  Patient presents with   Office Visit    PFT today PT on 1.5L O2 at night, breathing better during the day    HPI Tina Olson 77 y.o. -returns for follow-up.  Here to go over test results.  Overall she feels stable.  I was fairly convinced based on low-dose CT scan of the chest that she had interstitial lung disease but she had high-resolution CT chest and thoracic radiologist Dr. Fredirick Lathe says she has emphysema and some very mild bronchiectasis.  There is no ILD.  Her pulmonary function test only shows slight isolated reduction in diffusion capacity but is otherwise normal.  I did share this news with her and she is quite excited about it.  However serology shows positivity for scleroderma antibody [she admits to thickening of the skin is on the dorsum of her palm], mild ANA positivity and mild rheumatoid factor positivity.  She is not aware of these from before.  She says she has a rheumatology appointment pending.  Review of the records indicate appointment Dr. Dimple Casey in January 2024.  Of note: Because of emphysema last visit Katie Cobb put her on Spiriva higher dose which she is supposed to take only 2 puffs once daily but she was taking 2 puffs 2 times daily and a  week after this even though she started feeling better from a shortness of breath perspective started have tingling and call 911 and stop.  She was not aware that she only needed to take it once daily.  After hearing that she is willing to take the low-dose only once daily and then reassess.     In terms of HIV she has upcoming appointment with Dr. Ninetta Lights in December 2023.  Last CD4 count was in March 2023  She has SVTs and she seen Dr. Broadus John in July 2023     OV 03/19/2023  Subjective:  Patient ID: Tina Olson, female , DOB: 10-30-1946 , age 68 y.o. , MRN: [id number] , ADDRESS: [address] [place] [address] PCP Sharmon Revere, MD Patient Care Team: Sharmon Revere, MD as PCP - General (Family Medicine) [name], MD as PCP - Infectious Diseases (Infectious Diseases)  PFT     Latest Ref Rng & Units 10/07/2023    2:55 PM 10/09/2022    8:35 AM  ILD indicators  FVC-Pre L 2.31  P 2.25   FVC-Predicted Pre % 98  P 94   FVC-Post L  2.46   FVC-Predicted Post %  103   TLC L  5.32   TLC Predicted %  119   DLCO uncorrected ml/min/mmHg 8.53  P 8.44   DLCO UNC %Pred % 51  P 50   DLCO Corrected ml/min/mmHg 9.07  P 8.44   DLCO COR %Pred % 54  P 50     P Preliminary result      LAB RESULTS last 96 hours No results found.  LAB RESULTS last 90 days Recent Results (from the past 2160 hour(s))  Surgical pathology ([facility])     Status: None   Collection Time: 09/06/23 12:00 AM  Result Value Ref Range   SURGICAL PATHOLOGY      SURGICAL PATHOLOGY [facility] [address], [address] [place], [place] [address] Telephone [phone] or [phone] Fax [phone]  REPORT OF SURGICAL PATHOLOGY   Accession #: [id number] Patient Name: Tina Olson, Tina Olson Visit # : [id number]  MRN: [id number] Physician: Napoleon Form DOB/Age [birth year] (Age: 41) Gender: F Collected Date: 09/06/2023 Received Date: 09/09/2023  FINAL DIAGNOSIS       1. Surgical [P], gastric antrum bx :       -  ANTRAL AND OXYNTIC MUCOSA WITH FEATURES OF BOTH MODERATE CHRONIC INACTIVE      GASTRITIS AND CHEMICAL/REACTIVE CHANGE.      -  AN IMMUNOHISTOCHEMICAL STAIN FOR HELICOBACTER PYLORI ORGANISMS IS NEGATIVE.       2. Surgical [P], random gastric bx :       -  ANTRAL/OXYNTIC MUCOSA WITH MODERATE CHRONIC INACTIVE GASTRITIS AND FOCAL      INTESTINAL METAPLASIA SUGGESTIVE OF ATROPHY.      -  AN IMMUNOHISTOCHEMICAL STAIN FOR HELICOBACTER PYLORI ORGANISMS IS NEGATIVE.       ELECTRONIC SIGNATURE :  Francene Castle, Mark, Pathologist, Electronic Signature  MICROSCOPIC DESCRIPTION  CASE COMMENTS STAINS USED IN DIAGNOSIS: H&E Universal Negative Control-DAB H&E Stains used in diagnosis 1 H. Pylori IHC, 2 H. Pylori IHC Some of these immunohistochemical stains may have been developed and the performance characteristics determined by [facility]  Some may not have been cleared or approved by the U.S. Food and Drug Administration.  The FDA has determined that such clearance or approval is not necessary.  This test is used for clinical purposes.  It should not be regarded as investigational or for research.  This laboratory is certified under the Clinical Laboratory Improvement Amendments of 1988 (CLIA-88) as qualified to perform high complexity clinical laboratory testing.    CLINICAL HISTORY  SPECIMEN(S) OBTAINED 1. Surgical [P], Gastric Antrum Bx 2. Surgical [P], Random Gastric Bx  SPECIMEN COMMENTS: 1. Gastroesophageal reflux disease with esophagi tis, unspecified whether hemorrhage; antral gastritis SPECIMEN CLINICAL INFORMATION: 1. R/O dysplasia and cancer 2. R/O H.pylori    Gross Description 1. Received in formalin are tan, soft tissue fragments that are submitted in toto.Number: multiple, Size:  0.2 cm smallest to 0.4 cm largest, (1B) ( TA ) 2. Received in formalin are tan, soft tissue fragments that are submitted in toto.Number: 3, Size: 0.3 cm smallest to 0.4 cm largest, (1B) ( TA )        Report signed out from the following location(s) [facility]  PFT     Latest Ref Rng & Units 10/07/2023    2:55 PM 10/09/2022    8:35 AM  ILD indicators  FVC-Pre L 2.31  P 2.25   FVC-Predicted Pre % 98  P 94   FVC-Post L  2.46   FVC-Predicted Post %  103   TLC L  5.32   TLC Predicted %  119   DLCO uncorrected ml/min/mmHg 8.53  P 8.44   DLCO UNC %Pred % 51  P 50   DLCO Corrected ml/min/mmHg 9.07  P 8.44   DLCO COR %Pred % 54  P 50     P Preliminary result      LAB RESULTS last 96 hours No results found.  LAB RESULTS last 90 days Recent Results (from the past 2160 hour(s))  Surgical pathology ([facility])     Status: None   Collection Time: 09/06/23 12:00 AM  Result Value Ref Range   SURGICAL PATHOLOGY      SURGICAL PATHOLOGY [facility] [address], [address] [place], [place] [address] Telephone [phone] or [phone] Fax [phone]  REPORT OF SURGICAL PATHOLOGY   Accession #: [id number] Patient Name: Tina Olson, Tina Olson Visit # : [id number]  MRN: [id number] Physician: Napoleon Form DOB/Age [birth year] (Age: 41) Gender: F Collected Date: 09/06/2023 Received Date: 09/09/2023  FINAL DIAGNOSIS       1. Surgical [P], gastric antrum bx :       -  ANTRAL AND OXYNTIC MUCOSA WITH FEATURES OF BOTH MODERATE CHRONIC INACTIVE      GASTRITIS AND CHEMICAL/REACTIVE CHANGE.      -  AN IMMUNOHISTOCHEMICAL STAIN FOR HELICOBACTER PYLORI ORGANISMS IS NEGATIVE.       2. Surgical [P], random gastric bx :       -  ANTRAL/OXYNTIC MUCOSA WITH MODERATE CHRONIC INACTIVE GASTRITIS AND FOCAL      INTESTINAL METAPLASIA SUGGESTIVE OF ATROPHY.      -  AN IMMUNOHISTOCHEMICAL STAIN FOR HELICOBACTER PYLORI ORGANISMS IS NEGATIVE.       ELECTRONIC SIGNATURE :  Francene Castle, Mark, Pathologist, Electronic Signature  MICROSCOPIC DESCRIPTION  CASE COMMENTS STAINS USED IN DIAGNOSIS: H&E Universal Negative Control-DAB H&E Stains used in diagnosis 1 H. Pylori IHC, 2 H. Pylori IHC Some of these immunohistochemical stains may have been developed and the performance characteristics determined by [facility]  Some may not have been cleared or approved by the U.S. Food and Drug Administration.  The FDA has determined that such clearance or approval is not necessary.  This test is used for clinical purposes.  It should not be regarded as investigational or for research.  This laboratory is certified under the Clinical Laboratory Improvement Amendments of 1988 (CLIA-88) as qualified to perform high complexity clinical laboratory testing.    CLINICAL HISTORY  SPECIMEN(S) OBTAINED 1. Surgical [P], Gastric Antrum Bx 2. Surgical [P], Random Gastric Bx  SPECIMEN COMMENTS: 1. Gastroesophageal reflux disease with esophagi tis, unspecified whether hemorrhage; antral gastritis SPECIMEN CLINICAL INFORMATION: 1. R/O dysplasia and cancer 2. R/O H.pylori    Gross Description 1. Received in formalin are tan, soft tissue fragments that are submitted in toto.Number: multiple, Size:  0.2 cm smallest to 0.4 cm largest, (1B) ( TA ) 2. Received in formalin are tan, soft tissue fragments that are submitted in toto.Number: 3, Size: 0.3 cm smallest to 0.4 cm largest, (1B) ( TA )        Report signed out from the following location(s) [facility]  OV 08/16/2022  Subjective:  Patient ID: Tina Olson, female , DOB: 1946/03/17 , age 9 y.o. , MRN: [id number] , ADDRESS: [address] [place] [address] PCP Sharmon Revere, MD Patient Care Team: Sharmon Revere, MD as PCP - General (Family Medicine) [name], MD as PCP - Infectious Diseases (Infectious Diseases)  This Provider for this visit: Treatment Team:  Attending Provider: Kalman Shan, MD    08/16/2022 -   Chief Complaint  Patient presents with   Consult    Pt is here due to recent CT coronary results.  Pt states that she does have some SOB especially if she goes to the mailbox to bend down to pick something up.     HPI  [facility] Comprehensive ILD Questionnaire Darcella Weyland 77 y.o. -77 y.o. 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 at. Patient has hx of pseudophakia both eyes s/p PCIOL OU with Dr. Dione Booze and inflammation in the left eye ever since cataract surgery on 06/22/19. She has hx of HIV since 1993 and hx of Syphilis with treatment over 10 year ago. Currently on Valtrex 1g daily and Azathioprine 50 mg daily. Sees Dr Clovis Pu t Wake   Has HIV Hx of HIV since 1993 and hx of Syphilis over 10 years ago  -sees Dr Ninetta Lights  She is a former smoker who undergoes lung cancer screening as well  She actually has interstitial lung disease mentioned on CT scans in 2020 but she has not been aware of this until she had a cardiac CT in 2023 August.  Therefore she has been referred here.  She tells me that she has had insidious onset of shortness of breath for at least a year and slowly getting worse.  Made worse with exertion and stooping forward.  Improved by rest but there is also variability to it and sometimes it is not there.        Past Medical History :  Is HIV positive and follows with Dr. Ninetta Lights Has ophthalmologic issues and is immunosuppressed and is on Imuran for at  least a few years.   Positive for longstanding history of acid reflux disease  Denies any history of asthma but somebody apparently told her she has COPD but she is not sure Denies any connective tissue disease or vasculitis Denies any diabetes or stroke or hepatitis or tuberculosis. Denies any kidney disease She has had the COVID-vaccine but has not had COVID.  ROS:  Positive for shortness of breath with exertion.  Not sure she has cough  FAMILY HISTORY of LUNG DISEASE:  -Her biologic second cousin Gaynell Pettyy has IPF and follows with Dr. [name]. With family history of asthma  PERSONAL EXPOSURE HISTORY:  She used to smoke at least 20 cigarettes a day.  She quit smoking sometime between 2018 2010.  No marijuana smoking no vaping no cocaine use.  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  She did use it quite a lot  HOME  EXPOSURE and HOBBY DETAILS :  [place] the last 11 years.  Detail organic antigen exposure history in the house is negative  OCCUPATIONAL HISTORY (122 questions) : She grew up in [place] in her childhood although she was born in [place].  Then she moved to [place] for high school and she is lived here since.  She used to work in [employer]	[place] and retired  PFT     Latest Ref Rng & Units 10/07/2023    2:55 PM 10/09/2022    8:35 AM  ILD indicators  FVC-Pre L 2.31  P 2.25   FVC-Predicted Pre % 98  P 94   FVC-Post L  2.46   FVC-Predicted Post %  103   TLC L  5.32   TLC Predicted %  119   DLCO uncorrected ml/min/mmHg 8.53  P 8.44   DLCO UNC %Pred % 51  P 50   DLCO Corrected ml/min/mmHg 9.07  P 8.44   DLCO COR %Pred % 54  P 50     P Preliminary result      LAB RESULTS last 96 hours No results found.  LAB RESULTS last 90 days Recent Results (from the past 2160 hour(s))  Surgical pathology ([facility])     Status: None   Collection Time: 09/06/23 12:00 AM  Result Value Ref Range   SURGICAL PATHOLOGY      SURGICAL PATHOLOGY [facility] [address], [address] [place], [place] [address] Telephone [phone] or [phone] Fax [phone]  REPORT OF SURGICAL PATHOLOGY   Accession #: [id number] Patient Name: Tina Olson, Tina Olson Visit # : [id number]  MRN: [id number] Physician: Napoleon Form DOB/Age [birth year] (Age: 41) Gender: F Collected Date: 09/06/2023 Received Date: 09/09/2023  FINAL DIAGNOSIS       1. Surgical [P], gastric antrum bx :       -  ANTRAL AND OXYNTIC MUCOSA WITH FEATURES OF BOTH MODERATE CHRONIC INACTIVE      GASTRITIS AND CHEMICAL/REACTIVE CHANGE.      -  AN IMMUNOHISTOCHEMICAL STAIN FOR HELICOBACTER PYLORI ORGANISMS IS NEGATIVE.       2. Surgical [P], random gastric bx :       -  ANTRAL/OXYNTIC MUCOSA WITH MODERATE CHRONIC INACTIVE GASTRITIS AND FOCAL      INTESTINAL METAPLASIA SUGGESTIVE OF ATROPHY.      -  AN IMMUNOHISTOCHEMICAL STAIN FOR HELICOBACTER PYLORI ORGANISMS IS NEGATIVE.       ELECTRONIC SIGNATURE :  Francene Castle, Mark, Pathologist, Electronic Signature  MICROSCOPIC DESCRIPTION  CASE COMMENTS STAINS USED IN DIAGNOSIS: H&E Universal Negative Control-DAB H&E Stains used in diagnosis 1 H. Pylori IHC, 2 H. Pylori IHC Some of these immunohistochemical stains may have been developed and the performance characteristics determined by [facility]  Some may not have been cleared or approved by the U.S. Food and Drug Administration.  The FDA has determined that such clearance or approval is not necessary.  This test is used for clinical purposes.  It should not be regarded as investigational or for research.  This laboratory is certified under the Clinical Laboratory Improvement Amendments of 1988 (CLIA-88) as qualified to perform high complexity clinical laboratory testing.    CLINICAL HISTORY  SPECIMEN(S) OBTAINED 1. Surgical [P], Gastric Antrum Bx 2. Surgical [P], Random Gastric Bx  SPECIMEN COMMENTS: 1. Gastroesophageal reflux disease with esophagi tis, unspecified whether hemorrhage; antral gastritis SPECIMEN CLINICAL INFORMATION: 1. R/O dysplasia and cancer 2. R/O H.pylori    Gross Description 1. Received in formalin are tan, soft tissue fragments that are submitted in toto.Number: multiple, Size:  0.2 cm smallest to 0.4 cm largest, (1B) ( TA ) 2. Received in formalin are tan, soft tissue fragments that are submitted in toto.Number: 3, Size: 0.3 cm smallest to 0.4 cm largest, (1B) ( TA )        Report signed out from the following location(s) [facility]  This Provider for this visit: Treatment Team:  Attending Provider: Kalman Shan, MD    03/19/2023 -   Chief Complaint  Patient presents with   Follow-up    Doing well.  Spiriva helping with SOB.  Needs refill on Spiriva    HPI Tina Olson 77 y.o. -returns for follow-up.  From a shortness of breath perspective she is stable.  She says Virgel Bouquet is actually helping her.  In fact symptom scores have improved compared to October 2023.  She says she is doing well.  Review of the records indicate January 2024 she saw Dr. Sheliah Hatch rheumatology who is reassured her about the absence of systemic connective tissue disease.  He felt that the autoimmune antibodies could be false positive potentially.  She is also seen Dr. Johny Sax of infectious diseases 02/26/2023 CD4 counts were normal upon my review.  Mid March 2024 she also saw Dr. Sherryll Burger her ophthalmologist and has been reassured she did indicate that she is getting some bladder scan and abdominal imaging coming up.  But otherwise she is well.  Today on exam I did hear right greater than left lower lobe crackles.  But her  symptom score is better and her walking desaturation test is stable./Normal.  Of note she uses her night oxygen.  She wants to know if she can go to [place] for a few days without portable oxygen on night oxygen.  I reassured her that she could do that.  She also thinks that at 8 PM she needs to put her night oxygen on.  I did indicate to her she only needs to do it when she is going to bed and she is allowed to go to church etc. without oxygen.  OV 10/07/2023  Subjective:  Patient ID: Tina Olson, female , DOB: Jan 18, 1946 , age 52 y.o. , MRN: [id number] , ADDRESS: [address] 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  - Macular pucker oof left retinae  - hx of pseudophakia both eyes s/p PCIOL OU with Dr. Dione Booze and inflammation in the left eye ever since cataract surgery on 06/22/19.  - Currently on Valtrex 1g daily and Azathioprine 50 mg daily.   She has hx of HIV since 1993 andCD4 count 388 02/15/2022   hx of Syphilis with treatment over 10 year ago.   Has HIV Hx of HIV since 1993 and hx of Syphilis over 10 years ago  -sees Dr Ninetta Lights  -CD4 353/5/24  She is a former smoker > 30 ppd -  who undergoes lung cancer screening as well  Zio patch x2 weeks 05/04/2022 showed 37 episodes of SVT, longest lasting 17 beats with average rate 118 bpm, occasional PACs (1.4% of beats).  Echocardiogram 05/09/2022 showed EF 55 to 60%, mild LVH, normal diastolic function, normal RV function, no significant valvular disease  -Dr. Jacelyn Pi  last visit July 2023   GERD:   -Unclear treatment  Concern for ILD but ILD ruled out October 2023  - interstitial lung disease mentioned on CT scans in 2020 but she has

## 2023-10-07 NOTE — Patient Instructions (Signed)
Spirometry/DLCO performed today. 

## 2023-10-07 NOTE — Patient Instructions (Addendum)
ICD-10-CM   1. Pulmonary emphysema, unspecified emphysema type (HCC)  J43.9     2. Former smoker  Z87.891     3. Bronchiectasis without complication (HCC)  J47.9     4. Scl-70 antibody positive  R76.8     5. ANA positive  R76.8     6. Rheumatoid factor positive  R76.8      -Currently emphysema is the main pulmonary issue associated with very mild bronchiectasis - last time I said no ILD but on CT Oct 2024 (report pending) I Thik you might have some lung inflammation - breathing test is unchanged  -noted night o2 is expensive and isnot subjective;ly helping you    Plan - await formal ct report   - if ILD + then see her back sooner - Continue  low-dose Spiriva  1.25 mcg @ 2 puff  each tme but only once daily - continue albuterol as needed - - do spiro dllco in 6-7 mnths -STOP NIGHT O2  - CMA to send order to [facility]  Followup -6 month routine 15 min followup  - walk test, symptom score at followup

## 2023-11-27 ENCOUNTER — Other Ambulatory Visit: Payer: Self-pay | Admitting: Gastroenterology

## 2024-03-10 ENCOUNTER — Other Ambulatory Visit (HOSPITAL_COMMUNITY)
Admission: RE | Admit: 2024-03-10 | Discharge: 2024-03-10 | Disposition: A | Discharge status: Home / Self Care | Source: Ambulatory Visit | Attending: Internal Medicine | Admitting: Internal Medicine

## 2024-03-10 ENCOUNTER — Other Ambulatory Visit (INDEPENDENT_AMBULATORY_CARE_PROVIDER_SITE_OTHER): Payer: 59

## 2024-03-10 DIAGNOSIS — Z79899 Other long term (current) drug therapy: Secondary | ICD-10-CM

## 2024-03-10 DIAGNOSIS — Z113 Encounter for screening for infections with a predominantly sexual mode of transmission: Secondary | ICD-10-CM

## 2024-03-10 DIAGNOSIS — J449 Chronic obstructive pulmonary disease, unspecified: Secondary | ICD-10-CM

## 2024-03-10 DIAGNOSIS — Z21 Asymptomatic human immunodeficiency virus [HIV] infection status: Secondary | ICD-10-CM

## 2024-03-11 LAB — URINE CYTOLOGY ANCILLARY ONLY
Chlamydia: NEGATIVE
Comment: NEGATIVE
Comment: NORMAL
Neisseria Gonorrhea: NEGATIVE

## 2024-03-11 LAB — T-HELPER CELL (CD4) - (RCID CLINIC ONLY)
CD4 % Helper T Cell: 30 % — ABNORMAL LOW (ref 33–65)
CD4 T Cell Abs: 293 /uL — ABNORMAL LOW (ref 400–1790)

## 2024-03-12 LAB — COMPREHENSIVE METABOLIC PANEL WITH GFR
ALT: 23 IU/L (ref 0–32)
AST: 29 IU/L (ref 0–40)
Albumin: 4.3 g/dL (ref 3.8–4.8)
Alkaline Phosphatase: 62 IU/L (ref 44–121)
BUN/Creatinine Ratio: 15 (ref 12–28)
BUN: 13 mg/dL (ref 8–27)
Bilirubin Total: 0.6 mg/dL (ref 0.0–1.2)
CO2: 20 mmol/L (ref 20–29)
Calcium: 10.2 mg/dL (ref 8.7–10.3)
Chloride: 104 mmol/L (ref 96–106)
Creatinine, Ser: 0.87 mg/dL (ref 0.57–1.00)
Globulin, Total: 4.2 g/dL (ref 1.5–4.5)
Glucose: 108 mg/dL — ABNORMAL HIGH (ref 70–99)
Potassium: 4.2 mmol/L (ref 3.5–5.2)
Sodium: 138 mmol/L (ref 134–144)
Total Protein: 8.5 g/dL (ref 6.0–8.5)
eGFR: 69 mL/min/{1.73_m2} (ref >59)

## 2024-03-12 LAB — CBC
Hematocrit: 35.9 % (ref 34.0–46.6)
Hemoglobin: 12.1 g/dL (ref 11.1–15.9)
MCH: 31.3 pg (ref 26.6–33.0)
MCHC: 33.7 g/dL (ref 31.5–35.7)
MCV: 93 fL (ref 79–97)
Platelets: 153 10*3/uL (ref 150–450)
RBC: 3.87 x10E6/uL (ref 3.77–5.28)
RDW: 14.4 % (ref 11.7–15.4)
WBC: 2.7 10*3/uL — ABNORMAL LOW (ref 3.4–10.8)

## 2024-03-12 LAB — LIPID PANEL
Chol/HDL Ratio: 2.3 ratio (ref 0.0–4.4)
Cholesterol, Total: 134 mg/dL (ref 100–199)
HDL: 59 mg/dL (ref >39)
LDL Chol Calc (NIH): 57 mg/dL (ref 0–99)
Triglycerides: 99 mg/dL (ref 0–149)
VLDL Cholesterol Cal: 18 mg/dL (ref 5–40)

## 2024-03-12 LAB — ALPHA-1-ANTITRYPSIN: A-1 Antitrypsin: 153 mg/dL (ref 101–187)

## 2024-03-12 LAB — HIV-1 RNA QUANT-NO REFLEX-BLD
HIV-1 RNA Viral Load Log: 2.23 {Log_copies}/mL
HIV-1 RNA Viral Load: 170 {copies}/mL

## 2024-03-12 LAB — RPR: RPR Ser Ql: NONREACTIVE

## 2024-03-17 ENCOUNTER — Ambulatory Visit: Admitting: Infectious Diseases

## 2024-03-17 ENCOUNTER — Encounter: Payer: Self-pay | Admitting: Infectious Diseases

## 2024-03-17 VITALS — BP 122/65 | HR 58 | Temp 97.8°F | Ht 60.0 in | Wt 132.2 lb

## 2024-03-17 DIAGNOSIS — K219 Gastro-esophageal reflux disease without esophagitis: Secondary | ICD-10-CM | POA: Diagnosis not present

## 2024-03-17 DIAGNOSIS — I10 Essential (primary) hypertension: Secondary | ICD-10-CM | POA: Diagnosis not present

## 2024-03-17 DIAGNOSIS — J849 Interstitial pulmonary disease, unspecified: Secondary | ICD-10-CM

## 2024-03-17 DIAGNOSIS — Z21 Asymptomatic human immunodeficiency virus [HIV] infection status: Secondary | ICD-10-CM

## 2024-03-17 NOTE — Assessment & Plan Note (Signed)
 Will get her back in with GI Suggest she may need another endoscopy

## 2024-03-17 NOTE — Assessment & Plan Note (Signed)
 Awaiting call back from pulm clinic Will reset her referral.

## 2024-03-17 NOTE — Assessment & Plan Note (Signed)
 Doing well, appreciate her PCP f/u

## 2024-03-17 NOTE — Progress Notes (Signed)
 Subjective:    Patient ID: Tina Olson, female  DOB: 04/15/1946, 78 y.o.        MRN: [id number]   HPI 78 yo F with HIV+/elite controller, HTN and Hepatitis C (1b). Was started on harvoni in July 2015 for her Hep C (VL 6.5 million) and F0 elastography/ultrasound. She was not able to tolerate this (thinks she took ~ 1/2 of the rx, didn't get last bottle). Her repeat Hep C RNA were negative (10-04-14) (06-27-15)   She has had 2 prev colonoscopies. (-).  She has seen optho for pseudophakia, cataracts, focal chorioretinal inflammation. She feels like her vision is good, she takes rx (imuran, valtrex) for this.     No ART.   She is off home O2 at night for her suspcted LD/COPD  (which she is followed by rheum, pulmonary for). States she uses very little.  She has repeat CT scan pending as well.     Prev breast bx 2009- calcifications.  Mammo 08-2023; (-) Birads 1.    last COVID 04-2021. Got flu shot today. Has had RSV vaccine.    EGD 9-27 showed chronic active gastritits and H pylori stains (-). She has been having worsening sx of GERD. Taking peptobismal, myalnta, ginger ale, beano gaviscon.  She has also been on famotidine, which she takes in the am. She quit the afternoon dose and feels better. She can eat ice cream but not chocolate, lemonade, citrus drinks, eat pizza pasta, any spicey food (chicken alfredo).  Has been eating a bland diet. Chicken <- baked -> pork chops. Scrambled eggs.  Prev saw Dr Lavon Paganini. Also on protonix.   HIV 1 RNA Quant  Date Value  02/15/2022 31 Copies/mL (H)  05/18/2021 118 copies/mL (H)  11/24/2020 93 Copies/mL (H)   HIV-1 RNA Viral Load (copies/mL)  Date Value  03/10/2024 170  09/10/2023 70  02/12/2023 130   CD4 T Cell Abs (/uL)  Date Value  03/10/2024 293 (L)  09/10/2023 237 (L)  02/12/2023 350 (L)     Health Maintenance  Topic Date Due  . DTaP/Tdap/Td (1 - Tdap) Never done  . Zoster Vaccines- Shingrix (1 of 2) Never done  .  COVID-19 Vaccine (4 - 2024-25 season) 08/11/2023  . Medicare Annual Wellness (AWV)  02/26/2024  . INFLUENZA VACCINE  07/10/2024  . Pneumonia Vaccine 74+ Years old  Completed  . DEXA SCAN  Completed  . Hepatitis C Screening  Completed  . HPV VACCINES  Aged Out  . Lung Cancer Screening  Discontinued  . Colonoscopy  Discontinued      Review of Systems  Constitutional:  Negative for chills, fever and weight loss.  Respiratory:  Negative for cough and shortness of breath.   Cardiovascular:  Negative for chest pain.  Gastrointestinal:  Positive for constipation (takkng miralax, prune juice as well) and heartburn. Negative for diarrhea, nausea and vomiting.  Genitourinary:  Negative for dysuria.   Please see HPI. All other systems reviewed and negative.     Objective:  Physical Exam Vitals reviewed.  Constitutional:      General: She is not in acute distress.    Appearance: Normal appearance. She is not ill-appearing or toxic-appearing.  HENT:     Mouth/Throat:     Mouth: Mucous membranes are moist.     Pharynx: No oropharyngeal exudate.  Eyes:     Extraocular Movements: Extraocular movements intact.     Pupils: Pupils are equal, round, and reactive to light.  Cardiovascular:  Rate and Rhythm: Normal rate and regular rhythm.  Pulmonary:     Effort: Pulmonary effort is normal.     Breath sounds: Normal breath sounds.  Abdominal:     General: Bowel sounds are normal. There is no distension.     Palpations: Abdomen is soft.     Tenderness: There is no abdominal tenderness.  Musculoskeletal:        General: Normal range of motion.     Cervical back: Normal range of motion and neck supple.     Right lower leg: No edema.     Left lower leg: No edema.  Neurological:     Mental Status: She is alert.          Assessment & Plan:

## 2024-03-17 NOTE — Assessment & Plan Note (Signed)
 She is doing well off meds Will continue to monitor PCV 20 2026 Doing well Rtc in 6 months.

## 2024-05-02 ENCOUNTER — Ambulatory Visit (HOSPITAL_COMMUNITY)
Admission: EM | Admit: 2024-05-02 | Discharge: 2024-05-02 | Disposition: A | Discharge status: Home / Self Care | Attending: Emergency Medicine | Admitting: Emergency Medicine

## 2024-05-02 ENCOUNTER — Encounter (HOSPITAL_COMMUNITY): Payer: Self-pay | Admitting: Emergency Medicine

## 2024-05-02 DIAGNOSIS — J069 Acute upper respiratory infection, unspecified: Secondary | ICD-10-CM | POA: Diagnosis not present

## 2024-05-02 MED ORDER — AZELASTINE HCL 0.1 % NA SOLN: 2.0000 | Freq: Two times a day (BID) | NASAL | 0 refills | Status: AC

## 2024-05-02 MED ORDER — BENZONATATE 100 MG PO CAPS: 100.0000 mg | ORAL_CAPSULE | Freq: Three times a day (TID) | ORAL | 0 refills | Status: DC

## 2024-05-02 NOTE — ED Triage Notes (Signed)
 Since Tuesday had chills, sore throat, cough, nasal congestion. Took Allergy medication and OTC cold medication.

## 2024-05-02 NOTE — Discharge Instructions (Signed)
 I believe your symptoms are likely related to a viral respiratory illness.   Take Tessalon every 8 hours as needed for cough. Use azelastine nasal spray twice daily to help with congestion. You can also take over-the-counter Mucinex (guaifenesin) as needed for cough and congestion.  Make sure you are staying hydrated and getting plenty of rest. Follow-up with primary care return here if your symptoms persist or worsen.

## 2024-05-02 NOTE — ED Provider Notes (Signed)
 [facility]    CSN: [id number] Arrival date & time: 05/02/24  1005      History   Chief Complaint Chief Complaint  Patient presents with   Cough   Nasal Congestion    HPI Tina Olson is a 78 y.o. female.   Patient presents with chills, sore throat, congestion, and mild cough that began on 5/20.  Patient states the first 2 days she had chills and sweats which has since relieved.  Patient states that her symptoms seem to be getting better than they have been over the last few days.  Patient reports taking allergy medication and Alka-Seltzer cold medication with relief.  Denies shortness of breath, chest pain, known fever, abdominal pain, vomiting, and diarrhea.  History of COPD, GERD, hypertension, and HIV.  The history is provided by the patient and medical records.  Cough   Past Medical History:  Diagnosis Date   Acid reflux    Anemia 2017   COPD (chronic obstructive pulmonary disease) (HCC) 03/19/2023   Difficult intravenous access    Hepatitis C 07/2014   took harvani tx    History of blood transfusion 2017   2 units   HIV infection (HCC) since 1993   Hypertension    Internal hemorrhoid    Right inguinal hernia     Patient Active Problem List   Diagnosis Date Noted   GERD (gastroesophageal reflux disease) 03/17/2024   History of hepatitis C 01/01/2023   Positive ANA (antinuclear antibody) 01/01/2023   Acute on chronic anemia 11/07/2022   HLD (hyperlipidemia) 11/07/2022   ILD (interstitial lung disease) (HCC) 09/06/2022   Shortness of breath 09/06/2022   Centrilobular emphysema (HCC) 09/06/2022   Normocytic anemia 06/14/2021   Screening for cervical cancer 11/24/2020   Atherosclerosis of aorta (HCC) 06/30/2020   COPD (chronic obstructive pulmonary disease) (HCC) 06/30/2020   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 12/22/2019   Pseudophakia of both eyes 12/22/2019   Macular pucker, left eye 12/22/2019   Chest pain  01/26/2017   Microcytic anemia 01/26/2017   Chronic leukopenia 01/26/2017   Constipation 01/26/2017   Essential hypertension 10/08/2011   SHELLFISH ALLERGY 10/20/2009   HIV (human immunodeficiency virus infection) (HCC) 04/18/2009   BREAST MASS, BENIGN 04/18/2009   Melena 04/18/2009    Past Surgical History:  Procedure Laterality Date   BREAST BIOPSY Right 07/26/2008   CATARACT EXTRACTION Right 2016   CATARACT EXTRACTION Left 06/2019   COLONOSCOPY WITH PROPOFOL  N/A 06/11/2017   Procedure: COLONOSCOPY WITH PROPOFOL ;  Surgeon: Sergio Dandy, MD;  Location: [facility];  Service: Endoscopy;  Laterality: N/A;  PT IS KNOWN HEART STICK. WILL PROBABLY REQUIRE ULTRASOUND OR ANESTHESIA FOR IV STICK.   ESOPHAGOGASTRODUODENOSCOPY (EGD) WITH PROPOFOL  N/A 06/11/2017   Procedure: ESOPHAGOGASTRODUODENOSCOPY (EGD) WITH PROPOFOL ;  Surgeon: Sergio Dandy, MD;  Location: [facility];  Service: Endoscopy;  Laterality: N/A;    OB History   No obstetric history on file.      Home Medications    Prior to Admission medications   Medication Sig Start Date End Date Taking? Authorizing Provider  azelastine (ASTELIN) 0.1 % nasal spray Place 2 sprays into both nostrils 2 (two) times daily. Use in each nostril as directed 05/02/24  Yes Levora Reas A, NP  benzonatate (TESSALON) 100 MG capsule Take 1 capsule (100 mg total) by mouth every 8 (eight) hours. 05/02/24  Yes Rosevelt Constable, Karmella Bouvier A, NP  acetaminophen  (TYLENOL ) 325 MG tablet Take 325 mg by mouth daily as needed  for moderate pain or headache.    [provider]  amLODipine  (NORVASC ) 10 MG tablet Take 10 mg by mouth daily. 04/13/21   [provider]  Ascorbic Acid (VITAMIN C PO) Take 1 tablet by mouth daily.    [provider]  aspirin 81 MG chewable tablet Chew 1 tablet by mouth daily.    [provider]  azaTHIOprine  (IMURAN ) 50 MG tablet Take 25 mg by mouth daily. 07/08/20   [provider]  BIOTIN  PO Take 500 mcg by mouth daily.    [provider]  calcium  carbonate (OSCAL) 1500 (600 Ca) MG TABS tablet Take 1,500 mg by mouth daily with breakfast.    [provider]  Cyanocobalamin (VITAMIN B12 PO) Take 1 tablet by mouth daily.    [provider]  docusate sodium (COLACE) 100 MG capsule Take 100 mg by mouth as needed for mild constipation.    [provider]  famotidine (PEPCID) 40 MG tablet Take 40 mg by mouth 2 (two) times daily. 06/10/23   [provider]  ferrous sulfate  325 (65 FE) MG tablet Take 1 tablet (325 mg total) by mouth daily. 11/07/22 01/01/23  Lesa Rape, MD  hydrocortisone  (ANUSOL -HC) 25 MG suppository Place 25 mg rectally daily as needed. 03/28/23   [provider]  Misc Natural Products (NEURIVA PO) Take 1 capsule by mouth daily.    [provider]  OXYGEN Inhale 1.5 L into the lungs at bedtime.    [provider]  pantoprazole  (PROTONIX ) 40 MG tablet TAKE 1 TABLET BY MOUTH TWICE  DAILY 11/28/23   Nandigam, Kavitha V, MD  polyethylene glycol (MIRALAX  / GLYCOLAX ) packet Take 17 g by mouth every other day.    [provider]  rosuvastatin  (CRESTOR ) 20 MG tablet Take 20 mg by mouth in the morning. 05/21/22   [provider]  SPIRIVA  RESPIMAT 1.25 MCG/ACT AERS USE 2 INHALATIONS BY MOUTH DAILY 08/14/23   Maire Scot, MD  valACYclovir (VALTREX) 1000 MG tablet Take 1,000 mg by mouth daily.  12/22/19   [provider]  Zinc 50 MG TABS Take 50 mg by mouth daily.    [provider]    Family History Family History  Problem Relation Age of Onset   Alzheimer's disease Mother    Glaucoma Mother    Hypertension Mother    Cancer Father        ? type   Seizures Son    Stomach cancer Neg Hx    Rectal cancer Neg Hx    Colon cancer Neg Hx    Esophageal cancer Neg Hx     Social History Social History   Tobacco Use   Smoking status: Former    Current packs/day: 0.00     Average packs/day: 0.8 packs/day for 45.0 years (33.8 ttl pk-yrs)    Types: Cigarettes    Start date: 2    Quit date: 2010    Years since quitting: 15.4    Passive exposure: Past   Smokeless tobacco: Never  Vaping Use   Vaping status: Never Used  Substance Use Topics   Alcohol use: No    Alcohol/week: 0.0 standard drinks of alcohol   Drug use: No    Types: IV    Comment: clean since around 2000     Allergies   Iodinated contrast media, Lisinopril , and Other   Review of Systems Review of Systems  Respiratory:  Positive for cough.    Per HPI  Physical Exam Triage Vital Signs ED Triage Vitals  Encounter Vitals Group     BP 05/02/24 1019 133/70     Systolic BP Percentile --      Diastolic BP Percentile --      Pulse Rate 05/02/24 1019 63     Resp 05/02/24 1019 18     Temp 05/02/24 1019 98.3 F (36.8 C)     Temp Source 05/02/24 1019 Oral     SpO2 05/02/24 1019 96 %     Weight --      Height --      Head Circumference --      Peak Flow --      Pain Score 05/02/24 1017 4     Pain Loc --      Pain Education --      Exclude from Growth Chart --    No data found.  Updated Vital Signs BP 133/70 (BP Location: Left Arm)   Pulse 63   Temp 98.3 F (36.8 C) (Oral)   Resp 18   SpO2 96%   Visual Acuity Right Eye Distance:   Left Eye Distance:   Bilateral Distance:    Right Eye Near:   Left Eye Near:    Bilateral Near:     Physical Exam Vitals and nursing note reviewed.  Constitutional:      General: She is awake. She is not in acute distress.    Appearance: Normal appearance. She is well-developed and well-groomed. She is not ill-appearing.  HENT:     Right Ear: Tympanic membrane, ear canal and external ear normal.     Left Ear: Tympanic membrane, ear canal and external ear normal.     Nose: Congestion and rhinorrhea present.     Right Sinus: No maxillary sinus tenderness or frontal sinus tenderness.     Left Sinus: No maxillary sinus tenderness or  frontal sinus tenderness.     Mouth/Throat:     Mouth: Mucous membranes are moist.     Pharynx: Posterior oropharyngeal erythema and postnasal drip present. No oropharyngeal exudate.  Cardiovascular:     Rate and Rhythm: Normal rate and regular rhythm.  Pulmonary:     Effort: Pulmonary effort is normal.     Breath sounds: Normal breath sounds.  Lymphadenopathy:     Cervical: No cervical adenopathy.  Skin:    General: Skin is warm and dry.  Neurological:     Mental Status: She is alert.  Psychiatric:        Behavior: Behavior is cooperative.      UC Treatments / Results  Labs (all labs ordered are listed, but only abnormal results are displayed) Labs Reviewed - No data to display  EKG   Radiology No results found.  Procedures Procedures (including critical care time)  Medications Ordered in UC Medications - No data to display  Initial Impression / Assessment and Plan / UC Course  I have reviewed the triage vital signs and the nursing notes.  Pertinent labs & imaging results that were available during my care of the patient were reviewed by me and considered in my medical decision making (see chart for details).     Patient is well-appearing.  Vitals are stable.  Upon assessment congestion and rhinorrhea are present, mild erythema and PND noted to pharynx.  Lungs clear bilaterally auscultation.  No obvious signs of bacterial infection at this time.  Symptoms likely viral in nature.  Prescribed Tessalon as needed for cough.  Prescribed azelastine nasal  spray to help with congestion.  Discussed over-the-counter medication for symptoms.  Discussed follow-up and return precautions. Final Clinical Impressions(s) / UC Diagnoses   Final diagnoses:  Viral URI with cough     Discharge Instructions      I believe your symptoms are likely related to a viral respiratory illness.   Take Tessalon every 8 hours as needed for cough. Use azelastine nasal spray twice daily to  help with congestion. You can also take over-the-counter Mucinex (guaifenesin) as needed for cough and congestion.  Make sure you are staying hydrated and getting plenty of rest. Follow-up with primary care return here if your symptoms persist or worsen.  ED Prescriptions     Medication Sig Dispense Auth. Provider   azelastine (ASTELIN) 0.1 % nasal spray Place 2 sprays into both nostrils 2 (two) times daily. Use in each nostril as directed 30 mL Levora Reas A, NP   benzonatate (TESSALON) 100 MG capsule Take 1 capsule (100 mg total) by mouth every 8 (eight) hours. 21 capsule Levora Reas A, NP      PDMP not reviewed this encounter.   Levora Reas A, NP 05/02/24 1037

## 2024-05-14 ENCOUNTER — Ambulatory Visit: Payer: Self-pay | Admitting: Surgery

## 2024-05-14 NOTE — Progress Notes (Signed)
 RACHEL RISON [id number]   Referring Provider:  Claven Cumming, MD ID-Dr. Zulma Hitt Pulm-Dr. Maire Scot  Subjective   Chief Complaint: New Consultation   History of Present Illness:    Very pleasant 78 year old woman with history of anemia, atherosclerosis, COPD (last saw pulmonology on 10/07/2023, has follow-up appointment with them at the end of this month, not currently on any oxygen), hepatitis C (partially treated with Harvoni  with negative follow-up labs), HIV (on no meds since mid 90s), hypertension, internal hemorrhoids, GERD who presents for evaluation of a right inguinal hernia. No previous abdominal surgery.  Hernia has been present for several years but has continued to increase in size and causes episodes of pain.  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  Does have chronic constipation.  Open repair was planned and attempted, but case was unable to proceed due to no IV access despite numerous attempts with ultrasound by CRNA and attending anesthesiologist, and plan was to reschedule with coordination with IR for access prior to the case.  Since that time she was able to undergo endoscopy and states that they were able to establish an IV for that.  Labs 03/10/24 include unremarkable CMP, lipid panel; CBC notable for white count of 2.7 and CD4/helper cells also noted to be low.  She saw Dr. Alwin Baars last on 4/8-plan was continued monitoring off ART  CT scan done in December 2020 is the last abdominal imaging she has, I have re-reviewed this and this does demonstrate bilateral fat-containing inguinal hernias, right greater than left.  At her last visit with me 4 years ago, the left side was asymptomatic/not palpable and the right side was reducible.  Review of Systems: A complete review of systems was obtained from the patient.  I  have reviewed this information and discussed as appropriate with the patient.  See HPI as well for other ROS.   Medical History: Past Medical History:  Diagnosis Date   COPD (chronic obstructive pulmonary disease) (CMS/HHS-HCC)    GERD (gastroesophageal reflux disease)     There is no problem list on file for this patient.   Past Surgical History:  Procedure Laterality Date   CATARACT EXTRACTION       Allergies  Allergen Reactions   Iodinated Contrast Media Hives and Itching   Lisinopril  Cough and Itching   Other Hives, Itching and Other (See Comments)    Seafood crustaceans  "itchy throat"   Seafood crustaceans  "itchy throat"    Current Outpatient Medications on File Prior to Visit  Medication Sig Dispense Refill   acetaminophen  (TYLENOL ) 325 MG tablet Take 325 mg by mouth     amLODIPine  (NORVASC ) 10 MG tablet      aspirin 81 MG chewable tablet Take 81 mg by mouth once daily     azaTHIOprine  (IMURAN ) 50 mg tablet Take 50 mg by mouth once daily     azelastine  (ASTELIN ) 137 mcg nasal spray Place 2 sprays into one nostril     benzonatate  (TESSALON ) 100 MG capsule Take 100 mg by mouth every 8 (eight) hours     calcium  carbonate 600 mg calcium  (1,500 mg) Tab tablet Take 1,500 mg by mouth     docusate (COLACE) 100 MG capsule Take 100 mg by mouth     famotidine (PEPCID) 40 MG tablet Take 40 mg by  mouth 2 (two) times daily     ferrous sulfate  325 (65 FE) MG tablet Take 325 mg by mouth once daily     fluticasone  propionate (FLONASE ) 50 mcg/actuation nasal spray 1 spray     hydrocortisone  (ANUSOL -HC) 25 mg suppository Place 25 mg rectally     pantoprazole  (PROTONIX ) 40 MG DR tablet Take 1 tablet by mouth 2 (two) times daily     polyethylene glycol (MIRALAX ) packet Take 17 g by mouth     rosuvastatin  (CRESTOR ) 20 MG tablet      SPIRIVA  RESPIMAT 2.5 mcg/actuation inhalation spray      No current facility-administered medications on file prior to visit.    Family History   Problem Relation Age of Onset   High blood pressure (Hypertension) Mother      Social History   Tobacco Use  Smoking Status Never  Smokeless Tobacco Never     Social History   Socioeconomic History   Marital status: Widowed  Tobacco Use   Smoking status: Never   Smokeless tobacco: Never  Substance and Sexual Activity   Alcohol use: Never   Drug use: Never   Social Drivers of Corporate investment banker Strain: Low Risk  (02/26/2023)   Received from [facility]   Overall Financial Resource Strain (CARDIA)    Difficulty of Paying Living Expenses: Not hard at all  Food Insecurity: No Food Insecurity (02/26/2023)   Received from [facility]   Hunger Vital Sign    Worried About Running Out of Food in the Last Year: Never true    Ran Out of Food in the Last Year: Never true  Transportation Needs: No Transportation Needs (02/26/2023)   Received from [facility] - [facility]    Lack of Transportation (Medical): No    Lack of Transportation (Non-Medical): No  Physical Activity: Inactive (02/26/2023)   Received from [facility]   Exercise Vital Sign    Days of Exercise per Week: 0 days    Minutes of Exercise per Session: 0 min  Stress: No Stress Concern Present (02/26/2023)   Received from [facility] - Occupational Stress Questionnaire    Feeling of Stress : Not at all  Social Connections: Moderately Integrated (02/26/2023)   Received from [facility]   Social Connection and Isolation Panel [NHANES]    Frequency of Communication with Friends and Family: More than three times a week    Frequency of Social Gatherings with Friends and Family: More than three times a week    Attends Religious Services: More than 4 times per year    Active Member of [employer] or Organizations: Yes    Attends Banker Meetings: More than 4 times per year    Marital Status: Widowed  Housing Stability: Unknown (05/14/2024)   Housing  Stability Vital Sign    Homeless in the Last Year: No    Objective:    Vitals:   05/14/24 0940  BP: 128/60  Pulse: 66  Temp: 36.5 C (97.7 F)  SpO2: 96%  Weight: 56.9 kg (125 lb 6.4 oz)  Height: 152.4 cm (5')  PainSc: 0-No pain  PainLoc: Abdomen    Body mass index is 24.49 kg/m.  Gen: A&Ox3, no distress  Chest: respiratory effort is normal. Abdomen: soft, nondistended, nontender.  Reducible right inguinal hernia.  No palpable hernia on the left. Neuro: no gross deficit Psych: appropriate mood and affect, normal insight/judgment intact  Skin: warm  and dry  Assessment and Plan:  Diagnoses and all orders for this visit:  Non-recurrent bilateral inguinal hernia without obstruction or gangrene -     Ambulatory Referral to Interventional Radiology  Left side remains asymptomatic and nonpalpable on exam.  I do recommend repair of the right side given ongoing symptoms of pain and protrusion.  Increasing in size. We discussed the relevant anatomy and we discussed options for repair.  I recommend an open approach and went over the technique of the procedure.  Discussed risks of bleeding, infection, pain, scarring, injury to structures in the area including nerves, blood vessels, bowel, or bladder; risk of chronic pain, hernia recurrence, risk of seroma or hematoma, urinary retention, and risks of general anesthesia including cardiovascular, pulmonary, and thromboembolic complications.  We discussed typical postop recovery, timeline, and activity limitations.  We also discussed the option of ongoing observation, with high rate of ultimately returning for surgery and risk of increasing size/symptoms from the hernia as well as incarceration/strangulation and went over symptoms that should prompt the patient to seek emergency treatment.  Questions were welcomed and answered to the patient's satisfaction. Patient wishes to proceed with scheduling, anticipate early July.   - Pulmonology  clearance-she will see Dr. Bertrum Brodie at the end of this month - Will make referral for IR eval for preoperative IV access and coordinate this for day of surgery.  While they were able to establish IV access for her endoscopy last fall, would not want to risk putting her through what she went through when we attempted surgery a few years ago.     Aldon Hung MD FACS

## 2024-05-14 NOTE — H&P (Signed)
 Tina Olson [id number]   Referring Provider:  Claven Cumming, MD ID-Dr. Zulma Hitt Pulm-Dr. Maire Scot  Subjective   Chief Complaint: New Consultation   History of Present Illness:    Very pleasant 78 year old woman with history of anemia, atherosclerosis, COPD (last saw pulmonology on 10/07/2023, has follow-up appointment with them at the end of this month, not currently on any oxygen), hepatitis C (partially treated with Harvoni  with negative follow-up labs), HIV (on no meds since mid 90s), hypertension, internal hemorrhoids, GERD who presents for evaluation of a right inguinal hernia. No previous abdominal surgery.  Hernia has been present for several years but has continued to increase in size and causes episodes of pain.  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  Does have chronic constipation.  Open repair was planned and attempted, but case was unable to proceed due to no IV access despite numerous attempts with ultrasound by CRNA and attending anesthesiologist, and plan was to reschedule with coordination with IR for access prior to the case.  Since that time she was able to undergo endoscopy and states that they were able to establish an IV for that.  Labs 03/10/24 include unremarkable CMP, lipid panel; CBC notable for white count of 2.7 and CD4/helper cells also noted to be low.  She saw Dr. Alwin Baars last on 4/8-plan was continued monitoring off ART  CT scan done in December 2020 is the last abdominal imaging she has, I have re-reviewed this and this does demonstrate bilateral fat-containing inguinal hernias, right greater than left.  At her last visit with me 4 years ago, the left side was asymptomatic/not palpable and the right side was reducible.  Review of Systems: A complete review of systems was obtained from the patient.  I  have reviewed this information and discussed as appropriate with the patient.  See HPI as well for other ROS.   Medical History: Past Medical History:  Diagnosis Date   COPD (chronic obstructive pulmonary disease) (CMS/HHS-HCC)    GERD (gastroesophageal reflux disease)     There is no problem list on file for this patient.   Past Surgical History:  Procedure Laterality Date   CATARACT EXTRACTION       Allergies  Allergen Reactions   Iodinated Contrast Media Hives and Itching   Lisinopril  Cough and Itching   Other Hives, Itching and Other (See Comments)    Seafood crustaceans  "itchy throat"   Seafood crustaceans  "itchy throat"    Current Outpatient Medications on File Prior to Visit  Medication Sig Dispense Refill   acetaminophen  (TYLENOL ) 325 MG tablet Take 325 mg by mouth     amLODIPine  (NORVASC ) 10 MG tablet      aspirin 81 MG chewable tablet Take 81 mg by mouth once daily     azaTHIOprine  (IMURAN ) 50 mg tablet Take 50 mg by mouth once daily     azelastine  (ASTELIN ) 137 mcg nasal spray Place 2 sprays into one nostril     benzonatate  (TESSALON ) 100 MG capsule Take 100 mg by mouth every 8 (eight) hours     calcium  carbonate 600 mg calcium  (1,500 mg) Tab tablet Take 1,500 mg by mouth     docusate (COLACE) 100 MG capsule Take 100 mg by mouth     famotidine (PEPCID) 40 MG tablet Take 40 mg by  mouth 2 (two) times daily     ferrous sulfate  325 (65 FE) MG tablet Take 325 mg by mouth once daily     fluticasone  propionate (FLONASE ) 50 mcg/actuation nasal spray 1 spray     hydrocortisone  (ANUSOL -HC) 25 mg suppository Place 25 mg rectally     pantoprazole  (PROTONIX ) 40 MG DR tablet Take 1 tablet by mouth 2 (two) times daily     polyethylene glycol (MIRALAX ) packet Take 17 g by mouth     rosuvastatin  (CRESTOR ) 20 MG tablet      SPIRIVA  RESPIMAT 2.5 mcg/actuation inhalation spray      No current facility-administered medications on file prior to visit.    Family History   Problem Relation Age of Onset   High blood pressure (Hypertension) Mother      Social History   Tobacco Use  Smoking Status Never  Smokeless Tobacco Never     Social History   Socioeconomic History   Marital status: Widowed  Tobacco Use   Smoking status: Never   Smokeless tobacco: Never  Substance and Sexual Activity   Alcohol use: Never   Drug use: Never   Social Drivers of Corporate investment banker Strain: Low Risk  (02/26/2023)   Received from [facility]   Overall Financial Resource Strain (CARDIA)    Difficulty of Paying Living Expenses: Not hard at all  Food Insecurity: No Food Insecurity (02/26/2023)   Received from [facility]   Hunger Vital Sign    Worried About Running Out of Food in the Last Year: Never true    Ran Out of Food in the Last Year: Never true  Transportation Needs: No Transportation Needs (02/26/2023)   Received from [facility] - [facility]    Lack of Transportation (Medical): No    Lack of Transportation (Non-Medical): No  Physical Activity: Inactive (02/26/2023)   Received from [facility]   Exercise Vital Sign    Days of Exercise per Week: 0 days    Minutes of Exercise per Session: 0 min  Stress: No Stress Concern Present (02/26/2023)   Received from [facility] - Occupational Stress Questionnaire    Feeling of Stress : Not at all  Social Connections: Moderately Integrated (02/26/2023)   Received from [facility]   Social Connection and Isolation Panel [NHANES]    Frequency of Communication with Friends and Family: More than three times a week    Frequency of Social Gatherings with Friends and Family: More than three times a week    Attends Religious Services: More than 4 times per year    Active Member of [employer] or Organizations: Yes    Attends Banker Meetings: More than 4 times per year    Marital Status: Widowed  Housing Stability: Unknown (05/14/2024)   Housing  Stability Vital Sign    Homeless in the Last Year: No    Objective:    Vitals:   05/14/24 0940  BP: 128/60  Pulse: 66  Temp: 36.5 C (97.7 F)  SpO2: 96%  Weight: 56.9 kg (125 lb 6.4 oz)  Height: 152.4 cm (5')  PainSc: 0-No pain  PainLoc: Abdomen    Body mass index is 24.49 kg/m.  Gen: A&Ox3, no distress  Chest: respiratory effort is normal. Abdomen: soft, nondistended, nontender.  Reducible right inguinal hernia.  No palpable hernia on the left. Neuro: no gross deficit Psych: appropriate mood and affect, normal insight/judgment intact  Skin: warm  and dry  Assessment and Plan:  Diagnoses and all orders for this visit:  Non-recurrent bilateral inguinal hernia without obstruction or gangrene -     Ambulatory Referral to Interventional Radiology  Left side remains asymptomatic and nonpalpable on exam.  I do recommend repair of the right side given ongoing symptoms of pain and protrusion.  Increasing in size. We discussed the relevant anatomy and we discussed options for repair.  I recommend an open approach and went over the technique of the procedure.  Discussed risks of bleeding, infection, pain, scarring, injury to structures in the area including nerves, blood vessels, bowel, or bladder; risk of chronic pain, hernia recurrence, risk of seroma or hematoma, urinary retention, and risks of general anesthesia including cardiovascular, pulmonary, and thromboembolic complications.  We discussed typical postop recovery, timeline, and activity limitations.  We also discussed the option of ongoing observation, with high rate of ultimately returning for surgery and risk of increasing size/symptoms from the hernia as well as incarceration/strangulation and went over symptoms that should prompt the patient to seek emergency treatment.  Questions were welcomed and answered to the patient's satisfaction. Patient wishes to proceed with scheduling, anticipate early July.   - Pulmonology  clearance-she will see Dr. Bertrum Brodie at the end of this month - Will make referral for IR eval for preoperative IV access and coordinate this for day of surgery.  While they were able to establish IV access for her endoscopy last fall, would not want to risk putting her through what she went through when we attempted surgery a few years ago.     Aldon Hung MD FACS

## 2024-05-19 NOTE — Patient Instructions (Signed)
 SURGICAL WAITING ROOM VISITATION Patients having surgery or a procedure may have no more than 2 support people in the waiting area - these visitors may rotate in the visitor waiting room.   If the patient needs to stay at the hospital during part of their recovery, the visitor guidelines for inpatient rooms apply.  PRE-OP VISITATION  Pre-op nurse will coordinate an appropriate time for 1 support person to accompany the patient in pre-op.  This support person may not rotate.  This visitor will be contacted when the time is appropriate for the visitor to come back in the pre-op area.  Please refer to the [facility] website for the visitor guidelines for Inpatients (after your surgery is over and you are in a regular room).  You are not required to quarantine at this time prior to your surgery. However, you must do this: Hand Hygiene often Do NOT share personal items Notify your provider if you are in close contact with someone who has COVID or you develop fever 100.4 or greater, new onset of sneezing, cough, sore throat, shortness of breath or body aches.  If you test positive for Covid or have been in contact with anyone that has tested positive in the last 10 days please notify you surgeon.    Your procedure is scheduled on:  Friday  May 22, 2024  Report to [facility] Main Entrance: [address] entrance where the [employer] is available.   Report to admitting at:  11:15  ????   AM  Call this number if you have any questions or problems the morning of surgery [phone]  DO NOT EAT OR DRINK ANYTHING AFTER MIDNIGHT THE NIGHT PRIOR TO YOUR SURGERY / PROCEDURE.   FOLLOW  ANY ADDITIONAL PRE OP INSTRUCTIONS YOU RECEIVED FROM YOUR SURGEON'S OFFICE!!!   Oral Hygiene is also important to reduce your risk of infection.        Remember - BRUSH YOUR TEETH THE MORNING OF SURGERY WITH YOUR REGULAR TOOTHPASTE  Do NOT smoke after Midnight the night before surgery.  STOP TAKING all  Vitamins, Herbs and supplements 1 week before your surgery.   Take ONLY these medicines the morning of surgery with A SIP OF WATER: Pantoprazole , famotidine, tylenol , and you may use your Azelastine  Nasal spray if needed.   If You have been diagnosed with Sleep Apnea - Bring CPAP mask and tubing day of surgery. We will provide you with a CPAP machine on the day of your surgery.                   You may not have any metal on your body including hair pins, jewelry, and body piercing  Do not wear make-up, lotions, powders, perfumes , or deodorant  Do not wear nail polish including gel and S&S, artificial / acrylic nails, or any other type of covering on natural nails including finger and toenails. If you have artificial nails, gel coating, etc., that needs to be removed by a nail salon, Please have this removed prior to surgery. Not doing so may mean that your surgery could be cancelled or delayed if the Surgeon or anesthesia staff feels like they are unable to monitor you safely.   Do not shave 48 hours prior to surgery to avoid nicks in your skin which may contribute to postoperative infections.   Contacts, Hearing Aids, dentures or bridgework may not be worn into surgery. DENTURES WILL BE REMOVED PRIOR TO SURGERY PLEASE DO NOT APPLY "Poly grip"  OR ADHESIVES!!!  You may bring a small overnight bag with you on the day of surgery, only pack items that are not valuable. [facility] IS NOT RESPONSIBLE   FOR VALUABLES THAT ARE LOST OR STOLEN.   Patients discharged on the day of surgery will not be allowed to drive home.  Someone NEEDS to stay with you for the first 24 hours after anesthesia.  Do not bring your home medications to the hospital. The Pharmacy will dispense medications listed on your medication list to you during your admission in the Hospital.  Special Instructions: Bring a copy of your healthcare power of attorney and living will documents the day of surgery, if you wish to have them  scanned into your [facility] Medical Records- EPIC  Please read over the following fact sheets you were given: IF YOU HAVE QUESTIONS ABOUT YOUR PRE-OP INSTRUCTIONS, PLEASE CALL [phone].   [facility] - Preparing for Surgery Before surgery, you can play an important role.  Because skin is not sterile, your skin needs to be as free of germs as possible.  You can reduce the number of germs on your skin by washing with CHG (chlorahexidine gluconate) soap before surgery.  CHG is an antiseptic cleaner which kills germs and bonds with the skin to continue killing germs even after washing. Please DO NOT use if you have an allergy to CHG or antibacterial soaps.  If your skin becomes reddened/irritated stop using the CHG and inform your nurse when you arrive at Short Stay. Do not shave (including legs and underarms) for at least 48 hours prior to the first CHG shower.  You may shave your face/neck.  Please follow these instructions carefully:  1.  Shower with CHG Soap the night before surgery and the  morning of surgery.  2.  If you choose to wash your hair, wash your hair first as usual with your normal  shampoo.  3.  After you shampoo, rinse your hair and body thoroughly to remove the shampoo.                             4.  Use CHG as you would any other liquid soap.  You can apply chg directly to the skin and wash.  Gently with a scrungie or clean washcloth.  5.  Apply the CHG Soap to your body ONLY FROM THE NECK DOWN.   Do not use on face/ open                           Wound or open sores. Avoid contact with eyes, ears mouth and genitals (private parts).                       Wash face,  Genitals (private parts) with your normal soap.             6.  Wash thoroughly, paying special attention to the area where your  surgery  will be performed.  7.  Thoroughly rinse your body with warm water from the neck down.  8.  DO NOT shower/wash with your normal soap after using and rinsing off the CHG  Soap.            9.  Pat yourself dry with a clean towel.            10.  Wear clean pajamas.  11.  Place clean sheets on your bed the night of your first shower and do not  sleep with pets.  ON THE DAY OF SURGERY : Do not apply any lotions/deodorants the morning of surgery.  Please wear clean clothes to the hospital/surgery center.    FAILURE TO FOLLOW THESE INSTRUCTIONS MAY RESULT IN THE CANCELLATION OF YOUR SURGERY  PATIENT SIGNATURE_________________________________  NURSE SIGNATURE__________________________________  ________________________________________________________________________

## 2024-05-19 NOTE — Progress Notes (Signed)
 COVID Vaccine received:  []  No [x]  Yes Date of any COVID positive Test in last 90 days:  PCP - Lorella Roles, MD  Cardiologist - Carson Clara, MD (LOV 07-02-22 for CP, DOE Palps, abn ECHO)  ID -  Zulma Hitt, MD  Pulmonology- Maire Scot, MD   Chest x-ray - CT Chest  10-02-2023 Epic EKG -  10-2022 Epic  will repeat Stress Test -  ECHO - 05-09-2022 outside test scanned to Epic, no cardio follow up Cardiac Cath -  CT Coronary Calcium  score: 438 on 07-23-2022  Zio Monitor- 04-20-2022  Epic  Bowel Prep - [x]  No  []   Yes ______  Pacemaker / ICD device [x]  No []  Yes   Spinal Cord Stimulator:[x]  No []  Yes       History of Sleep Apnea? [x]  No []  Yes   CPAP used?- []  No []  Yes    Does the patient monitor blood sugar?   [x]  N/A   []  No []  Yes  Patient has: [x]  NO Hx DM   []  Pre-DM   []  DM1  []   DM2  Blood Thinner / Instructions:none Aspirin Instructions:  ASA 81 mg  ??  ERAS Protocol Ordered: [x]  No  []  Yes Patient is to be NPO after: MN  Prior  Dental hx: []  Dentures:  []  N/A      []  Bridge or Partial:                   []  Loose or Damaged teeth:   Comments: Per Dr. Beatris Lincoln note:  Will make referral for IR eval for preoperative IV access and coordinate this for day of surgery.  While they were able to establish IV access for her endoscopy last fall, would not want to risk putting her through what she went through when we attempted surgery a few years ago.    Activity level: Able to walk up 2 flights of stairs without becoming significantly short of breath or having chest pain?  []  No   []    Yes   Anesthesia review: HIV- no meds since 1990s, COPD (ILD), Hx HepC- partial tx w/ Harvoni  in 2015 (only able to do 1/2 tx- couldn't tolerate),   Patient denies shortness of breath, fever, cough and chest pain at PAT appointment.  Patient verbalized understanding and agreement to the Pre-Surgical Instructions that were given to them at this PAT appointment. Patient was also  educated of the need to review these PAT instructions again prior to her surgery.I reviewed the appropriate phone numbers to call if they have any and questions or concerns.

## 2024-05-20 ENCOUNTER — Encounter (HOSPITAL_COMMUNITY): Payer: Self-pay

## 2024-05-20 ENCOUNTER — Encounter (HOSPITAL_COMMUNITY)
Admission: RE | Admit: 2024-05-20 | Discharge: 2024-05-20 | Disposition: A | Discharge status: Home / Self Care | Source: Ambulatory Visit | Attending: Surgery | Admitting: Surgery

## 2024-05-20 DIAGNOSIS — Z01818 Encounter for other preprocedural examination: Secondary | ICD-10-CM

## 2024-05-20 DIAGNOSIS — K769 Liver disease, unspecified: Secondary | ICD-10-CM

## 2024-05-20 DIAGNOSIS — Z21 Asymptomatic human immunodeficiency virus [HIV] infection status: Secondary | ICD-10-CM

## 2024-05-20 DIAGNOSIS — I10 Essential (primary) hypertension: Secondary | ICD-10-CM

## 2024-05-20 DIAGNOSIS — Z8619 Personal history of other infectious and parasitic diseases: Secondary | ICD-10-CM

## 2024-05-20 NOTE — Progress Notes (Addendum)
 Patient came in today but she and I spoke to Gordon at Dr. Beatris Lincoln office about RS her surgery from 05-22-24 to sometime the first of July as per Dr. Beatris Lincoln H&P notes. The patient will still go to her appts for PFTs and Dr. Bertrum Brodie on 06-04-24 to get pulmonary clearance for her hernia surgery. She has transportation problems and is not able to come on Friday 6-13 for surgery.   No charges were done for today's visit per Sigmund Drew, RN   Patient is a difficult stick and previously this surgery had to be aborted d/t being unable to get IV access. When she is RS'd for surgery, the plan will be to have her come in early for IR to do her IV access and then she will be taken to the OR for her hernia repair. Alphonse Asal will get this arranged and give the patient plenty of notice on her new appt times / dates, so that Mrs. Cislo can get transportation arranged. Patient voiced understanding and approval of these changes.

## 2024-05-22 ENCOUNTER — Telehealth: Payer: Self-pay

## 2024-05-22 NOTE — Telephone Encounter (Signed)
 Received surgical assessment from [facility] for hernia repair on 06/15/2024. Pending appt 06/04/2024

## 2024-05-26 ENCOUNTER — Telehealth (HOSPITAL_COMMUNITY): Payer: Self-pay

## 2024-05-26 NOTE — Telephone Encounter (Signed)
 Called [facility] regarding order for IV start in IR prior to hernia surgery on 7/7. The order was sent to [facility] but surgery will be at [facility]. I reached out to [facility] and the only slot available the morning of surgery is at 8am. That is too early for the patient since she doesn't have to be in admitting until 1130. I explained this to the triage RN and she said that they have worked it out to try and have the IV team available that morning. We will hold off on this for now and they will give IR a call if something changes. AB

## 2024-05-28 ENCOUNTER — Encounter: Payer: Self-pay | Admitting: Gastroenterology

## 2024-05-28 ENCOUNTER — Ambulatory Visit: Admitting: Gastroenterology

## 2024-05-28 VITALS — BP 116/56 | HR 55 | Ht 60.0 in | Wt 123.1 lb

## 2024-05-28 DIAGNOSIS — R634 Abnormal weight loss: Secondary | ICD-10-CM | POA: Insufficient documentation

## 2024-05-28 DIAGNOSIS — K2100 Gastro-esophageal reflux disease with esophagitis, without bleeding: Secondary | ICD-10-CM

## 2024-05-28 MED ORDER — PANTOPRAZOLE SODIUM 40 MG PO TBEC: 40.0000 mg | DELAYED_RELEASE_TABLET | Freq: Two times a day (BID) | ORAL | 3 refills | Status: DC

## 2024-05-28 NOTE — Progress Notes (Signed)
 05/28/2024 Alonnah Lampkins [id number] [birth year]   HISTORY OF PRESENT ILLNESS: This is a 78 year old female who is a patient of Dr. Allean Aran.  She has past medical history of HIV, not currently on treatment, hep C status post SVR after treatment with Harvoni , COPD.  She has been seen here most recently, in August 2024 for complaints of reflux.  She was scheduled for an EGD as below.  She is here again today now with ongoing complaints of reflux and inability to eat much.  She is concerned because she has lost about 9 pounds since April.  She says that when she gets anxious she feels like she cannot eat as well.  She says that she has been eating very bland.  Tells me she can eat sauces, etc., but then also tells me she can eat cheese and several other items.  Cannot eat fried/greasy food.  Reports feeling reflux sensation.  She does not vomit and does not have any abdominal pain.  She took the pantoprazole  40 mg twice daily for a month after her EGD last year as recommended and then decrease to once daily.  She is on famotidine as well.  EGD 08/2023:  - Z- line regular, 36 cm from the incisors. - No gross lesions in the entire esophagus. - 5 cm hiatal hernia. - Hemorrhagic gastropathy. Biopsied. - Gastritis. Biopsied. - Normal examined duodenum.   1. Surgical [P], gastric antrum bx :       -  ANTRAL AND OXYNTIC MUCOSA WITH FEATURES OF BOTH MODERATE CHRONIC INACTIVE       GASTRITIS AND CHEMICAL/REACTIVE CHANGE.       -  AN IMMUNOHISTOCHEMICAL STAIN FOR HELICOBACTER PYLORI ORGANISMS IS NEGATIVE.        2. Surgical [P], random gastric bx :       -  ANTRAL/OXYNTIC MUCOSA WITH MODERATE CHRONIC INACTIVE GASTRITIS AND FOCAL       INTESTINAL METAPLASIA SUGGESTIVE OF ATROPHY.       -  AN IMMUNOHISTOCHEMICAL STAIN FOR HELICOBACTER PYLORI ORGANISMS IS NEGATIVE.   Colonoscopy July 2018 showed nonbleeding internal hemorrhoids only with repeat recommended in 10 years.   Past Medical History:   Diagnosis Date   Acid reflux    Anemia 2017   COPD (chronic obstructive pulmonary disease) (HCC) 03/19/2023   Difficult intravenous access    Hepatitis C 07/2014   took harvani tx    History of blood transfusion 2017   2 units   HIV infection (HCC) since 1993   Hypertension    Internal hemorrhoid    Right inguinal hernia    Past Surgical History:  Procedure Laterality Date   BREAST BIOPSY Right 07/26/2008   CATARACT EXTRACTION Right 2016   CATARACT EXTRACTION Left 06/2019   COLONOSCOPY WITH PROPOFOL  N/A 06/11/2017   Procedure: COLONOSCOPY WITH PROPOFOL ;  Surgeon: Sergio Dandy, MD;  Location: [facility];  Service: Endoscopy;  Laterality: N/A;  PT IS KNOWN HEART STICK. WILL PROBABLY REQUIRE ULTRASOUND OR ANESTHESIA FOR IV STICK.   ESOPHAGOGASTRODUODENOSCOPY (EGD) WITH PROPOFOL  N/A 06/11/2017   Procedure: ESOPHAGOGASTRODUODENOSCOPY (EGD) WITH PROPOFOL ;  Surgeon: Sergio Dandy, MD;  Location: [facility];  Service: Endoscopy;  Laterality: N/A;    reports that she quit smoking about 15 years ago. Her smoking use included cigarettes. She started smoking about 60 years ago. She has a 33.8 pack-year smoking history. She has been exposed to tobacco smoke. She has never used smokeless tobacco. She reports that she does not  drink alcohol and does not use drugs. family history includes Alzheimer's disease in her mother; Cancer in her father; Glaucoma in her mother; Hypertension in her mother; Seizures in her son. Allergies  Allergen Reactions   Iodinated Contrast Media Hives and Itching   Lisinopril  Itching and Cough   Other Hives and Itching     Seafood crustaceans  itchy throat      Outpatient Encounter Medications as of 05/28/2024  Medication Sig   acetaminophen  (TYLENOL ) 325 MG tablet Take 325 mg by mouth daily as needed for moderate pain or headache.   amLODipine  (NORVASC ) 10 MG tablet Take 10 mg by mouth daily.   Ascorbic Acid (VITAMIN C PO) Take 1 tablet by mouth  daily.   aspirin 81 MG chewable tablet Chew 81 mg by mouth daily.   azaTHIOprine  (IMURAN ) 50 MG tablet Take 50 mg by mouth daily.   azelastine  (ASTELIN ) 0.1 % nasal spray Place 2 sprays into both nostrils 2 (two) times daily. Use in each nostril as directed   BIOTIN PO Take 500 mcg by mouth daily.   calcium  carbonate (OSCAL) 1500 (600 Ca) MG TABS tablet Take 1,500 mg by mouth daily with breakfast.   Cyanocobalamin (VITAMIN B12 PO) Take 1 tablet by mouth daily.   docusate sodium (COLACE) 100 MG capsule Take 100 mg by mouth as needed for mild constipation.   famotidine (PEPCID) 40 MG tablet Take 40 mg by mouth 2 (two) times daily.   ferrous sulfate  325 (65 FE) MG tablet Take 1 tablet (325 mg total) by mouth daily.   fluticasone  (FLONASE ) 50 MCG/ACT nasal spray Place 2 sprays into both nostrils daily as needed for allergies or rhinitis.   folic acid  (FOLVITE ) 1 MG tablet Take 1 mg by mouth daily.   hydrocortisone  (ANUSOL -HC) 25 MG suppository Place 25 mg rectally daily as needed for hemorrhoids.   Misc Natural Products (NEURIVA PO) Take 1 capsule by mouth daily.   pantoprazole  (PROTONIX ) 40 MG tablet TAKE 1 TABLET BY MOUTH TWICE  DAILY   polyethylene glycol (MIRALAX  / GLYCOLAX ) packet Take 17 g by mouth every other day.   rosuvastatin  (CRESTOR ) 20 MG tablet Take 20 mg by mouth in the morning.   SPIRIVA  RESPIMAT 1.25 MCG/ACT AERS USE 2 INHALATIONS BY MOUTH DAILY   valACYclovir (VALTREX) 1000 MG tablet Take 1,000 mg by mouth daily.    Zinc 50 MG TABS Take 50 mg by mouth daily.   benzonatate  (TESSALON ) 100 MG capsule Take 1 capsule (100 mg total) by mouth every 8 (eight) hours. (Patient not taking: Reported on 05/28/2024)   doxycycline (MONODOX) 100 MG capsule Take 100 mg by mouth 2 (two) times daily. (Patient not taking: Reported on 05/28/2024)   OXYGEN Inhale 1.5 L into the lungs at bedtime. (Patient not taking: Reported on 05/28/2024)   predniSONE  (DELTASONE ) 20 MG tablet Take 20 mg by mouth daily.  (Patient not taking: Reported on 05/28/2024)   No facility-administered encounter medications on file as of 05/28/2024.    REVIEW OF SYSTEMS  : All other systems reviewed and negative except where noted in the History of Present Illness.   PHYSICAL EXAM: BP (!) 116/56 (BP Location: Right Arm, Patient Position: Sitting, Cuff Size: Normal)   Pulse (!) 55   Ht 5' (1.524 m)   Wt 123 lb 2 oz (55.8 kg)   BMI 24.05 kg/m  General: Well developed AA female in no acute distress Head: Normocephalic and atraumatic Eyes:  Sclerae anicteric, conjunctiva pink. Ears: Normal auditory  acuity Lungs: Clear throughout to auscultation; no W/R/R. Heart: Slightly bradycardic but regular rhythm; no W/R/R. Abdomen: Soft, non-distended.  BS present.  Non-tender. Musculoskeletal: Symmetrical with no gross deformities  Skin: No lesions on visible extremities Extremities: No edema  Neurological: Alert oriented x 4, grossly non-focal Psychological:  Alert and cooperative. Normal mood and affect  ASSESSMENT AND PLAN: *GERD/dyspepsia: I think that anxiety likely plays into some of her symptoms as well as she says she cannot eat when she is upset or anxious.  Will increase her pantoprazole  back to twice daily for the next couple of months.  Continue her famotidine for now as well.  Was given reflux and low fat dietary literature. *Weight loss: She is concerned about weight loss.  Down about 9 pounds since April.  -Will have her follow-up in 2 to 3 months.  If still symptomatic and losing weight despite change in medication then we will consider CT scan of the abdomen and pelvis with contrast.   CC:  Paliwal, Himanshu, MD

## 2024-05-28 NOTE — Patient Instructions (Signed)
 We have sent the following medications to your pharmacy for you to pick up at your convenience: Pantoprazole  40 mg twice daily 30-60 minutes before breakfast and dinner.   _______________________________________________________  If your blood pressure at your visit was 140/90 or greater, please contact your primary care physician to follow up on this.  _______________________________________________________  If you are age 76 or older, your body mass index should be between 23-30. Your Body mass index is 24.05 kg/m. If this is out of the aforementioned range listed, please consider follow up with your Primary Care Provider.  If you are age 44 or younger, your body mass index should be between 19-25. Your Body mass index is 24.05 kg/m. If this is out of the aformentioned range listed, please consider follow up with your Primary Care Provider.   ________________________________________________________  The [facility] providers would like to encourage you to use MYCHART to communicate with providers for non-urgent requests or questions.  Due to long hold times on the telephone, sending your provider a message by [facility] may be a faster and more efficient way to get a response.  Please allow 48 business hours for a response.  Please remember that this is for non-urgent requests.  _______________________________________________________

## 2024-06-03 ENCOUNTER — Ambulatory Visit: Admitting: Internal Medicine

## 2024-06-03 DIAGNOSIS — J439 Emphysema, unspecified: Secondary | ICD-10-CM

## 2024-06-03 LAB — PULMONARY FUNCTION TEST
DL/VA % pred: 56 %
DL/VA: 2.38 ml/min/mmHg/L
DLCO unc % pred: 53 %
DLCO unc: 8.91 ml/min/mmHg
FEF 25-75 Pre: 1.24 L/s
FEF2575-%Pred-Pre: 89 %
FEV1-%Pred-Pre: 92 %
FEV1-Pre: 1.58 L
FEV1FVC-%Pred-Pre: 91 %
FEV6-%Pred-Pre: 104 %
FEV6-Pre: 2.26 L
FEV6FVC-%Pred-Pre: 105 %
FVC-%Pred-Pre: 100 %
FVC-Pre: 2.3 L
Pre FEV1/FVC ratio: 69 %
Pre FEV6/FVC Ratio: 100 %

## 2024-06-03 NOTE — Progress Notes (Signed)
Spiro/DLCO performed today. 

## 2024-06-03 NOTE — Patient Instructions (Signed)
Spiro/DLCO performed today. 

## 2024-06-04 ENCOUNTER — Ambulatory Visit (INDEPENDENT_AMBULATORY_CARE_PROVIDER_SITE_OTHER): Admitting: Internal Medicine

## 2024-06-04 ENCOUNTER — Encounter: Payer: Self-pay | Admitting: Internal Medicine

## 2024-06-04 VITALS — BP 124/68 | HR 54 | Ht 60.0 in | Wt 123.0 lb

## 2024-06-04 DIAGNOSIS — J439 Emphysema, unspecified: Secondary | ICD-10-CM | POA: Diagnosis not present

## 2024-06-04 DIAGNOSIS — R918 Other nonspecific abnormal finding of lung field: Secondary | ICD-10-CM

## 2024-06-04 DIAGNOSIS — R768 Other specified abnormal immunological findings in serum: Secondary | ICD-10-CM | POA: Diagnosis not present

## 2024-06-04 DIAGNOSIS — Z87891 Personal history of nicotine dependence: Secondary | ICD-10-CM | POA: Diagnosis not present

## 2024-06-04 DIAGNOSIS — Z01811 Encounter for preprocedural respiratory examination: Secondary | ICD-10-CM

## 2024-06-04 NOTE — Progress Notes (Signed)
 OV 08/16/2022  Subjective:  Patient ID: Tina Olson, female , DOB: May 02, 1946 , age 78 y.o. , MRN: [id number] , ADDRESS: [address] [place] [id number] PCP Tina Kingfisher, MD Patient Care Team: Tina Kingfisher, MD as PCP - General (Family Medicine) Tina Reyes BROCKS, MD as PCP - Infectious Diseases (Infectious Diseases)  This Provider for this visit: Treatment Team:  Attending Provider: Geronimo Amel, MD    08/16/2022 -   Chief Complaint  Patient presents with   Consult    Pt is here due to recent CT coronary results.  Pt states that she does have some SOB especially if she goes to the mailbox to bend down to pick something up.     HPI   Integrated Comprehensive ILD Questionnaire Tina Olson 78 y.o. -78 y.o. 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 at. Patient has hx of pseudophakia both eyes s/p PCIOL OU with Dr. Octavia and inflammation in the left eye ever since cataract surgery on 06/22/19. She has hx of HIV since 1993 and hx of Syphilis with treatment over 10 year ago. Currently on Valtrex 1g daily and Azathioprine  50 mg daily. Sees Dr Tina Olson t [facility]   Has HIV Hx of HIV since 1993 and hx of Syphilis over 10 years ago  -sees Dr Tina  She is a former smoker who undergoes lung cancer screening as well  She actually has interstitial lung disease mentioned on CT scans in 2020 but she has not been aware of this until she had a cardiac CT in 2023 August.  Therefore she has been referred here.  She tells me that she has had insidious onset of shortness of breath for at least a year and slowly getting worse.  Made worse with exertion and stooping forward.  Improved by rest but there is also variability to it and sometimes it is not there.        Past Medical History :  Is HIV positive and follows with Dr. Eben Has ophthalmologic issues and is immunosuppressed and is on Imuran  for at  least a few years.   Positive for longstanding history of acid reflux disease  Denies any history of asthma but somebody apparently told her she has COPD but she is not sure Denies any connective tissue disease or vasculitis Denies any diabetes or stroke or hepatitis or tuberculosis. Denies any kidney disease She has had the COVID-vaccine but has not had COVID.  ROS:  Positive for shortness of breath with exertion.  Not sure she has cough  FAMILY HISTORY of LUNG DISEASE:  -Her biologic second cousin Gaynell Pettyy has IPF and follows with Dr. Theophilus. With family history of asthma  PERSONAL EXPOSURE HISTORY:  She used to smoke at least 20 cigarettes a day.  She quit smoking sometime between 2018 2010.  No marijuana smoking no vaping no cocaine use.  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  She did use it quite a lot  HOME  EXPOSURE and HOBBY DETAILS :  Single-family home [place] the last 11 years.  Detail organic antigen exposure history in the house is negative  OCCUPATIONAL HISTORY (122 questions) : She grew up in [place] in her childhood although she was born in [place].  Then she moved to [place] for high school and she is lived here since.  She used to work in [employer]	[place] and  retired in 2010.  During this time was exposed to steam iron and cleaning equipment but no known organic or inorganic antigen exposure.  PULMONARY TOXICITY HISTORY (27 items):  -Is on Imuran  for ophthalmology issues.  INVESTIGATIONS:    CT Chest data  No results found.    PFT     CT chest July 2021 -personally visualized.  Narrative & Impression  CLINICAL DATA:  78 year old asymptomatic female former smoker presents for follow-up of pulmonary nodules.   EXAM: CT CHEST WITHOUT CONTRAST   TECHNIQUE: Multidetector CT imaging of the chest was performed following the standard protocol without intravenous contrast. High resolution imaging of  the lungs, as well as inspiratory and expiratory imaging, was performed.   COMPARISON:  11/19/2019 CT abdomen/pelvis. 04/04/2020 chest radiograph.   FINDINGS: Cardiovascular: Normal heart size. No significant pericardial effusion/thickening. Three-vessel coronary atherosclerosis. Atherosclerotic nonaneurysmal thoracic aorta. Top-normal caliber main pulmonary artery (3.1 cm diameter).   Mediastinum/Nodes: No discrete thyroid  nodules. Unremarkable esophagus. No pathologically enlarged axillary, mediastinal or hilar lymph nodes, noting limited sensitivity for the detection of hilar adenopathy on this noncontrast study.   Lungs/Pleura: No pneumothorax. No pleural effusion. Moderate centrilobular and paraseptal emphysema. No acute consolidative airspace disease or lung masses. Previously visualized clustered nodular opacities in the peripheral basilar right lower lobe on 12/08/2019 CT abdomen study have resolved. No significant pulmonary nodules. No significant lobular air trapping or evidence of tracheobronchomalacia on the expiration sequence. Mild patchy subpleural reticulation and ground-glass opacity with subpleural lines in the lower lungs bilaterally without significant regions of traction bronchiectasis, architectural distortion or frank honeycombing. These findings of not substantially changed at the lung bases since 12/08/2019 CT abdomen study.   Upper abdomen: No acute abnormality.   Musculoskeletal: No aggressive appearing focal osseous lesions. Mild thoracic spondylosis.   IMPRESSION: 1. No significant pulmonary nodules. Previously visualized clustered nodular opacities in the peripheral basilar right lower lobe on 12/08/2019 CT abdomen study have resolved. Olson. Mild patchy subpleural reticulation and ground-glass opacity with subpleural lines in the lower lungs bilaterally without bronchiectasis or honeycombing, unchanged since 12/08/2019 CT abdomen study. Mild  interstitial lung disease such as NSIP or early UIP not excluded. Findings are indeterminate for UIP per consensus guidelines: Diagnosis of Idiopathic Pulmonary Fibrosis: An Official ATS/ERS/JRS/ALAT Clinical Practice Guideline. Am JINNY Honey Crit Care Med Vol 198, Iss 5, [phone], Aug 10 2017. 3. Three-vessel coronary atherosclerosis. 4. Aortic Atherosclerosis (ICD10-I70.0) and Emphysema (ICD10-J43.9).     Electronically Signed   By: Selinda DELENA Blue M.D.   On: 06/16/2020 11:54     09/06/2022: Today-acute Patient presents today for acute visit.  She had contacted the office on 9/25 reported that she was having increased shortness of breath and fatigue.  Felt like her breathing was limiting what all she could do.  She was advised to go to the ED or come in for acute visit.  Today, she reports feeling relatively unchanged.  She cannot exactly identify when she started feeling worse after seeing Dr. Geronimo last.  Feels like it has been a few weeks.  She tells me that she just feels like she has progressively declined over the past 6 months to a year, and then within the past few weeks has noticed that she cannot walk as far without having to stop to rest.  She has an occasional dry cough.  Does feel like she has some chest congestion but cannot produce any sputum.  No significant wheezing.  She denies any fevers, night sweats, chills, hemoptysis, calf  pain or swelling.  She has been told in the past that she has COPD.  She has never been on any inhalers for this. Her autoimmune/ILD panel recently completed was significant for positive scleroderma antibody, ANA and rheumatoid factor.  She also had elevated ESR.  She denies any significant joint pain.  Occasionally has some mild pain in her right hip, which she reports is sciatica.  Denies any joint pain or swelling.  She occasionally has some numbness in her hands; does not feel like they are tight.  No significant skin discoloration or changes.  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  She was told by her ophthalmologist that it could possibly be related to an underlying autoimmune disease but she was never evaluated by rheumatology for this.  She is currently on Imuran    OV 10/09/2022  Subjective:  Patient ID: Tina Olson, female , DOB: 11/16/1946 , age 71 y.o. , MRN: [id number] , ADDRESS: [address] [place] [id number] PCP Tina Kingfisher, MD Patient Care Team: Tina Kingfisher, MD as PCP - General (Family Medicine) Tina Reyes BROCKS, MD as PCP - Infectious Diseases (Infectious Diseases)  This Provider for this visit: Treatment Team:  Attending Provider: Geronimo Amel, MD    10/09/2022 -   Chief Complaint  Patient presents with   Office Visit    PFT today PT on 1.5L O2 at night, breathing better during the day    HPI Tina Olson y.o. -returns for follow-up.  Here to go over test results.  Overall she feels stable.  I was fairly convinced based on low-dose CT scan of the chest that she had interstitial lung disease but she had high-resolution CT chest and thoracic radiologist Dr. Jeremy says she has emphysema and some very mild bronchiectasis.  There is no ILD.  Her pulmonary function test only shows slight isolated reduction in diffusion capacity but is otherwise normal.  I did share this news with her and she is quite excited about it.  However serology shows positivity for scleroderma antibody [she admits to thickening of the skin is on the dorsum of her palm], mild ANA positivity and mild rheumatoid factor positivity.  She is not aware of these from before.  She says she has a rheumatology appointment pending.  Review of the records indicate appointment Dr. Jeannetta in January 2024.  Of note: Because of emphysema last visit Katie Cobb put her on Spiriva  higher dose which she is supposed to take only Olson puffs once daily but she was taking Olson puffs Olson times daily and a  week after this even though she started feeling better from a shortness of breath perspective started have tingling and call 911 and stop.  She was not aware that she only needed to take it once daily.  After hearing that she is willing to take the low-dose only once daily and then reassess.     In terms of HIV she has upcoming appointment with Dr. Eben in December 2023.  Last CD4 count was in March 2023  She has SVTs and she seen Dr. Jerone in July 2023     OV 03/19/2023  Subjective:  Patient ID: Tina Olson, female , DOB: Jan 09, 1946 , age Olson y.o. , MRN: [id number] , ADDRESS: [address] [place] [address] PCP Tina Kingfisher, MD Patient Care Team: Tina Kingfisher, MD as PCP - General (Family Medicine) Tina Reyes BROCKS, MD as PCP - Infectious Diseases (Infectious Diseases)  This Provider for this visit: Treatment Team:  Attending Provider: Geronimo Amel, MD    03/19/2023 -   Chief Complaint  Patient presents with   Follow-up    Doing well.  Spiriva  helping with SOB.  Needs refill on Spiriva     HPI Tina Olson 78 y.o. -returns for follow-up.  From a shortness of breath perspective she is stable.  She says Ranell is actually helping her.  In fact symptom scores have improved compared to October 2023.  She says she is doing well.  Review of the records indicate January 2024 she saw Dr. Lonni Ester rheumatology who is reassured her about the absence of systemic connective tissue disease.  He felt that the autoimmune antibodies could be false positive potentially.  She is also seen Dr. Reyes Fenton of infectious diseases 02/26/2023 CD4 counts were normal upon my review.  Mid March 2024 she also saw Dr. Maree her ophthalmologist and has been reassured she did indicate that she is getting some bladder scan and abdominal imaging coming up.  But otherwise she is well.  Today on exam I did hear right greater than left lower lobe crackles.  But her  symptom score is better and her walking desaturation test is stable./Normal.  Of note she uses her night oxygen.  She wants to know if she can go to [place]  for a few days without portable oxygen on night oxygen.  I reassured her that she could do that.  She also thinks that at 8 PM she needs to put her night oxygen on.  I did indicate to her she only needs to do it when she is going to bed and she is allowed to go to church etc. without oxygen.  OV 10/07/2023  Subjective:  Patient ID: Tina Olson, female , DOB: 1946-12-01 , age 85 y.o. , MRN: [id number] , ADDRESS: [address] [place] [address] PCP Tina Kingfisher, MD Patient Care Team: Tina Kingfisher, MD as PCP - General (Family Medicine) Fenton Reyes BROCKS, MD as PCP - Infectious Diseases (Infectious Diseases)  This Provider for this visit: Treatment Team:  Attending Provider: Geronimo Amel, MD    10/07/2023 -   Chief Complaint  Patient presents with   Pulmonary emphysema, unspecified emphysema type     HPI Tina Olson 78 y.o. -returns for follow-up.  Last seen 6 months ago.  Since then she says she has been more tired and short of breath.  No anemia issues.  She is had no other new medical problems no surgeries.  She did have endoscopy 09/06/2023 with Dr. Shila.  Apparently biopsies did not show any cancer.  She continues on Imuran  for her eye.  She had pulmonary function test and it is stable open with see below] exercise hypoxemia test is also stable.  She is not using her night oxygen and she wants to return.  She did have a high-resolution CT chest.  In the past the radiologist felt there was no ILD but when I looked at it there was groundglass opacities in the lower lobe.  However the official report is pending due to the shortage of radiologist.  I did indicate to her that our office will inform her of the CT reports but otherwise that she just see her in 6 months with repeat breathing  test.  She is up-to-date with the flu shot.  She is previous helping she also feels acid reflux therapy is helping.      OV 06/04/2024  Subjective:  Patient ID: Tina Olson, female , DOB: 07-28-1946 , age 74 y.o. , MRN: [id number] , ADDRESS: [address] [place] [address] PCP Tina Kingfisher, MD Patient Care Team: Tina Kingfisher, MD as PCP - General (Family Medicine) Tina Reyes BROCKS, MD as PCP - Infectious Diseases (Infectious Diseases)  This Provider for this visit: Treatment Team:  Attending Provider: Geronimo Amel, MD    06/04/2024 -   Chief Complaint  Patient presents with   Follow-up    PFT done 06/03/24. Breathing is doing well. She is scheduled to have hernia repair 06/15/24-needs risk assessment.       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  - Macular pucker oof left retinae  - hx of pseudophakia both eyes s/p PCIOL OU with Dr. Octavia and inflammation in the left eye ever since cataract surgery on 06/22/19.  - Currently on Valtrex 1g daily and Azathioprine  50 mg daily.   She has hx of HIV since 1993 andCD4 count 388 02/15/2022   hx of Syphilis with treatment over 10 year ago.   Has HIV Hx of HIV since 1993 and hx of Syphilis over 10 years ago  -sees Dr Tina  -CD4 353/5/24  - CD4 293 on/1/25  She is a former smoker > 30 ppd -  who undergoes lung cancer screening as well  Zio patch x2 weeks 05/04/2022 showed 37 episodes of SVT, longest lasting 17 beats with average rate 118 bpm, occasional PACs (1.4% of beats).  Echocardiogram 05/09/2022 showed EF 55 to 60%, mild LVH, normal diastolic function, normal RV function, no significant valvular disease  -Dr. 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 Lonni Ester early 2024 visit.   Concern for ILD but ILD ruled out October 2023  - interstitial lung disease mentioned on CT scans in 2020 but she has not been aware of this until she had a cardiac CT in 2023 August.  -   She tells me that she has had insidious onset of shortness of breath since 2022 and progressive   HPI Tina Olson 78 y.o. -returns for follow-up.  Last seen fall 2024.  Radiologist Dr. Jeremy felt at the time of last visit after the visit when we got the CT report that there was interstitial lung inflammation.  This was in variance with the October 2023 report.  In the interim from a shortness of breath perspective she feels stable.  She continues her Imuran  for her eye issues.  She continues to deal with acid reflux.  June 2025 saw GI and pantoprazole  was increased [external record reviewed.]  Chart review also shows a CD4 counts to be greater than 250.  She had pulmonary function test this visit and shows continued stability which is in variance with last chest CT report.  Her exercise sit/stand test is also normal.  She has been diagnosed with right inguinal hernia and she needs preop clearance.     SYMPTOM SCALE - ILD 08/16/2022 10/09/2022  03/19/2023    Current weight      O2 use ra ra ra   Shortness of Breath 0 -> 5 scale with 5 being worst (score 6 If unable to do)     At rest 0 Olson  0   Simple tasks - showers, clothes change, eating, shaving 0 3 1   Household (dishes, doing bed, laundry) 3 3 Olson.5   Shopping Olson 4 Olson.5   Walking level at own pace Olson 3 Olson    Walking up Stairs 3 4 Olson.5   Total (30-36) Dyspnea Score 10 18 10.5       Non-dyspnea symptoms (0-> 5 scale) 08/16/2022 10/09/2022  03/19/2023    How bad is your cough? 0 0 Olson   How bad is your fatigue Olson 4 Olson    How bad is nausea 0 Olson 0   How bad is vomiting?  0 0 0   How bad is diarrhea? 0 0 0   How bad is anxiety? 1 1 Olson    How bad is depression Olson 1 1.5   Any chronic pain - if so where and how bad 0 x  x          SIT STAND TEST - goal 15 times   06/04/2024    O2 used ra   PRobe - finter or forehead finger   Number sit and stand completed - goal 15 15   Time taken to complete 100   Resting Pulse Ox/HR/Dyspnea  100% and HR 53 and dspnea score of 1   Peak measures 61005 and HR 53 and dhspnea score of 1   Final Pulse Ox/HR 100% and 57/min and dyspnea of 1/10   Desaturated </= 88% no   Desaturated <= 3% points no   Got Tachycardic >/= 90/min no   Miscellaneous comments Normal test       CT Chest data from date:fall 2024*  - personally visualized and independently interpreted : yes - my findings are:  LL ggo +/- ILD - NOT consistent with UIP Narrative & Impression  CLINICAL DATA:  COPD exacerbation, emphysema.  HIV.   EXAM: CT CHEST WITHOUT CONTRAST   TECHNIQUE: Multidetector CT imaging of the chest was performed following the standard protocol without intravenous contrast. High resolution imaging of the lungs, as well as inspiratory and expiratory imaging, was performed.   RADIATION DOSE REDUCTION: This exam was performed according to the departmental dose-optimization program which includes automated exposure control, adjustment of the mA and/or kV according to patient size and/or use of iterative reconstruction technique.   COMPARISON:  07/22/2023, 09/26/2022, 07/19/2022, 06/15/2020.   FINDINGS: Cardiovascular: Atherosclerotic calcification of the aorta, aortic valve and coronary arteries. Heart is at the upper limits of normal in size. No pericardial effusion.   Mediastinum/Nodes: No pathologically enlarged mediastinal or axillary lymph nodes. Hilar regions are difficult to definitively evaluate without IV contrast. Esophagus is grossly unremarkable.   Lungs/Pleura: Centrilobular and paraseptal emphysema. Basilar central and peripheral coarsened ground-glass with scattered septal thickening and subpleural reticulation. Mild cylindrical bronchiectasis. No  honeycombing. Findings are progressive from 07/22/2023. No pleural fluid. Airway is unremarkable. Respiratory motion on expiratory phase imaging is limiting. Favor mild air trapping.   Upper Abdomen: Small low-attenuation lesion in the right kidney. No specific follow-up necessary. Visualized portions of the liver, gallbladder, adrenal glands, kidneys, spleen, pancreas, stomach and bowel are otherwise grossly unremarkable. No upper abdominal adenopathy.   Musculoskeletal: Minimal degenerative change in the spine.   IMPRESSION: 1. Worsening mid and lower lung zone predominant coarsened parenchymal and subpleural ground-glass with scattered septal thickening and subpleural reticular densities. Cylindrical bronchiectasis. Suspect air trapping. 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 Findings are suggestive of an alternative diagnosis (not UIP) per consensus guidelines: Diagnosis of Idiopathic  Pulmonary Fibrosis: An Official ATS/ERS/JRS/ALAT Clinical Practice Guideline. Am JINNY Honey Crit Care Med Vol 198, Iss 5, [phone], Aug 10 2017. Olson. Aortic atherosclerosis (ICD10-I70.0). Coronary artery calcification. 3.  Emphysema (ICD10-J43.9).     Electronically Signed   By: Newell Eke M.D.   On: 10/18/2023 12:14      PFT     Latest Ref Rng & Units 06/03/2024    8:01 AM 10/07/2023    Olson:55 PM 10/09/2022    8:35 AM  PFT Results  FVC-Pre L Olson.30  P Olson.31  Olson.25   FVC-Predicted Pre % 100  P 98  94   FVC-Post L   Olson.46   FVC-Predicted Post %   103   Pre FEV1/FVC % % 69  P 80  66   Post FEV1/FCV % %   81   FEV1-Pre L 1.58  P 1.85  1.48   FEV1-Predicted Pre % 92  P 106  83   FEV1-Post L   1.99   DLCO uncorrected ml/min/mmHg 8.91  P 8.53  8.44   DLCO UNC% % 53  P 51  50   DLCO corrected ml/min/mmHg  9.07  8.44   DLCO COR %Predicted %  54  50   DLVA Predicted % 56  P 58  44   TLC L   5.32   TLC % Predicted %   119   RV % Predicted %   133     P Preliminary  result       LAB RESULTS last 96 hours No results found.       has a past medical history of Acid reflux, Anemia (2017), COPD (chronic obstructive pulmonary disease) (HCC) (03/19/2023), Difficult intravenous access, Hepatitis C (07/2014), History of blood transfusion (2017), HIV infection (HCC) (since 1993), Hypertension, Internal hemorrhoid, and Right inguinal hernia.   reports that she quit smoking about 15 years ago. Her smoking use included cigarettes. She started smoking about 60 years ago. She has a 33.8 pack-year smoking history. She has been exposed to tobacco smoke. She has never used smokeless tobacco.  Past Surgical History:  Procedure Laterality Date   BREAST BIOPSY Right 07/26/2008   CATARACT EXTRACTION Right 2016   CATARACT EXTRACTION Left 06/2019   COLONOSCOPY WITH PROPOFOL  N/A 06/11/2017   Procedure: COLONOSCOPY WITH PROPOFOL ;  Surgeon: Shila Gustav GAILS, MD;  Location: [facility];  Service: Endoscopy;  Laterality: N/A;  PT IS KNOWN HEART STICK. WILL PROBABLY REQUIRE ULTRASOUND OR ANESTHESIA FOR IV STICK.   ESOPHAGOGASTRODUODENOSCOPY (EGD) WITH PROPOFOL  N/A 06/11/2017   Procedure: ESOPHAGOGASTRODUODENOSCOPY (EGD) WITH PROPOFOL ;  Surgeon: Shila Gustav GAILS, MD;  Location: [facility];  Service: Endoscopy;  Laterality: N/A;    Allergies  Allergen Reactions   Iodinated Contrast Media Hives and Itching   Lisinopril  Itching and Cough   Other Hives and Itching     Seafood crustaceans  itchy throat    Immunization History  Administered Date(s) Administered   Fluad Quad(high Dose 65+) 01/07/2020, 02/15/2022, 09/19/2022   Fluad Trivalent(High Dose 65+) 09/24/2023   Hepatitis A 09/22/2010, 05/02/2011   Hepatitis B 05/10/2008, 06/09/2008, 10/11/2008   Hepatitis B, ADULT 10/14/2013, 11/13/2013, 05/17/2014   Influenza Split 10/08/2011, 09/18/2012   Influenza Whole 10/11/2008, 10/20/2009, 09/22/2010, 10/25/2014   Influenza,inj,Quad PF,6+ Mos 08/03/2013, 11/04/2017,  09/24/2018   Influenza-Unspecified 10/24/2015, 10/17/2016, 11/24/2020   Moderna SARS-COV2 Booster Vaccination 09/26/2022   PFIZER(Purple Top)SARS-COV-Olson Vaccination 02/05/2020, 03/02/2020, 11/01/2020   Pfizer(Comirnaty)Fall Seasonal Vaccine 12 years and older 11/14/2022   Pneumococcal Conjugate-13  09/24/2018   Pneumococcal Polysaccharide-23 09/09/2008, 04/01/2013, 01/07/2020    Family History  Problem Relation Age of Onset   Alzheimer's disease Mother    Glaucoma Mother    Hypertension Mother    Cancer Father        ? type   Seizures Son    Stomach cancer Neg Hx    Rectal cancer Neg Hx    Colon cancer Neg Hx    Esophageal cancer Neg Hx      Current Outpatient Medications:    acetaminophen  (TYLENOL ) 325 MG tablet, Take 325 mg by mouth daily as needed for moderate pain or headache., Disp: , Rfl:    amLODipine  (NORVASC ) 10 MG tablet, Take 10 mg by mouth daily., Disp: , Rfl:    Ascorbic Acid (VITAMIN C PO), Take 1 tablet by mouth daily., Disp: , Rfl:    aspirin 81 MG chewable tablet, Chew 81 mg by mouth daily., Disp: , Rfl:    azaTHIOprine  (IMURAN ) 50 MG tablet, Take 50 mg by mouth daily., Disp: , Rfl:    BIOTIN PO, Take 500 mcg by mouth daily., Disp: , Rfl:    calcium  carbonate (OSCAL) 1500 (600 Ca) MG TABS tablet, Take 1,500 mg by mouth daily with breakfast., Disp: , Rfl:    Cyanocobalamin (VITAMIN B12 PO), Take 1 tablet by mouth daily., Disp: , Rfl:    docusate sodium (COLACE) 100 MG capsule, Take 100 mg by mouth as needed for mild constipation., Disp: , Rfl:    famotidine (PEPCID) 40 MG tablet, Take 40 mg by mouth Olson (two) times daily., Disp: , Rfl:    ferrous sulfate  325 (65 FE) MG tablet, Take 1 tablet (325 mg total) by mouth daily., Disp: 30 tablet, Rfl: 0   fluticasone  (FLONASE ) 50 MCG/ACT nasal spray, Place Olson sprays into both nostrils daily as needed for allergies or rhinitis., Disp: , Rfl:    folic acid  (FOLVITE ) 1 MG tablet, Take 1 mg by mouth daily., Disp: , Rfl:     hydrocortisone  (ANUSOL -HC) 25 MG suppository, Place 25 mg rectally daily as needed for hemorrhoids., Disp: , Rfl:    Misc Natural Products (NEURIVA PO), Take 1 capsule by mouth daily., Disp: , Rfl:    pantoprazole  (PROTONIX ) 40 MG tablet, Take 1 tablet (40 mg total) by mouth Olson (two) times daily., Disp: 60 tablet, Rfl: 3   polyethylene glycol (MIRALAX  / GLYCOLAX ) packet, Take 17 g by mouth every other day., Disp: , Rfl:    rosuvastatin  (CRESTOR ) 20 MG tablet, Take 20 mg by mouth in the morning., Disp: , Rfl:    SPIRIVA  RESPIMAT 1.25 MCG/ACT AERS, USE Olson INHALATIONS BY MOUTH DAILY, Disp: 12 g, Rfl: 3   valACYclovir (VALTREX) 1000 MG tablet, Take 1,000 mg by mouth daily. , Disp: , Rfl:    Zinc 50 MG TABS, Take 50 mg by mouth daily., Disp: , Rfl:    azelastine  (ASTELIN ) 0.1 % nasal spray, Place Olson sprays into both nostrils Olson (two) times daily. Use in each nostril as directed, Disp: 30 mL, Rfl: 0   benzonatate  (TESSALON ) 100 MG capsule, Take 1 capsule (100 mg total) by mouth every 8 (eight) hours. (Patient not taking: Reported on 05/28/2024), Disp: 21 capsule, Rfl: 0   doxycycline (MONODOX) 100 MG capsule, Take 100 mg by mouth Olson (two) times daily. (Patient not taking: Reported on 05/28/2024), Disp: , Rfl:    OXYGEN, Inhale 1.5 L into the lungs at bedtime. (Patient not taking: Reported on 05/28/2024), Disp: , Rfl:  Objective:   Vitals:   06/04/24 1616 06/04/24 1617  BP:  124/68  Pulse: (!) 54   SpO2: 99%   Weight:  123 lb (55.8 kg)  Height:  5' (1.524 m)    Estimated body mass index is 24.02 kg/m as calculated from the following:   Height as of this encounter: 5' (1.524 m).   Weight as of this encounter: 123 lb (55.8 kg).  @WEIGHTCHANGE @  [employer]   06/04/24 1617  Weight: 123 lb (55.8 kg)     Physical Exam 2l  General: No distress. Looks well O2 at rest: no Cane present: no Sitting in wheel chair: no Frail: no Obese: no Neuro: Alert and Oriented x 3. GCS 15. Speech  normal Psych: Pleasant Resp:  Barrel Chest - no.  Wheeze - no, Crackles - no, No overt respiratory distress CVS: Normal heart sounds. Murmurs - no Ext: Stigmata of Connective Tissue Disease - no HEENT: Normal upper airway. PEERL +. No post nasal drip        Assessment:       ICD-10-CM   1. Pulmonary emphysema, unspecified emphysema type (HCC)  J43.9 CT Chest High Resolution    Pulmonary function test    Olson. Former smoker  Z87.891 CT Chest High Resolution    Pulmonary function test    3. Interstitial lung abnormality (ILA)  R91.8 CT Chest High Resolution    Pulmonary function test    4. Positive ANA (antinuclear antibody)  R76.8 CT Chest High Resolution    Pulmonary function test    5. Rheumatoid factor positive  R76.8 CT Chest High Resolution    Pulmonary function test    6. Pre-operative respiratory examination  Z01.811       1) RISK FOR PROLONGED MECHANICAL VENTILAION - > 48h  1A) Arozullah - Prolonged mech ventilation risk Arozullah Postperative Pulmonary Risk Score - for mech ventilation dependence >48h US[employer], Ann Surg 2000, major non-cardiac surgery) Comment Score  Type of surgery - abd ao aneurysm (27), thoracic (21), neurosurgery / upper abdominal / vascular (21), neck (11) hernia 0  Emergency Surgery - (11) electv 0  ALbumin < 3 or poor nutritional state - (9) good 0  BUN > 30 -  (8) noram 0  Partial or completely dependent functional status - (7) functional 0  COPD -  (6) ILA/HIV 6  Age - 60 to 23 (4), > 70  (6) 76 6  TOTAL  12  Risk Stratifcation scores  - < 10 (0.5%), 11-19 (1.8%), 20-27 (4.Olson%), 28-40 (10.1%), >40 (26.6%)  Low moderate risk        Plan:     Patient Instructions     ICD-10-CM   1. Pulmonary emphysema, unspecified emphysema type (HCC)  J43.9     Olson. Former smoker  Z87.891     3. Interstitial lung abnormality (ILA)  R91.8     4. Positive ANA (antinuclear antibody)  R76.8     5. Rheumatoid factor positive  R76.8       # Emphysema - stable and well on spiriva   #Interstial lung inflammation - clinically stable thought last year radiologist thought CT showed incraed inflammation. However your symptoms and PFTS are stable. Given  > 250  CD4 doubt HIV rlated lung inflammation   Plan - Continue  low-dose Spiriva   1.25 mcg @ Olson puff  each tme but only once daily - control GERD - continue immuran for eye issue - continue albuterol as needed - - do  spiro dllco in 6-7 mnths - do HRCT in 6 months   #PREOP Pulmonary risk for hernia surgery  - LOW - MOderate RISK for prolonged ventilator dependence  Plan  -per surgeon  Followup -6 month routine 15 min followup  - walk test, symptom score at followup  - but after PFT and CT     FOLLOWUP Return in about 6 months (around 12/04/2024) for 15 min visit, after Spiro and DLCO, ILD, after HRCT chest, with Dr Tina, Face to Face Visit.    SIGNATURE    Dr. Dorethia Tina, M.D., F.C.C.P,  Pulmonary and Critical Care Medicine Staff Physician, [facility] Director - Interstitial Lung Disease  Program  Pulmonary Fibrosis [facility] [place], [place], [address]  Pager: [phone], If no answer or between  15:00h - 7:00h: call [phone] Telephone: [phone]  5:09 PM 06/04/2024

## 2024-06-04 NOTE — Patient Instructions (Addendum)
 ICD-10-CM   1. Pulmonary emphysema, unspecified emphysema type (HCC)  J43.9     2. Former smoker  Z87.891     3. Interstitial lung abnormality (ILA)  R91.8     4. Positive ANA (antinuclear antibody)  R76.8     5. Rheumatoid factor positive  R76.8      # Emphysema - stable and well on spiriva   #Interstial lung inflammation - clinically stable thought last year radiologist thought CT showed incraed inflammation. However your symptoms and PFTS are stable. Given  > 250  CD4 doubt HIV rlated lung inflammation   Plan - Continue  low-dose Spiriva   1.25 mcg @ 2 puff  each tme but only once daily - control GERD - continue immuran for eye issue - continue albuterol as needed - - do spiro dllco in 6-7 mnths - do HRCT in 6 months   #PREOP Pulmonary risk for hernia surgery  - LOW - MOderate RISK for prolonged ventilator dependence  Plan  -per surgeon  Followup -6 month routine 15 min followup  - walk test, symptom score at followup  - but after PFT and CT

## 2024-06-05 NOTE — Telephone Encounter (Signed)
 Copy of ov note with risk assessment from 06/04/24 was faxed to [facility].

## 2024-06-08 NOTE — Progress Notes (Signed)
 Anesthesia Review:  PCP: Cardiologist : Pulm-Ramaswamy- LOV 06/04/24   PPM/ ICD: Device Orders: Rep Notified:  Chest x-ray : EKG : Echo : 2023  PFT-06/03/24  Stress test: Cardiac Cath :  Ct Cors-2023  CT Chest- 10/18/23   Activity level:  Sleep Study/ CPAP : Fasting Blood Sugar :      / Checks Blood Sugar -- times a day:    Blood Thinner/ Instructions /Last Dose: ASA / Instructions/ Last Dose :

## 2024-06-09 NOTE — Patient Instructions (Signed)
 SURGICAL WAITING ROOM VISITATION  Patients having surgery or a procedure may have no more than 2 support people in the waiting area - these visitors may rotate.    Children under the age of 58 must have an adult with them who is not the patient.  Visitors with respiratory illnesses are discouraged from visiting and should remain at home.  If the patient needs to stay at the hospital during part of their recovery, the visitor guidelines for inpatient rooms apply. Pre-op nurse will coordinate an appropriate time for 1 support person to accompany patient in pre-op.  This support person may not rotate.    Please refer to the [facility] website for the visitor guidelines for Inpatients (after your surgery is over and you are in a regular room).       Your procedure is scheduled on:  06/15/2024    Report to [facility] Main Entrance    Report to admitting at   0700AM   Call this number if you have problems the morning of surgery [phone]   Do not eat food  or drink liquids :After Midnight.            If you have questions, please contact your surgeon's office.      Oral Hygiene is also important to reduce your risk of infection.                                    Remember - BRUSH YOUR TEETH THE MORNING OF SURGERY WITH YOUR REGULAR TOOTHPASTE  DENTURES WILL BE REMOVED PRIOR TO SURGERY PLEASE DO NOT APPLY Poly grip OR ADHESIVES!!!   Do NOT smoke after Midnight   Stop all vitamins and herbal supplements 7 days before surgery.   Take these medicines the morning of surgery with A SIP OF WATER:  amlodipinekl pepcid, protonix , spiriva    DO NOT TAKE ANY ORAL DIABETIC MEDICATIONS DAY OF YOUR SURGERY  Bring CPAP mask and tubing day of surgery.                              You may not have any metal on your body including hair pins, jewelry, and body piercing             Do not wear make-up, lotions, powders, perfumes/cologne, or deodorant  Do not wear nail polish  including gel and S&S, artificial/acrylic nails, or any other type of covering on natural nails including finger and toenails. If you have artificial nails, gel coating, etc. that needs to be removed by a nail salon please have this removed prior to surgery or surgery may need to be canceled/ delayed if the surgeon/ anesthesia feels like they are unable to be safely monitored.   Do not shave  48 hours prior to surgery.               Men may shave face and neck.   Do not bring valuables to the hospital. [facility] IS NOT             RESPONSIBLE   FOR VALUABLES.   Contacts, glasses, dentures or bridgework may not be worn into surgery.   Bring small overnight bag day of surgery.   DO NOT BRING YOUR HOME MEDICATIONS TO THE HOSPITAL. PHARMACY WILL DISPENSE MEDICATIONS LISTED ON YOUR MEDICATION LIST TO YOU DURING YOUR ADMISSION IN THE  HOSPITAL!    Patients discharged on the day of surgery will not be allowed to drive home.  Someone NEEDS to stay with you for the first 24 hours after anesthesia.   Special Instructions: Bring a copy of your healthcare power of attorney and living will documents the day of surgery if you haven't scanned them before.              Please read over the following fact sheets you were given: IF YOU HAVE QUESTIONS ABOUT YOUR PRE-OP INSTRUCTIONS PLEASE CALL [phone]   If you received a COVID test during your pre-op visit  it is requested that you wear a mask when out in public, stay away from anyone that may not be feeling well and notify your surgeon if you develop symptoms. If you test positive for Covid or have been in contact with anyone that has tested positive in the last 10 days please notify you surgeon.    [facility] - Preparing for Surgery Before surgery, you can play an important role.  Because skin is not sterile, your skin needs to be as free of germs as possible.  You can reduce the number of germs on your skin by washing with CHG (chlorahexidine  gluconate) soap before surgery.  CHG is an antiseptic cleaner which kills germs and bonds with the skin to continue killing germs even after washing. Please DO NOT use if you have an allergy to CHG or antibacterial soaps.  If your skin becomes reddened/irritated stop using the CHG and inform your nurse when you arrive at Short Stay. Do not shave (including legs and underarms) for at least 48 hours prior to the first CHG shower.  You may shave your face/neck. Please follow these instructions carefully:  1.  Shower with CHG Soap the night before surgery and the  morning of Surgery.  2.  If you choose to wash your hair, wash your hair first as usual with your  normal  shampoo.  3.  After you shampoo, rinse your hair and body thoroughly to remove the  shampoo.                           4.  Use CHG as you would any other liquid soap.  You can apply chg directly  to the skin and wash                       Gently with a scrungie or clean washcloth.  5.  Apply the CHG Soap to your body ONLY FROM THE NECK DOWN.   Do not use on face/ open                           Wound or open sores. Avoid contact with eyes, ears mouth and genitals (private parts).                       Wash face,  Genitals (private parts) with your normal soap.             6.  Wash thoroughly, paying special attention to the area where your surgery  will be performed.  7.  Thoroughly rinse your body with warm water from the neck down.  8.  DO NOT shower/wash with your normal soap after using and rinsing off  the CHG Soap.  9.  Pat yourself dry with a clean towel.            10.  Wear clean pajamas.            11.  Place clean sheets on your bed the night of your first shower and do not  sleep with pets. Day of Surgery : Do not apply any lotions/deodorants the morning of surgery.  Please wear clean clothes to the hospital/surgery center.  FAILURE TO FOLLOW THESE INSTRUCTIONS MAY RESULT IN THE CANCELLATION OF YOUR  SURGERY PATIENT SIGNATURE_________________________________  NURSE SIGNATURE__________________________________  ________________________________________________________________________

## 2024-06-10 ENCOUNTER — Encounter (HOSPITAL_COMMUNITY)
Admission: RE | Admit: 2024-06-10 | Discharge: 2024-06-10 | Disposition: A | Discharge status: Home / Self Care | Source: Ambulatory Visit | Attending: Surgery | Admitting: Surgery

## 2024-06-10 ENCOUNTER — Encounter (HOSPITAL_COMMUNITY): Payer: Self-pay

## 2024-06-10 ENCOUNTER — Other Ambulatory Visit: Payer: Self-pay

## 2024-06-10 VITALS — BP 142/69 | HR 57 | Temp 98.5°F | Resp 16 | Ht 60.0 in | Wt 126.0 lb

## 2024-06-10 DIAGNOSIS — K4020 Bilateral inguinal hernia, without obstruction or gangrene, not specified as recurrent: Secondary | ICD-10-CM | POA: Diagnosis not present

## 2024-06-10 DIAGNOSIS — Z87891 Personal history of nicotine dependence: Secondary | ICD-10-CM | POA: Diagnosis not present

## 2024-06-10 DIAGNOSIS — R001 Bradycardia, unspecified: Secondary | ICD-10-CM | POA: Insufficient documentation

## 2024-06-10 DIAGNOSIS — I2510 Atherosclerotic heart disease of native coronary artery without angina pectoris: Secondary | ICD-10-CM | POA: Insufficient documentation

## 2024-06-10 DIAGNOSIS — J439 Emphysema, unspecified: Secondary | ICD-10-CM | POA: Insufficient documentation

## 2024-06-10 DIAGNOSIS — D72819 Decreased white blood cell count, unspecified: Secondary | ICD-10-CM | POA: Insufficient documentation

## 2024-06-10 DIAGNOSIS — Z8619 Personal history of other infectious and parasitic diseases: Secondary | ICD-10-CM | POA: Diagnosis not present

## 2024-06-10 DIAGNOSIS — K219 Gastro-esophageal reflux disease without esophagitis: Secondary | ICD-10-CM | POA: Insufficient documentation

## 2024-06-10 DIAGNOSIS — D696 Thrombocytopenia, unspecified: Secondary | ICD-10-CM | POA: Diagnosis not present

## 2024-06-10 DIAGNOSIS — I10 Essential (primary) hypertension: Secondary | ICD-10-CM | POA: Insufficient documentation

## 2024-06-10 DIAGNOSIS — Z01818 Encounter for other preprocedural examination: Secondary | ICD-10-CM | POA: Diagnosis present

## 2024-06-10 DIAGNOSIS — I700 Atherosclerosis of aorta: Secondary | ICD-10-CM | POA: Insufficient documentation

## 2024-06-10 DIAGNOSIS — Z21 Asymptomatic human immunodeficiency virus [HIV] infection status: Secondary | ICD-10-CM | POA: Diagnosis not present

## 2024-06-10 LAB — COMPREHENSIVE METABOLIC PANEL WITH GFR
ALT: 21 U/L (ref 0–44)
AST: 30 U/L (ref 15–41)
Albumin: 3.7 g/dL (ref 3.5–5.0)
Alkaline Phosphatase: 48 U/L (ref 38–126)
Anion gap: 8 (ref 5–15)
BUN: 12 mg/dL (ref 8–23)
CO2: 21 mmol/L — ABNORMAL LOW (ref 22–32)
Calcium: 9.9 mg/dL (ref 8.9–10.3)
Chloride: 110 mmol/L (ref 98–111)
Creatinine, Ser: 0.91 mg/dL (ref 0.44–1.00)
GFR, Estimated: >60 mL/min (ref >60)
Glucose, Bld: 106 mg/dL — ABNORMAL HIGH (ref 70–99)
Potassium: 3.7 mmol/L (ref 3.5–5.1)
Sodium: 139 mmol/L (ref 135–145)
Total Bilirubin: 0.7 mg/dL (ref 0.0–1.2)
Total Protein: 8.2 g/dL — ABNORMAL HIGH (ref 6.5–8.1)

## 2024-06-10 LAB — CBC
HCT: 37.6 % (ref 36.0–46.0)
Hemoglobin: 12 g/dL (ref 12.0–15.0)
MCH: 31.8 pg (ref 26.0–34.0)
MCHC: 31.9 g/dL (ref 30.0–36.0)
MCV: 99.7 fL (ref 80.0–100.0)
Platelets: 129 10*3/uL — ABNORMAL LOW (ref 150–400)
RBC: 3.77 MIL/uL — ABNORMAL LOW (ref 3.87–5.11)
RDW: 16.4 % — ABNORMAL HIGH (ref 11.5–15.5)
WBC: 2.3 10*3/uL — ABNORMAL LOW (ref 4.0–10.5)
nRBC: 0 % (ref 0.0–0.2)

## 2024-06-10 NOTE — Anesthesia Preprocedure Evaluation (Addendum)
 Anesthesia Evaluation  Patient identified by MRN, date of birth, ID band Patient awake    Reviewed: Allergy & Precautions, NPO status , Patient's Chart, lab work & pertinent test results  Airway Mallampati: II  TM Distance: >3 FB Neck ROM: Full    Dental  (+) Missing, Edentulous Upper   Pulmonary COPD,  COPD inhaler, former smoker   Pulmonary exam normal        Cardiovascular hypertension, Pt. on medications Normal cardiovascular exam     Neuro/Psych negative neurological ROS  negative psych ROS   GI/Hepatic ,GERD  Medicated and Controlled,,(+) Hepatitis -  Endo/Other  negative endocrine ROS    Renal/GU negative Renal ROS     Musculoskeletal negative musculoskeletal ROS (+)    Abdominal   Peds  Hematology  (+) HIV  Anesthesia Other Findings Non-recurrent bilateral inguinal hernia without obstruction or gangrene  Reproductive/Obstetrics                              Anesthesia Physical Anesthesia Plan  ASA: 3  Anesthesia Plan: General   Post-op Pain Management:    Induction: Intravenous  PONV Risk Score and Plan: 3 and Ondansetron , Dexamethasone  and Treatment may vary due to age or medical condition  Airway Management Planned: Oral ETT  Additional Equipment:   Intra-op Plan:   Post-operative Plan: Extubation in OR  Informed Consent: I have reviewed the patients History and Physical, chart, labs and discussed the procedure including the risks, benefits and alternatives for the proposed anesthesia with the patient or authorized representative who has indicated his/her understanding and acceptance.     Dental advisory given  Plan Discussed with: CRNA  Anesthesia Plan Comments: (PAT note from 7/2. Pt with difficult IV access. IR consult placed by central [facility] for [employer])         Anesthesia Quick Evaluation

## 2024-06-10 NOTE — Progress Notes (Signed)
 Case: [id number] Date/Time: 06/15/24 0845   Procedure: REPAIR, HERNIA, INGUINAL, ADULT (Right)   Anesthesia type: General   Diagnosis: Non-recurrent bilateral inguinal hernia without obstruction or gangrene [K40.20]   Pre-op diagnosis: RIGHT INGUINAL HERNIA   Location[facility] ROOM 01 / [facility]   Surgeons: Signe Mitzie LABOR, MD       DISCUSSION: Tina Olson is a 78 year old female who presents to PAT prior to surgery above.  Past medical history significant for former smoking, CAD (by CT), hypertension, COPD, HIV (on no meds since mid 90s), history of hep C (partially treated with Harvoni  with negative follow-up labs), GERD, chronic leukopenia and thrombocytopenia  Prior anesthesia complications include difficult IV access. Patient was originally scheduled for surgery in 2021 however case had to be canceled due to not being able to get IV access even with ultrasound assistance. Since that time she was able to undergo endoscopy in 08/2023 and states that they were able to establish an IV for that. Of note during EGD she experienced laryngospasm and jaw thrust with PPV were performed. She also had a large amount of secretions and was given robinul. Plan is to coordinate IV access with IR prior to case.   Patient follows with ID for her HIV. She is an elite controller and remains of ART. CD4 count 293 and HIV load 170. Advised continue to monitor labs. Return in 6 months.  Patient had evaluated by Cardiology in 07/2022 due to chest pain. She underwent coronary CTA which showed nonobstructive CAD. Medical therapy advised. CT also showed ILD and COPD and she was referred to pulmonology.  Patient follows with Pulmonology for COPD and interstitial lung abnormality.  COPD noted to be stable.  She uses inhalers.  She is prescribed home O2 but does not use. She does not have a formal diagnosis of ILD but reported interstitial lung inflammation.  Per Dr. Geronimo her symptoms and PFTs have been stable.  Thought  to be low to moderate risk for prolonged ventilator dependence.  Patient follows with rheumatology due to positive ANA and possible ILD on imaging. Per Dr. Jeannetta at last office visit in 2024: She is already on long-term treatment with azathioprine  no findings indicating additional DMARD treatment or further labs recommended at this time.. Advised f/u prn   VS: BP (!) 142/69   Pulse (!) 57   Temp 36.9 C (Oral)   Resp 16   Ht 5' (1.524 m)   Wt 57.2 kg   SpO2 100%   BMI 24.61 kg/m   PROVIDERS: Haze Kingfisher, MD Reyes Fenton- LOV 03/17/24  Cardiologist : lonni Nanas LOV 06/2022  Pulm-Ramaswamy- LOV 06/04/24   LABS: Labs reviewed: Acceptable for surgery. (all labs ordered are listed, but only abnormal results are displayed)  Labs Reviewed  CBC - Abnormal; Notable for the following components:      Result Value   WBC 2.3 (*)    RBC 3.77 (*)    RDW 16.4 (*)    Platelets 129 (*)    All other components within normal limits  COMPREHENSIVE METABOLIC PANEL WITH GFR - Abnormal; Notable for the following components:   CO2 21 (*)    Glucose, Bld 106 (*)    Total Protein 8.2 (*)    All other components within normal limits     IMAGES: CT chest 10/02/2023:  IMPRESSION: 1. Worsening mid and lower lung zone predominant coarsened parenchymal and subpleural ground-glass with scattered septal thickening and subpleural reticular densities. Cylindrical bronchiectasis. Suspect air trapping.  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 Findings are suggestive of an alternative diagnosis (not UIP) per consensus guidelines: Diagnosis of Idiopathic Pulmonary Fibrosis: An Official ATS/ERS/JRS/ALAT Clinical Practice Guideline. Am JINNY [employer] Crit Care Med Vol 198, Iss 5, [phone], Aug 10 2017. 2. Aortic atherosclerosis (ICD10-I70.0). Coronary artery calcification. 3.  Emphysema (ICD10-J43.9).      EKG 06/10/2024 Sinus bradycardia, 49 Nonspecific ST and T  wave abnormality  CV:  Coronary CTA 07/23/22:  IMPRESSION: 1. Coronary calcium  score of 438. This was 90th percentile for age-, race-, and sex-matched controls.   2. Normal coronary origin with right dominance.   3. There is moderate (50-69%) plaque in the mid RCA an otherwise minimal to mild disease. CAD RADS 3.   4.  Diffuse aortic atherosclerosis.   5.  Will send study for FFRct.  IMPRESSION: FFRct findings are consistent with non-obstructive disease.   Recommend aggressive risk factor modification including LDL goal <70.   Echo 05/09/2022:  Conclusions:  Normal LV systolic function with visual EF 55 to 60%. Left ventricle cavity is normal in size Mild concentric hypertrophy of the left ventricle Normal diastolic filling pattern Wall motion cannot be accurately assessed due to technically limited images Trileaflet aortic valve with no regurgitation Mild aortic valve leaflet thickening No evidence of aortic valve stenosis Trace mitral regurgitation Mild mitral valve leaflet thickening Past Medical History:  Diagnosis Date   Acid reflux    Anemia 2017   COPD (chronic obstructive pulmonary disease) (HCC) 03/19/2023   Difficult intravenous access    Hepatitis C 07/2014   took harvani tx    History of blood transfusion 2017   2 units   HIV infection (HCC) since 1993   Hypertension    Internal hemorrhoid    Right inguinal hernia     Past Surgical History:  Procedure Laterality Date   BREAST BIOPSY Right 07/26/2008   CATARACT EXTRACTION Right 2016   CATARACT EXTRACTION Left 06/2019   COLONOSCOPY WITH PROPOFOL  N/A 06/11/2017   Procedure: COLONOSCOPY WITH PROPOFOL ;  Surgeon: Shila Gustav GAILS, MD;  Location: [facility];  Service: Endoscopy;  Laterality: N/A;  PT IS KNOWN HEART STICK. WILL PROBABLY REQUIRE ULTRASOUND OR ANESTHESIA FOR IV STICK.   ESOPHAGOGASTRODUODENOSCOPY (EGD) WITH PROPOFOL  N/A 06/11/2017   Procedure: ESOPHAGOGASTRODUODENOSCOPY (EGD) WITH  PROPOFOL ;  Surgeon: Shila Gustav GAILS, MD;  Location: [facility];  Service: Endoscopy;  Laterality: N/A;    MEDICATIONS:  acetaminophen  (TYLENOL ) 325 MG tablet   amLODipine  (NORVASC ) 10 MG tablet   Ascorbic Acid (VITAMIN C PO)   aspirin 81 MG chewable tablet   azaTHIOprine  (IMURAN ) 50 MG tablet   azelastine  (ASTELIN ) 0.1 % nasal spray   benzonatate  (TESSALON ) 100 MG capsule   BIOTIN PO   calcium  carbonate (OSCAL) 1500 (600 Ca) MG TABS tablet   Cyanocobalamin (VITAMIN B12 PO)   docusate sodium (COLACE) 100 MG capsule   doxycycline (MONODOX) 100 MG capsule   famotidine (PEPCID) 40 MG tablet   ferrous sulfate  325 (65 FE) MG tablet   fluticasone  (FLONASE ) 50 MCG/ACT nasal spray   folic acid  (FOLVITE ) 1 MG tablet   hydrocortisone  (ANUSOL -HC) 25 MG suppository   Misc Natural Products (NEURIVA PO)   OXYGEN   pantoprazole  (PROTONIX ) 40 MG tablet   polyethylene glycol (MIRALAX  / GLYCOLAX ) packet   rosuvastatin  (CRESTOR ) 20 MG tablet   SPIRIVA  RESPIMAT 1.25 MCG/ACT AERS   valACYclovir (VALTREX) 1000 MG tablet   Zinc 50 MG TABS   No current facility-administered medications for this encounter.  Burnard CHRISTELLA Odis DEVONNA [facility] Short Stay/Anesthesiology [facility] Phone [phone] 06/10/2024 11:15 AM

## 2024-06-15 ENCOUNTER — Encounter (HOSPITAL_COMMUNITY): Payer: Self-pay | Admitting: Surgery

## 2024-06-15 ENCOUNTER — Encounter (HOSPITAL_COMMUNITY)
Admission: RE | Disposition: A | Discharge status: Home / Self Care | Payer: Self-pay | Source: Ambulatory Visit | Attending: Surgery

## 2024-06-15 ENCOUNTER — Ambulatory Visit (HOSPITAL_BASED_OUTPATIENT_CLINIC_OR_DEPARTMENT_OTHER): Admitting: Anesthesiology

## 2024-06-15 ENCOUNTER — Ambulatory Visit (HOSPITAL_COMMUNITY)
Admission: RE | Admit: 2024-06-15 | Discharge: 2024-06-15 | Disposition: A | Discharge status: Home / Self Care | Source: Ambulatory Visit | Attending: Surgery | Admitting: Surgery

## 2024-06-15 ENCOUNTER — Other Ambulatory Visit: Payer: Self-pay

## 2024-06-15 ENCOUNTER — Ambulatory Visit (HOSPITAL_COMMUNITY): Payer: Self-pay | Admitting: Medical

## 2024-06-15 DIAGNOSIS — Z01818 Encounter for other preprocedural examination: Secondary | ICD-10-CM

## 2024-06-15 DIAGNOSIS — K4090 Unilateral inguinal hernia, without obstruction or gangrene, not specified as recurrent: Secondary | ICD-10-CM

## 2024-06-15 DIAGNOSIS — J449 Chronic obstructive pulmonary disease, unspecified: Secondary | ICD-10-CM | POA: Insufficient documentation

## 2024-06-15 DIAGNOSIS — I7090 Unspecified atherosclerosis: Secondary | ICD-10-CM | POA: Insufficient documentation

## 2024-06-15 DIAGNOSIS — Z21 Asymptomatic human immunodeficiency virus [HIV] infection status: Secondary | ICD-10-CM | POA: Insufficient documentation

## 2024-06-15 DIAGNOSIS — J432 Centrilobular emphysema: Secondary | ICD-10-CM | POA: Diagnosis not present

## 2024-06-15 DIAGNOSIS — K4020 Bilateral inguinal hernia, without obstruction or gangrene, not specified as recurrent: Secondary | ICD-10-CM | POA: Insufficient documentation

## 2024-06-15 DIAGNOSIS — Z8249 Family history of ischemic heart disease and other diseases of the circulatory system: Secondary | ICD-10-CM | POA: Insufficient documentation

## 2024-06-15 DIAGNOSIS — I10 Essential (primary) hypertension: Secondary | ICD-10-CM | POA: Diagnosis not present

## 2024-06-15 DIAGNOSIS — Z8619 Personal history of other infectious and parasitic diseases: Secondary | ICD-10-CM | POA: Diagnosis not present

## 2024-06-15 DIAGNOSIS — K219 Gastro-esophageal reflux disease without esophagitis: Secondary | ICD-10-CM | POA: Diagnosis not present

## 2024-06-15 DIAGNOSIS — D649 Anemia, unspecified: Secondary | ICD-10-CM | POA: Diagnosis not present

## 2024-06-15 DIAGNOSIS — Z87891 Personal history of nicotine dependence: Secondary | ICD-10-CM | POA: Diagnosis not present

## 2024-06-15 DIAGNOSIS — I700 Atherosclerosis of aorta: Secondary | ICD-10-CM | POA: Diagnosis not present

## 2024-06-15 DIAGNOSIS — K5909 Other constipation: Secondary | ICD-10-CM | POA: Insufficient documentation

## 2024-06-15 HISTORY — PX: INGUINAL HERNIA REPAIR: SHX194

## 2024-06-15 SURGERY — REPAIR, HERNIA, INGUINAL, ADULT
Anesthesia: General | Laterality: Right

## 2024-06-15 MED ORDER — ORAL CARE MOUTH RINSE: 15.0000 mL | Freq: Once | OROMUCOSAL | Status: DC

## 2024-06-15 MED ORDER — DEXAMETHASONE SODIUM PHOSPHATE 10 MG/ML IJ SOLN
INTRAMUSCULAR | Status: DC | PRN
  Administered 2024-06-15: 5 mg via INTRAVENOUS

## 2024-06-15 MED ORDER — ONDANSETRON HCL 4 MG/2ML IJ SOLN
INTRAMUSCULAR | Status: DC | PRN
  Administered 2024-06-15: 4 mg via INTRAVENOUS

## 2024-06-15 MED ORDER — AMISULPRIDE (ANTIEMETIC) 5 MG/2ML IV SOLN: 10.0000 mg | Freq: Once | INTRAVENOUS | Status: DC | PRN

## 2024-06-15 MED ORDER — ALBUTEROL SULFATE HFA 108 (90 BASE) MCG/ACT IN AERS
INHALATION_SPRAY | RESPIRATORY_TRACT | Status: DC | PRN
  Administered 2024-06-15: 2 via RESPIRATORY_TRACT

## 2024-06-15 MED ORDER — FENTANYL CITRATE (PF) 100 MCG/2ML IJ SOLN
INTRAMUSCULAR | Status: AC
  Filled 2024-06-15: qty 2

## 2024-06-15 MED ORDER — SUGAMMADEX SODIUM 200 MG/2ML IV SOLN
INTRAVENOUS | Status: DC | PRN
  Administered 2024-06-15: 200 mg via INTRAVENOUS

## 2024-06-15 MED ORDER — ONDANSETRON HCL 4 MG/2ML IJ SOLN
INTRAMUSCULAR | Status: AC
  Filled 2024-06-15: qty 2

## 2024-06-15 MED ORDER — FENTANYL CITRATE PF 50 MCG/ML IJ SOSY: 25.0000 ug | PREFILLED_SYRINGE | INTRAMUSCULAR | Status: DC | PRN

## 2024-06-15 MED ORDER — LIDOCAINE HCL (PF) 2 % IJ SOLN
INTRAMUSCULAR | Status: AC
  Filled 2024-06-15: qty 5

## 2024-06-15 MED ORDER — CHLORHEXIDINE GLUCONATE 4 % EX SOLN: 60.0000 mL | Freq: Once | CUTANEOUS | Status: DC

## 2024-06-15 MED ORDER — GLYCOPYRROLATE 0.2 MG/ML IJ SOLN
INTRAMUSCULAR | Status: DC | PRN
  Administered 2024-06-15: .2 mg via INTRAVENOUS

## 2024-06-15 MED ORDER — ACETAMINOPHEN 500 MG PO TABS
1000.0000 mg | ORAL_TABLET | ORAL | Status: AC
  Administered 2024-06-15: 1000 mg via ORAL
  Filled 2024-06-15: qty 2

## 2024-06-15 MED ORDER — GABAPENTIN 300 MG PO CAPS
300.0000 mg | ORAL_CAPSULE | ORAL | Status: AC
  Administered 2024-06-15: 300 mg via ORAL
  Filled 2024-06-15: qty 1

## 2024-06-15 MED ORDER — LACTATED RINGERS IV SOLN: INTRAVENOUS | Status: DC

## 2024-06-15 MED ORDER — EPHEDRINE SULFATE-NACL 50-0.9 MG/10ML-% IV SOSY
PREFILLED_SYRINGE | INTRAVENOUS | Status: DC | PRN
  Administered 2024-06-15 (×2): 7.5 mg via INTRAVENOUS

## 2024-06-15 MED ORDER — PROPOFOL 10 MG/ML IV BOLUS
INTRAVENOUS | Status: AC
  Filled 2024-06-15: qty 20

## 2024-06-15 MED ORDER — FENTANYL CITRATE (PF) 100 MCG/2ML IJ SOLN
INTRAMUSCULAR | Status: DC | PRN
  Administered 2024-06-15 (×2): 50 ug via INTRAVENOUS

## 2024-06-15 MED ORDER — OXYCODONE HCL 5 MG PO TABS: 5.0000 mg | ORAL_TABLET | Freq: Three times a day (TID) | ORAL | 0 refills | Status: AC | PRN

## 2024-06-15 MED ORDER — KETAMINE HCL 50 MG/5ML IJ SOSY
PREFILLED_SYRINGE | INTRAMUSCULAR | Status: AC
Start: 2024-06-15 — End: 2024-06-15
  Filled 2024-06-15: qty 5

## 2024-06-15 MED ORDER — CEFAZOLIN SODIUM-DEXTROSE 2-4 GM/100ML-% IV SOLN
2.0000 g | INTRAVENOUS | Status: AC
  Administered 2024-06-15: 2 g via INTRAVENOUS
  Filled 2024-06-15: qty 100

## 2024-06-15 MED ORDER — DOCUSATE SODIUM 100 MG PO CAPS: 100.0000 mg | ORAL_CAPSULE | Freq: Two times a day (BID) | ORAL | 0 refills | Status: AC

## 2024-06-15 MED ORDER — LIDOCAINE HCL (PF) 2 % IJ SOLN
INTRAMUSCULAR | Status: DC | PRN
  Administered 2024-06-15: 100 mg via INTRADERMAL

## 2024-06-15 MED ORDER — CHLORHEXIDINE GLUCONATE 0.12 % MT SOLN: 15.0000 mL | Freq: Once | OROMUCOSAL | Status: DC

## 2024-06-15 MED ORDER — CHLORHEXIDINE GLUCONATE 0.12 % MT SOLN
15.0000 mL | Freq: Once | OROMUCOSAL | Status: AC
  Administered 2024-06-15: 15 mL via OROMUCOSAL

## 2024-06-15 MED ORDER — ORAL CARE MOUTH RINSE: 15.0000 mL | Freq: Once | OROMUCOSAL | Status: AC

## 2024-06-15 MED ORDER — BUPIVACAINE LIPOSOME 1.3 % IJ SUSP
INTRAMUSCULAR | Status: AC
  Filled 2024-06-15: qty 20

## 2024-06-15 MED ORDER — BUPIVACAINE HCL (PF) 0.5 % IJ SOLN
INTRAMUSCULAR | Status: DC | PRN
  Administered 2024-06-15: 50 mL

## 2024-06-15 MED ORDER — PROPOFOL 10 MG/ML IV BOLUS
INTRAVENOUS | Status: DC | PRN
  Administered 2024-06-15: 50 mg via INTRAVENOUS

## 2024-06-15 MED ORDER — ROCURONIUM BROMIDE 100 MG/10ML IV SOLN
INTRAVENOUS | Status: DC | PRN
  Administered 2024-06-15: 50 mg via INTRAVENOUS
  Administered 2024-06-15: 10 mg via INTRAVENOUS

## 2024-06-15 MED ORDER — BUPIVACAINE LIPOSOME 1.3 % IJ SUSP: 20.0000 mL | Freq: Once | INTRAMUSCULAR | Status: DC

## 2024-06-15 MED ORDER — ONDANSETRON HCL 4 MG/2ML IJ SOLN: 4.0000 mg | Freq: Once | INTRAMUSCULAR | Status: DC | PRN

## 2024-06-15 MED ORDER — BUPIVACAINE-EPINEPHRINE (PF) 0.25% -1:200000 IJ SOLN
INTRAMUSCULAR | Status: AC
  Filled 2024-06-15: qty 30

## 2024-06-15 MED ORDER — ROCURONIUM BROMIDE 10 MG/ML (PF) SYRINGE
PREFILLED_SYRINGE | INTRAVENOUS | Status: AC
  Filled 2024-06-15: qty 10

## 2024-06-15 MED ORDER — GLYCOPYRROLATE 0.2 MG/ML IJ SOLN
INTRAMUSCULAR | Status: AC
  Filled 2024-06-15: qty 1

## 2024-06-15 MED ORDER — DEXAMETHASONE SODIUM PHOSPHATE 10 MG/ML IJ SOLN
INTRAMUSCULAR | Status: AC
  Filled 2024-06-15: qty 1

## 2024-06-15 SURGICAL SUPPLY — 28 items
BAG COUNTER SPONGE SURGICOUNT (BAG) IMPLANT
BENZOIN TINCTURE PRP APPL 2/3 (GAUZE/BANDAGES/DRESSINGS) ×1 IMPLANT
BLADE SURG 15 STRL LF DISP TIS (BLADE) ×1 IMPLANT
CHLORAPREP W/TINT 26 (MISCELLANEOUS) ×1 IMPLANT
COVER SURGICAL LIGHT HANDLE (MISCELLANEOUS) ×1 IMPLANT
DRAIN PENROSE 0.5X18 (DRAIN) ×1 IMPLANT
DRAPE LAPAROSCOPIC ABDOMINAL (DRAPES) ×1 IMPLANT
ELECT REM PT RETURN 15FT ADLT (MISCELLANEOUS) ×1 IMPLANT
GAUZE SPONGE 4X4 12PLY STRL (GAUZE/BANDAGES/DRESSINGS) IMPLANT
GLOVE BIO SURGEON STRL SZ 6 (GLOVE) ×1 IMPLANT
GLOVE INDICATOR 6.5 STRL GRN (GLOVE) ×1 IMPLANT
GOWN STRL REUS W/ TWL LRG LVL3 (GOWN DISPOSABLE) ×1 IMPLANT
KIT BASIN OR (CUSTOM PROCEDURE TRAY) ×1 IMPLANT
KIT TURNOVER KIT A (KITS) ×1 IMPLANT
MARKER SKIN DUAL TIP RULER LAB (MISCELLANEOUS) ×1 IMPLANT
MESH ULTRAPRO 3X6 7.6X15CM (Mesh General) IMPLANT
NDL HYPO 22X1.5 SAFETY MO (MISCELLANEOUS) ×1 IMPLANT
NEEDLE HYPO 22X1.5 SAFETY MO (MISCELLANEOUS) ×1 IMPLANT
PACK GENERAL/GYN (CUSTOM PROCEDURE TRAY) ×1 IMPLANT
SPIKE FLUID TRANSFER (MISCELLANEOUS) ×1 IMPLANT
STRIP CLOSURE SKIN 1/2X4 (GAUZE/BANDAGES/DRESSINGS) ×1 IMPLANT
SUT ETHIBOND 0 MO6 C/R (SUTURE) ×1 IMPLANT
SUT MNCRL AB 4-0 PS2 18 (SUTURE) ×1 IMPLANT
SUT PDS AB 0 CT1 36 (SUTURE) ×2 IMPLANT
SUT VIC AB 3-0 SH 27XBRD (SUTURE) ×2 IMPLANT
SUT VICRYL 3 0 BR 18 UND (SUTURE) ×1 IMPLANT
SYR CONTROL 10ML LL (SYRINGE) ×1 IMPLANT
TOWEL OR 17X26 10 PK STRL BLUE (TOWEL DISPOSABLE) ×1 IMPLANT

## 2024-06-15 NOTE — H&P (Signed)
 Tina Olson [id number]   Referring Provider:  Claven Cumming, MD ID-Dr. Zulma Hitt Pulm-Dr. Maire Scot  Subjective   Chief Complaint: New Consultation   History of Present Illness:    Very pleasant 78 year old woman with history of anemia, atherosclerosis, COPD (last saw pulmonology on 10/07/2023, has follow-up appointment with them at the end of this month, not currently on any oxygen), hepatitis C (partially treated with Harvoni  with negative follow-up labs), HIV (on no meds since mid 90s), hypertension, internal hemorrhoids, GERD who presents for evaluation of a right inguinal hernia. No previous abdominal surgery.  Hernia has been present for several years but has continued to increase in size and causes episodes of pain.  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  Does have chronic constipation.  Open repair was planned and attempted, but case was unable to proceed due to no IV access despite numerous attempts with ultrasound by CRNA and attending anesthesiologist, and plan was to reschedule with coordination with IR for access prior to the case.  Since that time she was able to undergo endoscopy and states that they were able to establish an IV for that.  Labs 03/10/24 include unremarkable CMP, lipid panel; CBC notable for white count of 2.7 and CD4/helper cells also noted to be low.  She saw Dr. Alwin Baars last on 4/8-plan was continued monitoring off ART  CT scan done in December 2020 is the last abdominal imaging she has, I have re-reviewed this and this does demonstrate bilateral fat-containing inguinal hernias, right greater than left.  At her last visit with me 4 years ago, the left side was asymptomatic/not palpable and the right side was reducible.  Review of Systems: A complete review of systems was obtained from the patient.  I  have reviewed this information and discussed as appropriate with the patient.  See HPI as well for other ROS.   Medical History: Past Medical History:  Diagnosis Date   COPD (chronic obstructive pulmonary disease) (CMS/HHS-HCC)    GERD (gastroesophageal reflux disease)     There is no problem list on file for this patient.   Past Surgical History:  Procedure Laterality Date   CATARACT EXTRACTION       Allergies  Allergen Reactions   Iodinated Contrast Media Hives and Itching   Lisinopril  Cough and Itching   Other Hives, Itching and Other (See Comments)    Seafood crustaceans  "itchy throat"   Seafood crustaceans  "itchy throat"    Current Outpatient Medications on File Prior to Visit  Medication Sig Dispense Refill   acetaminophen  (TYLENOL ) 325 MG tablet Take 325 mg by mouth     amLODIPine  (NORVASC ) 10 MG tablet      aspirin 81 MG chewable tablet Take 81 mg by mouth once daily     azaTHIOprine  (IMURAN ) 50 mg tablet Take 50 mg by mouth once daily     azelastine  (ASTELIN ) 137 mcg nasal spray Place 2 sprays into one nostril     benzonatate  (TESSALON ) 100 MG capsule Take 100 mg by mouth every 8 (eight) hours     calcium  carbonate 600 mg calcium  (1,500 mg) Tab tablet Take 1,500 mg by mouth     docusate (COLACE) 100 MG capsule Take 100 mg by mouth     famotidine (PEPCID) 40 MG tablet Take 40 mg by  mouth 2 (two) times daily     ferrous sulfate  325 (65 FE) MG tablet Take 325 mg by mouth once daily     fluticasone  propionate (FLONASE ) 50 mcg/actuation nasal spray 1 spray     hydrocortisone  (ANUSOL -HC) 25 mg suppository Place 25 mg rectally     pantoprazole  (PROTONIX ) 40 MG DR tablet Take 1 tablet by mouth 2 (two) times daily     polyethylene glycol (MIRALAX ) packet Take 17 g by mouth     rosuvastatin  (CRESTOR ) 20 MG tablet      SPIRIVA  RESPIMAT 2.5 mcg/actuation inhalation spray      No current facility-administered medications on file prior to visit.    Family History   Problem Relation Age of Onset   High blood pressure (Hypertension) Mother      Social History   Tobacco Use  Smoking Status Never  Smokeless Tobacco Never     Social History   Socioeconomic History   Marital status: Widowed  Tobacco Use   Smoking status: Never   Smokeless tobacco: Never  Substance and Sexual Activity   Alcohol use: Never   Drug use: Never   Social Drivers of Corporate investment banker Strain: Low Risk  (02/26/2023)   Received from [facility]   Overall Financial Resource Strain (CARDIA)    Difficulty of Paying Living Expenses: Not hard at all  Food Insecurity: No Food Insecurity (02/26/2023)   Received from [facility]   Hunger Vital Sign    Worried About Running Out of Food in the Last Year: Never true    Ran Out of Food in the Last Year: Never true  Transportation Needs: No Transportation Needs (02/26/2023)   Received from [facility] - [facility]    Lack of Transportation (Medical): No    Lack of Transportation (Non-Medical): No  Physical Activity: Inactive (02/26/2023)   Received from [facility]   Exercise Vital Sign    Days of Exercise per Week: 0 days    Minutes of Exercise per Session: 0 min  Stress: No Stress Concern Present (02/26/2023)   Received from [facility] - Occupational Stress Questionnaire    Feeling of Stress : Not at all  Social Connections: Moderately Integrated (02/26/2023)   Received from [facility]   Social Connection and Isolation Panel [NHANES]    Frequency of Communication with Friends and Family: More than three times a week    Frequency of Social Gatherings with Friends and Family: More than three times a week    Attends Religious Services: More than 4 times per year    Active Member of [employer] or Organizations: Yes    Attends Banker Meetings: More than 4 times per year    Marital Status: Widowed  Housing Stability: Unknown (05/14/2024)   Housing  Stability Vital Sign    Homeless in the Last Year: No    Objective:    Vitals:   05/14/24 0940  BP: 128/60  Pulse: 66  Temp: 36.5 C (97.7 F)  SpO2: 96%  Weight: 56.9 kg (125 lb 6.4 oz)  Height: 152.4 cm (5')  PainSc: 0-No pain  PainLoc: Abdomen    Body mass index is 24.49 kg/m.  Gen: A&Ox3, no distress  Chest: respiratory effort is normal. Abdomen: soft, nondistended, nontender.  Reducible right inguinal hernia.  No palpable hernia on the left. Neuro: no gross deficit Psych: appropriate mood and affect, normal insight/judgment intact  Skin: warm  and dry  Assessment and Plan:  Diagnoses and all orders for this visit:  Non-recurrent bilateral inguinal hernia without obstruction or gangrene -     Ambulatory Referral to Interventional Radiology  Left side remains asymptomatic and nonpalpable on exam.  I do recommend repair of the right side given ongoing symptoms of pain and protrusion.  Increasing in size. We discussed the relevant anatomy and we discussed options for repair.  I recommend an open approach and went over the technique of the procedure.  Discussed risks of bleeding, infection, pain, scarring, injury to structures in the area including nerves, blood vessels, bowel, or bladder; risk of chronic pain, hernia recurrence, risk of seroma or hematoma, urinary retention, and risks of general anesthesia including cardiovascular, pulmonary, and thromboembolic complications.  We discussed typical postop recovery, timeline, and activity limitations.  We also discussed the option of ongoing observation, with high rate of ultimately returning for surgery and risk of increasing size/symptoms from the hernia as well as incarceration/strangulation and went over symptoms that should prompt the patient to seek emergency treatment.  Questions were welcomed and answered to the patient's satisfaction. Patient wishes to proceed with scheduling, anticipate early July.   - Pulmonology  clearance-she will see Dr. Bertrum Brodie at the end of this month - Will make referral for IR eval for preoperative IV access and coordinate this for day of surgery.  While they were able to establish IV access for her endoscopy last fall, would not want to risk putting her through what she went through when we attempted surgery a few years ago.     Aldon Hung MD FACS

## 2024-06-15 NOTE — Op Note (Signed)
 Operative Note  Tina Olson  [id number]  [id number]  06/15/2024   Surgeon: Mitzie DELENA Freund MD FACS   Procedure performed: Open right inguinal hernia repair with mesh   Preop diagnosis:  right inguinal hernia   Post-op diagnosis/intraop findings: indirect inguinal hernia   Specimens: none   EBL: 5cc   Complications: none   Description of procedure: After confirming informed consent, the patient was taken to the operating room and placed supine on operating room table where general anesthesia was initiated, preoperative antibiotics were administered, SCDs applied, and a formal timeout was performed. The groin was clipped, prepped and draped in the usual sterile fashion. An oblique incision was made the just above the inguinal ligament after infiltrating the tissues with local anesthetic. Soft tissues were dissected using electrocautery until the external oblique aponeurosis was encountered. This was divided sharply to expand the external ring. A plane was bluntly developed between the hernia sac/round ligament and the external oblique.  The round ligament was then bluntly dissected away from the pubic tubercle and encircled with a Penrose. Inspection of the inguinal anatomy revealed a moderate indirect hernia sac. The indirect hernia sac was bluntly dissected away from the round ligament and skeletonized to the level of the internal ring, where it was reduced intact into the abdomen.  The round ligament was then ligated with 3-0 Vicryl ties and divided.  The inguinal floor was reconstructed with interrupted 0 PDS, obliterating the internal ring. A 3 x 6 piece of ultra Pro mesh was brought onto the field and trimmed to approximate the field. This was sutured to the pubic tubercle fascia, inferior shelving edge and to the internal oblique superiorly with interrupted 0 ethibonds.  The mesh was positioned flush beneath the external oblique aponeurosis.  Additional 0 Ethibonds were used to affix  the mesh to the inguinal floor. Hemostasis was ensured within the wound. The external oblique aponeurosis was reapproximated with a running 3-0 Vicryl to re-create a narrowed external ring. More local was infiltrated around the pubic tubercle and in the plane just below the external oblique. The Scarpa's was reapproximated with interrupted 3-0 Vicryls. The skin was closed with a running subcuticular 4-0 Monocryl. The remainder of the local was injected in the subcutaneous and subcuticular space. The field was then cleaned, benzoin and Steri-Strips and sterile bandage were applied. The patient was then awakened extubated and taken to PACU in stable condition.    All counts were correct at the completion of the case

## 2024-06-15 NOTE — Anesthesia Procedure Notes (Signed)
 Procedure Name: Intubation Date/Time: 06/15/2024 8:21 AM  Performed by: Obadiah Reyes BROCKS, CRNAPre-anesthesia Checklist: Patient identified, Emergency Drugs available, Suction available and Patient being monitored Patient Re-evaluated:Patient Re-evaluated prior to induction Oxygen Delivery Method: Circle System Utilized Preoxygenation: Pre-oxygenation with 100% oxygen Induction Type: IV induction Ventilation: Mask ventilation without difficulty Laryngoscope Size: Miller and 2 Grade View: Grade I Tube type: Oral Number of attempts: 1 Airway Equipment and Method: Stylet and Oral airway Placement Confirmation: ETT inserted through vocal cords under direct vision, positive ETCO2 and breath sounds checked- equal and bilateral Secured at: 21 cm Tube secured with: Tape Dental Injury: Teeth and Oropharynx as per pre-operative assessment

## 2024-06-15 NOTE — Transfer of Care (Signed)
 Immediate Anesthesia Transfer of Care Note  Patient: Tina Olson  Procedure(s) Performed: REPAIR, HERNIA, INGUINAL, ADULT (Right)  Patient Location: PACU  Anesthesia Type:General  Level of Consciousness: sedated and responds to stimulation  Airway & Oxygen Therapy: Patient Spontanous Breathing and Patient connected to face mask oxygen  Post-op Assessment: Report given to RN and Post -op Vital signs reviewed and stable  Post vital signs: Reviewed and stable  Last Vitals:  Vitals Value Taken Time  BP 121/59 06/15/24 09:26  Temp  36.8  Pulse 83 06/15/24 09:30  Resp 20 06/15/24 09:30  SpO2 100 % 06/15/24 09:30  Vitals shown include unfiled device data.  Last Pain:  Vitals:   06/15/24 0733  TempSrc: Oral  PainSc:          Complications: No notable events documented.

## 2024-06-15 NOTE — Discharge Instructions (Signed)
HERNIA REPAIR: POST OP INSTRUCTIONS   EAT Gradually transition to a high fiber diet with a fiber supplement over the next few weeks after discharge.  Start with a pureed / full liquid diet (see below)  WALK Walk an hour a day (cumulative- not all at once).  Control your pain to do that.    CONTROL PAIN Control pain so that you can walk, sleep, tolerate sneezing/coughing, and go up/down stairs.  HAVE A BOWEL MOVEMENT DAILY Keep your bowels regular to avoid problems.  OK to try a laxative to override constipation.  OK to use an antidiarrheal to slow down diarrhea.  Call if not better after 2 tries  CALL IF YOU HAVE PROBLEMS/CONCERNS Call if you are still struggling despite following these instructions. Call if you have concerns not answered by these instructions  ######################################################################    DIET: Follow a light bland diet & liquids the first 24 hours after arrival home, such as soup, liquids, starches, etc.  Be sure to drink plenty of fluids.  Quickly advance to a usual solid diet within a few days.  Avoid fast food or heavy meals initially as you are more likely to get nauseated or have irregular bowels.  A low-sugar, high-fiber diet for the rest of your life is ideal.   Take your usually prescribed home medications unless otherwise directed.  PAIN CONTROL: Pain is best controlled by a usual combination of three different methods TOGETHER: Ice/Heat Over the counter pain medication Prescription pain medication Most patients will experience some swelling and bruising around the hernia(s) such as the bellybutton, groins, or old incisions.  Ice packs or heating pads (30-60 minutes up to 6 times a day) will help. Use ice for the first few days to help decrease swelling and bruising, then switch to heat to help relax tight/sore spots and speed recovery.  Some people prefer to use ice alone, heat alone, alternating between ice & heat.  Experiment  to what works for you.  Swelling and bruising can take several weeks to resolve.   It is helpful to take an over-the-counter pain medication regularly for the first days: Naproxen (Aleve, etc)  Two 220mg tabs twice a day OR Ibuprofen (Advil, etc) Three 200mg tabs four times a day (every meal & bedtime) AND Acetaminophen (Tylenol, etc) 325-650mg four times a day (every meal & bedtime) A  prescription for pain medication should be given to you upon discharge.  Take your pain medication as prescribed, IF NEEDED.  If you are having problems/concerns with the prescription medicine (does not control pain, nausea, vomiting, rash, itching, etc), please call us [phone] to see if we need to switch you to a different pain medicine that will work better for you and/or control your side effect better. If you need a refill on your pain medication, please contact your pharmacy.  They will contact our office to request authorization. Prescriptions will not be filled after 5 pm or on week-ends.  Avoid getting constipated.  Between the surgery and the pain medications, it is common to experience some constipation.  Increasing fluid intake and taking a fiber supplement (such as Metamucil, Citrucel, FiberCon, MiraLax, etc) 1-2 times a day regularly will usually help prevent this problem from occurring.  A mild laxative (prune juice, Milk of Magnesia, MiraLax, etc) should be taken according to package directions if there are no bowel movements after 48 hours.    Wash / shower every day, starting 2 days after surgery.  You may shower over   the steri strips or skin glue which are waterproof.  No rubbing, scrubbing, lotions or ointments to incision(s). Do not soak or submerge.   Remove your outer bandage 2 days after surgery. Steri strips (if present) will peel off after 1-2 weeks. Glue (if present) will flake off after about 2 weeks.  You may leave the incision open to air.  You may replace a dressing/Band-Aid to cover  an incision for comfort if you wish.  Continue to shower over incision(s) after the dressing is off.  ACTIVITIES as tolerated:   You may resume regular (light) daily activities beginning the next day--such as daily self-care, walking, climbing stairs--gradually increasing activities as tolerated.  Control your pain so that you can walk an hour a day.  If you can walk 30 minutes without difficulty, it is safe to try more intense activity such as jogging, treadmill, bicycling, low-impact aerobics, swimming, etc. Refrain from the most intensive and strenuous activity such as sit-ups, heavy lifting, contact sports, etc  Refrain from any heavy lifting or straining until 6 weeks after surgery.   DO NOT PUSH THROUGH PAIN.  Let pain be your guide: If it hurts to do something, don't do it.  Pain is your body warning you to avoid that activity for another week until the pain goes down. You may drive when you are no longer taking prescription pain medication, you can comfortably wear a seatbelt, and you can safely maneuver your car and apply brakes. You may have sexual intercourse when it is comfortable.   FOLLOW UP in our office Please call [facility] at [phone] to set up an appointment to see your surgeon in the office for a follow-up appointment approximately 2-3 weeks after your surgery. Make sure that you call for this appointment the day you arrive home to insure a convenient appointment time.  9.  If you have disability of FMLA / Family leave forms, please bring the forms to the office for processing.  (do not give to your surgeon).  WHEN TO CALL US [phone]: Poor pain control Reactions / problems with new medications (rash/itching, nausea, etc)  Fever over 101.5 F (38.5 C) Inability to urinate Nausea and/or vomiting Worsening swelling or bruising Continued bleeding from incision. Increased pain, redness, or drainage from the incision   The clinic staff is available to answer your  questions during regular business hours (8:30am-5pm).  Please don't hesitate to call and ask to speak to one of our nurses for clinical concerns.   If you have a medical emergency, go to the nearest emergency room or call 911.  A surgeon from [facility] is always on call at the hospitals in [place]  [facility] [address], [address], [place], [place]  [address] ?  [address] [address], [place], [place]   [address] MAIN: [phone] ? TOLL FREE: [phone] ? FAX: [phone] [email or web address]  

## 2024-06-16 ENCOUNTER — Encounter (HOSPITAL_COMMUNITY): Payer: Self-pay | Admitting: Surgery

## 2024-06-16 NOTE — Anesthesia Postprocedure Evaluation (Signed)
 Anesthesia Post Note  Patient: Tina Olson  Procedure(s) Performed: REPAIR, HERNIA, INGUINAL, ADULT (Right)     Patient location during evaluation: PACU Anesthesia Type: General Level of consciousness: awake Pain management: pain level controlled Vital Signs Assessment: post-procedure vital signs reviewed and stable Respiratory status: spontaneous breathing, nonlabored ventilation and respiratory function stable Cardiovascular status: blood pressure returned to baseline and stable Postop Assessment: no apparent nausea or vomiting Anesthetic complications: no   No notable events documented.  Last Vitals:  Vitals:   06/15/24 1015 06/15/24 1030  BP: 115/71 116/69  Pulse: 78 78  Resp: 18   Temp: (!) 36.3 C (!) 36.3 C  SpO2: 93% 93%    Last Pain:  Vitals:   06/15/24 1030  TempSrc:   PainSc: 0-No pain                 [name]

## 2024-08-19 ENCOUNTER — Other Ambulatory Visit: Payer: Self-pay | Admitting: Family Medicine

## 2024-08-19 DIAGNOSIS — Z1231 Encounter for screening mammogram for malignant neoplasm of breast: Secondary | ICD-10-CM

## 2024-09-09 ENCOUNTER — Ambulatory Visit
Admission: RE | Admit: 2024-09-09 | Discharge: 2024-09-09 | Disposition: A | Discharge status: Home / Self Care | Source: Ambulatory Visit | Attending: Family Medicine | Admitting: Family Medicine

## 2024-09-09 DIAGNOSIS — Z1231 Encounter for screening mammogram for malignant neoplasm of breast: Secondary | ICD-10-CM

## 2024-09-16 ENCOUNTER — Other Ambulatory Visit (INDEPENDENT_AMBULATORY_CARE_PROVIDER_SITE_OTHER)

## 2024-09-16 DIAGNOSIS — Z21 Asymptomatic human immunodeficiency virus [HIV] infection status: Secondary | ICD-10-CM | POA: Diagnosis not present

## 2024-09-16 LAB — T-HELPER CELL (CD4) - (RCID CLINIC ONLY)
CD4 % Helper T Cell: 31 % — ABNORMAL LOW (ref 33–65)
CD4 T Cell Abs: 312 /uL — ABNORMAL LOW (ref 400–1790)

## 2024-09-16 NOTE — Addendum Note (Signed)
 Addended by: ANTONE DWAYNE SAILOR on: 09/16/2024 10:40 AM   Modules accepted: Orders

## 2024-09-17 LAB — CBC
Hematocrit: 36.5 % (ref 34.0–46.6)
Hemoglobin: 11.9 g/dL (ref 11.1–15.9)
MCH: 31 pg (ref 26.6–33.0)
MCHC: 32.6 g/dL (ref 31.5–35.7)
MCV: 95 fL (ref 79–97)
Platelets: 135 x10E3/uL — ABNORMAL LOW (ref 150–450)
RBC: 3.84 x10E6/uL (ref 3.77–5.28)
RDW: 13.9 % (ref 11.7–15.4)
WBC: 2.5 x10E3/uL — CL (ref 3.4–10.8)

## 2024-09-18 LAB — HIV-1 RNA QUANT-NO REFLEX-BLD
HIV-1 RNA Viral Load Log: 2.204 {Log_copies}/mL
HIV-1 RNA Viral Load: 160 {copies}/mL

## 2024-09-23 ENCOUNTER — Other Ambulatory Visit: Payer: Self-pay | Admitting: Gastroenterology

## 2024-10-06 ENCOUNTER — Other Ambulatory Visit: Payer: Self-pay | Admitting: *Deleted

## 2024-10-06 ENCOUNTER — Encounter: Payer: Self-pay | Admitting: Infectious Diseases

## 2024-10-06 ENCOUNTER — Other Ambulatory Visit: Payer: Self-pay

## 2024-10-06 ENCOUNTER — Ambulatory Visit (INDEPENDENT_AMBULATORY_CARE_PROVIDER_SITE_OTHER): Admitting: Infectious Diseases

## 2024-10-06 VITALS — BP 150/82 | HR 67 | Temp 97.5°F | Ht 60.0 in | Wt 125.8 lb

## 2024-10-06 DIAGNOSIS — Z113 Encounter for screening for infections with a predominantly sexual mode of transmission: Secondary | ICD-10-CM

## 2024-10-06 DIAGNOSIS — Z21 Asymptomatic human immunodeficiency virus [HIV] infection status: Secondary | ICD-10-CM | POA: Diagnosis not present

## 2024-10-06 DIAGNOSIS — J432 Centrilobular emphysema: Secondary | ICD-10-CM | POA: Diagnosis not present

## 2024-10-06 DIAGNOSIS — Z23 Encounter for immunization: Secondary | ICD-10-CM

## 2024-10-06 DIAGNOSIS — Z79899 Other long term (current) drug therapy: Secondary | ICD-10-CM

## 2024-10-06 DIAGNOSIS — I10 Essential (primary) hypertension: Secondary | ICD-10-CM

## 2024-10-06 DIAGNOSIS — D509 Iron deficiency anemia, unspecified: Secondary | ICD-10-CM | POA: Diagnosis not present

## 2024-10-06 DIAGNOSIS — Z87891 Personal history of nicotine dependence: Secondary | ICD-10-CM

## 2024-10-06 DIAGNOSIS — B20 Human immunodeficiency virus [HIV] disease: Secondary | ICD-10-CM

## 2024-10-06 MED ORDER — FERROUS SULFATE 325 (65 FE) MG PO TABS: 325.0000 mg | ORAL_TABLET | Freq: Every day | ORAL | 0 refills | Status: AC

## 2024-10-06 NOTE — Assessment & Plan Note (Signed)
 She is doing well Off ART Suggested she start art but she defers Her CD4 is well maintinaed and her Vl is low.  (Thought probably off guidelines) Her vax are uptodate Will see her in 9 months.

## 2024-10-06 NOTE — Assessment & Plan Note (Signed)
 Have asked Pulm to see her and repeat her Ct

## 2024-10-06 NOTE — Addendum Note (Signed)
 Addended by: ANTONE DWAYNE SAILOR on: 10/06/2024 03:59 PM   Modules accepted: Orders

## 2024-10-06 NOTE — Assessment & Plan Note (Signed)
 Reinforced her vitamin regiemen.

## 2024-10-06 NOTE — Assessment & Plan Note (Signed)
 Previously well controlled She attributes this to needing another blood draw today.

## 2024-10-06 NOTE — Progress Notes (Signed)
 Subjective:    Patient ID: Tina Olson, female  DOB: Apr 18, 1946, 78 y.o.        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 Was started on harvoni  in July 2015 for her Hep C (VL 6.5 million) and F0 elastography/ultrasound. She was not able to tolerate this (thinks she took ~ 1/2 of the rx, didn't get last bottle). Her repeat Hep C RNA were negative (10-04-14) (06-27-15)   She has had 2 prev colonoscopies. (-).  She has seen optho for pseudophakia, cataracts, focal chorioretinal inflammation. She feels like her vision is good, she takes rx (imuran , valtrex) for this.     No ART.   She is off home O2 at night for her suspcted LD/COPD  (which she is followed by rheum, pulmonary for). States she uses very little.  She has repeat CT scan 09-2023- evolving lung disease. Last Pulm visit July. Has not had her repeat this year.      Prev breast bx 2009- calcifications.  Mammo 09-09-2024; (-) Birads 1.    last COVID 04-2021 at her PCP. Has had RSV vaccine prior at [facility].  Got flu shot today.     EGD 9-27 showed chronic active gastritits and H pylori stains (-). She has been having worsening sx of GERD. Taking peptobismal, myalnta, ginger ale, beano gaviscon. Pepcid, protonix .  She has also been on famotidine. Has been eating a bland diet. Chicken <- baked -> pork chops. Scrambled eggs. Ice cream is what keeps my wt.  Prev saw Dr Shila. Also on protonix .  Wakes up with some hoarseness that improves through the day.   Today is feeling well, has learned how to eat with the gerd.  She had R inguinal hernia repair 06-15-24 (with mesh). Still healing inside. On no pain meds.   No etoh/tobacco. Quit tobacco ~ 2008. < 1/2 ppd, started when she was a teenager. Quit in her 9s.   HIV 1 RNA Quant  Date Value  02/15/2022 31 Copies/mL (H)  05/18/2021 118 copies/mL (H)  11/24/2020 93 Copies/mL (H)   HIV-1 RNA Viral Load (copies/mL)  Date Value   09/16/2024 160  03/10/2024 170  09/10/2023 70   CD4 T Cell Abs (/uL)  Date Value  09/16/2024 312 (L)  03/10/2024 293 (L)  09/10/2023 237 (L)     Health Maintenance  Topic Date Due  . Zoster Vaccines- Shingrix (1 of 2) Never done  . DTaP/Tdap/Td (2 - Td or Tdap) 08/23/2021  . Medicare Annual Wellness (AWV)  02/26/2024  . Influenza Vaccine  07/10/2024  . COVID-19 Vaccine (8 - 2025-26 season) 08/10/2024  . Pneumococcal Vaccine: 50+ Years  Completed  . DEXA SCAN  Completed  . Hepatitis C Screening  Completed  . Meningococcal B Vaccine  Aged Out  . Lung Cancer Screening  Discontinued  . Hepatitis B Vaccines 19-59 Average Risk  Discontinued  . Mammogram  Discontinued  . Colonoscopy  Discontinued      Review of Systems  Constitutional:  Negative for chills, fever and weight loss.  Respiratory:  Negative for cough and shortness of breath.   Gastrointestinal:  Positive for constipation. Negative for diarrhea.  Genitourinary:  Negative for dysuria.  Psychiatric/Behavioral:  The patient does not have insomnia.     Please see HPI. All other systems reviewed and negative.     Objective:  Physical Exam Vitals reviewed.  Constitutional:      Appearance: Normal appearance.  HENT:     Mouth/Throat:     Mouth: Mucous membranes are moist.     Pharynx: No oropharyngeal exudate.  Eyes:     Extraocular Movements: Extraocular movements intact.     Pupils: Pupils are equal, round, and reactive to light.  Cardiovascular:     Rate and Rhythm: Normal rate and regular rhythm.  Pulmonary:     Effort: Pulmonary effort is normal.     Breath sounds: Normal breath sounds.  Abdominal:     General: Bowel sounds are normal. There is no distension.     Palpations: Abdomen is soft.     Tenderness: There is no abdominal tenderness.  Musculoskeletal:        General: Normal range of motion.     Cervical back: Normal range of motion and neck supple.     Right lower leg: No edema.     Left  lower leg: No edema.  Neurological:     General: No focal deficit present.     Mental Status: She is alert.           Assessment & Plan:

## 2024-10-07 ENCOUNTER — Telehealth: Payer: Self-pay | Admitting: Internal Medicine

## 2024-10-07 LAB — COMPREHENSIVE METABOLIC PANEL WITH GFR
ALT: 22 IU/L (ref 0–32)
AST: 27 IU/L (ref 0–40)
Albumin: 4.1 g/dL (ref 3.8–4.8)
Alkaline Phosphatase: 66 IU/L (ref 49–135)
BUN/Creatinine Ratio: 15 (ref 12–28)
BUN: 13 mg/dL (ref 8–27)
Bilirubin Total: 0.5 mg/dL (ref 0.0–1.2)
CO2: 19 mmol/L — ABNORMAL LOW (ref 20–29)
Calcium: 9.9 mg/dL (ref 8.7–10.3)
Chloride: 105 mmol/L (ref 96–106)
Creatinine, Ser: 0.89 mg/dL (ref 0.57–1.00)
Globulin, Total: 3.8 g/dL (ref 1.5–4.5)
Glucose: 121 mg/dL — ABNORMAL HIGH (ref 70–99)
Potassium: 4.1 mmol/L (ref 3.5–5.2)
Sodium: 137 mmol/L (ref 134–144)
Total Protein: 7.9 g/dL (ref 6.0–8.5)
eGFR: 67 mL/min/1.73 (ref >59)

## 2024-10-07 NOTE — Telephone Encounter (Signed)
 Pls ensure followup wit me or APP in Jan 2026. Infectious diseases wants to ensure there is followup. 15 min fine

## 2024-10-26 ENCOUNTER — Encounter

## 2024-10-26 ENCOUNTER — Ambulatory Visit: Admitting: Internal Medicine

## 2024-11-21 ENCOUNTER — Ambulatory Visit (HOSPITAL_BASED_OUTPATIENT_CLINIC_OR_DEPARTMENT_OTHER): Admission: RE | Admit: 2024-11-21 | Discharge: 2024-11-21 | Attending: Internal Medicine | Admitting: Internal Medicine

## 2024-11-21 DIAGNOSIS — Z87891 Personal history of nicotine dependence: Secondary | ICD-10-CM

## 2024-11-21 DIAGNOSIS — J439 Emphysema, unspecified: Secondary | ICD-10-CM

## 2024-11-21 DIAGNOSIS — R918 Other nonspecific abnormal finding of lung field: Secondary | ICD-10-CM

## 2024-11-21 DIAGNOSIS — R7689 Other specified abnormal immunological findings in serum: Secondary | ICD-10-CM

## 2024-12-02 ENCOUNTER — Ambulatory Visit: Payer: Self-pay | Admitting: Internal Medicine

## 2024-12-02 NOTE — Progress Notes (Signed)
 ILD present. STable over time. See you Jan 2026 visit

## 2024-12-15 ENCOUNTER — Ambulatory Visit: Admitting: Internal Medicine

## 2024-12-15 ENCOUNTER — Encounter

## 2024-12-25 ENCOUNTER — Ambulatory Visit

## 2024-12-25 DIAGNOSIS — J439 Emphysema, unspecified: Secondary | ICD-10-CM | POA: Diagnosis not present

## 2024-12-25 DIAGNOSIS — Z87891 Personal history of nicotine dependence: Secondary | ICD-10-CM

## 2024-12-25 DIAGNOSIS — R918 Other nonspecific abnormal finding of lung field: Secondary | ICD-10-CM

## 2024-12-25 DIAGNOSIS — R7689 Other specified abnormal immunological findings in serum: Secondary | ICD-10-CM

## 2024-12-25 LAB — PULMONARY FUNCTION TEST
DL/VA % pred: 55 %
DL/VA: 2.35 ml/min/mmHg/L
DLCO unc % pred: 51 %
DLCO unc: 8.47 ml/min/mmHg
FEF 25-75 Pre: 2.4 L/s
FEF2575-%Pred-Pre: 173 %
FEV1-%Pred-Pre: 120 %
FEV1-Pre: 2.06 L
FEV1FVC-%Pred-Pre: 109 %
FEV6-%Pred-Pre: 116 %
FEV6-Pre: 2.53 L
FEV6FVC-%Pred-Pre: 105 %
FVC-%Pred-Pre: 109 %
FVC-Pre: 2.53 L
Pre FEV1/FVC ratio: 82 %
Pre FEV6/FVC Ratio: 100 %

## 2024-12-25 NOTE — Progress Notes (Signed)
Spiro/DLCO performed today. 

## 2024-12-25 NOTE — Patient Instructions (Signed)
Spiro/DLCO performed today. 

## 2024-12-29 ENCOUNTER — Encounter: Payer: Self-pay | Admitting: Internal Medicine

## 2024-12-29 ENCOUNTER — Ambulatory Visit: Admitting: Internal Medicine

## 2024-12-29 VITALS — BP 122/68 | HR 57 | Temp 98.1°F | Ht 60.0 in | Wt 127.6 lb

## 2024-12-29 DIAGNOSIS — R918 Other nonspecific abnormal finding of lung field: Secondary | ICD-10-CM

## 2024-12-29 DIAGNOSIS — J439 Emphysema, unspecified: Secondary | ICD-10-CM | POA: Diagnosis not present

## 2024-12-29 DIAGNOSIS — Z87891 Personal history of nicotine dependence: Secondary | ICD-10-CM | POA: Diagnosis not present

## 2024-12-29 NOTE — Patient Instructions (Addendum)
" °#   Emphysema - stable and well on spiriva   PLAN - Continue  low-dose Spiriva   1.25 mcg @ 2 puff  each tme but only once daily  #Interstial lung inflammation - clinically stable, Given  > 250  CD4 doubt HIV rlated lung inflammation. PFT better in Jan 2026   Plan - - control GERD - continue immuran for eye issue - continue albuterol  as needed - - do spiro dllco in 8 mnths    Followup -8 month routine 15 min followup  - walk test, symptom score at followup  - but after PFT   "

## 2024-12-29 NOTE — Progress Notes (Signed)
 "      OV 08/16/2022  Subjective:  Patient ID: Tina Olson, female , DOB: [birth year] , age 79 y.o. , MRN: [id number] , ADDRESS: [address] [place] [id number] PCP Haze Kingfisher, MD Patient Care Team: Haze Kingfisher, MD as PCP - General (Family Medicine) Eben Reyes BROCKS, MD as PCP - Infectious Diseases (Infectious Diseases)  This Provider for this visit: Treatment Team:  Attending Provider: Geronimo Amel, MD    08/16/2022 -   Chief Complaint  Patient presents with   Consult    Pt is here due to recent CT coronary results.  Pt states that she does have some SOB especially if she goes to the mailbox to bend down to pick something up.     HPI  [facility] Comprehensive ILD Questionnaire Yaa Donnellan 79 y.o. -79 y.o. 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 at. Patient has hx of pseudophakia both eyes s/p PCIOL OU with Dr. Octavia and inflammation in the left eye ever since cataract surgery on 06/22/19. She has hx of HIV since 1993 and hx of Syphilis with treatment over 10 year ago. Currently on Valtrex 1g daily and Azathioprine  50 mg daily. Sees Dr Paticia Blacker [facility]   Has HIV Hx of HIV since 1993 and hx of Syphilis over 10 years ago  -sees Dr Eben  She is a former smoker who undergoes lung cancer screening as well  She actually has interstitial lung disease mentioned on CT scans in 2020 but she has not been aware of this until she had a cardiac CT in 2023 August.  Therefore she has been referred here.  She tells me that she has had insidious onset of shortness of breath for at least a year and slowly getting worse.  Made worse with exertion and stooping forward.  Improved by rest but there is also variability to it and sometimes it is not there.        Past Medical History :  Is HIV positive and follows with Dr. Eben Has ophthalmologic issues and is immunosuppressed and is on Imuran  for at  least a few years.   Positive for longstanding history of acid reflux disease  Denies any history of asthma but somebody apparently told her she has COPD but she is not sure Denies any connective tissue disease or vasculitis Denies any diabetes or stroke or hepatitis or tuberculosis. Denies any kidney disease She has had the COVID-vaccine but has not had COVID.  ROS:  Positive for shortness of breath with exertion.  Not sure she has cough  FAMILY HISTORY of LUNG DISEASE:  -Her biologic second cousin Gaynell Pettyy has IPF and follows with Dr. Theophilus. With family history of asthma  PERSONAL EXPOSURE HISTORY:  She used to smoke at least 20 cigarettes a day.  She quit smoking sometime between 2018 2010.  No marijuana smoking no vaping no cocaine use.  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  She did use it quite a lot  HOME  EXPOSURE and HOBBY DETAILS :  Single-family home [place] the last 11 years.  Detail organic antigen exposure history in the house is negative  OCCUPATIONAL HISTORY (122 questions) : She grew up in [place] in her childhood although she was born in [place].  Then she moved to [place] for high school and she is lived here since.  She used to work in [employer]	[place]  and retired in 2010.  During this time was exposed to steam iron and cleaning equipment but no known organic or inorganic antigen exposure.  PULMONARY TOXICITY HISTORY (27 items):  -Is on Imuran  for ophthalmology issues.  INVESTIGATIONS:    CT Chest data  No results found.    PFT     CT chest July 2021 -personally visualized.  Narrative & Impression  CLINICAL DATA:  79 year old asymptomatic female former smoker presents for follow-up of pulmonary nodules.   EXAM: CT CHEST WITHOUT CONTRAST   TECHNIQUE: Multidetector CT imaging of the chest was performed following the standard protocol without intravenous contrast. High resolution imaging of  the lungs, as well as inspiratory and expiratory imaging, was performed.   COMPARISON:  11/19/2019 CT abdomen/pelvis. 04/04/2020 chest radiograph.   FINDINGS: Cardiovascular: Normal heart size. No significant pericardial effusion/thickening. Three-vessel coronary atherosclerosis. Atherosclerotic nonaneurysmal thoracic aorta. Top-normal caliber main pulmonary artery (3.1 cm diameter).   Mediastinum/Nodes: No discrete thyroid  nodules. Unremarkable esophagus. No pathologically enlarged axillary, mediastinal or hilar lymph nodes, noting limited sensitivity for the detection of hilar adenopathy on this noncontrast study.   Lungs/Pleura: No pneumothorax. No pleural effusion. Moderate centrilobular and paraseptal emphysema. No acute consolidative airspace disease or lung masses. Previously visualized clustered nodular opacities in the peripheral basilar right lower lobe on 12/08/2019 CT abdomen study have resolved. No significant pulmonary nodules. No significant lobular air trapping or evidence of tracheobronchomalacia on the expiration sequence. Mild patchy subpleural reticulation and ground-glass opacity with subpleural lines in the lower lungs bilaterally without significant regions of traction bronchiectasis, architectural distortion or frank honeycombing. These findings of not substantially changed at the lung bases since 12/08/2019 CT abdomen study.   Upper abdomen: No acute abnormality.   Musculoskeletal: No aggressive appearing focal osseous lesions. Mild thoracic spondylosis.   IMPRESSION: 1. No significant pulmonary nodules. Previously visualized clustered nodular opacities in the peripheral basilar right lower lobe on 12/08/2019 CT abdomen study have resolved. 2. Mild patchy subpleural reticulation and ground-glass opacity with subpleural lines in the lower lungs bilaterally without bronchiectasis or honeycombing, unchanged since 12/08/2019 CT abdomen study. Mild  interstitial lung disease such as NSIP or early UIP not excluded. Findings are indeterminate for UIP per consensus guidelines: Diagnosis of Idiopathic Pulmonary Fibrosis: An Official ATS/ERS/JRS/ALAT Clinical Practice Guideline. Am JINNY Honey Crit Care Med Vol 198, Iss 5, [phone], Aug 10 2017. 3. Three-vessel coronary atherosclerosis. 4. Aortic Atherosclerosis (ICD10-I70.0) and Emphysema (ICD10-J43.9).     Electronically Signed   By: Selinda DELENA Blue M.D.   On: 06/16/2020 11:54     09/06/2022: Today-acute Patient presents today for acute visit.  She had contacted the office on 9/25 reported that she was having increased shortness of breath and fatigue.  Felt like her breathing was limiting what all she could do.  She was advised to go to the ED or come in for acute visit.  Today, she reports feeling relatively unchanged.  She cannot exactly identify when she started feeling worse after seeing Dr. Geronimo last.  Feels like it has been a few weeks.  She tells me that she just feels like she has progressively declined over the past 6 months to a year, and then within the past few weeks has noticed that she cannot walk as far without having to stop to rest.  She has an occasional dry cough.  Does feel like she has some chest congestion but cannot produce any sputum.  No significant wheezing.  She denies any fevers, night sweats, chills, hemoptysis,  calf pain or swelling.  She has been told in the past that she has COPD.  She has never been on any inhalers for this. Her autoimmune/ILD panel recently completed was significant for positive scleroderma antibody, ANA and rheumatoid factor.  She also had elevated ESR.  She denies any significant joint pain.  Occasionally has some mild pain in her right hip, which she reports is sciatica.  Denies any joint pain or swelling.  She occasionally has some numbness in her hands; does not feel like they are tight.  No significant skin discoloration or changes.  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  She was told by her ophthalmologist that it could possibly be related to an underlying autoimmune disease but she was never evaluated by rheumatology for this.  She is currently on Imuran    OV 10/09/2022  Subjective:  Patient ID: Tina Olson, female , DOB: 1945-12-12 , age 46 y.o. , MRN: [id number] , ADDRESS: [address] [place] [address] PCP Haze Kingfisher, MD Patient Care Team: Haze Kingfisher, MD as PCP - General (Family Medicine) Eben Reyes BROCKS, MD as PCP - Infectious Diseases (Infectious Diseases)  This Provider for this visit: Treatment Team:  Attending Provider: Geronimo Amel, MD    10/09/2022 -   Chief Complaint  Patient presents with   Office Visit    PFT today PT on 1.5L O2 at night, breathing better during the day    HPI Tina Olson 79 y.o. -returns for follow-up.  Here to go over test results.  Overall she feels stable.  I was fairly convinced based on low-dose CT scan of the chest that she had interstitial lung disease but she had high-resolution CT chest and thoracic radiologist Dr. Jeremy says she has emphysema and some very mild bronchiectasis.  There is no ILD.  Her pulmonary function test only shows slight isolated reduction in diffusion capacity but is otherwise normal.  I did share this news with her and she is quite excited about it.  However serology shows positivity for scleroderma antibody [she admits to thickening of the skin is on the dorsum of her palm], mild ANA positivity and mild rheumatoid factor positivity.  She is not aware of these from before.  She says she has a rheumatology appointment pending.  Review of the records indicate appointment Dr. Jeannetta in January 2024.  Of note: Because of emphysema last visit Katie Cobb put her on Spiriva  higher dose which she is supposed to take only 2 puffs once daily but she was taking 2 puffs 2 times daily and a  week after this even though she started feeling better from a shortness of breath perspective started have tingling and call 911 and stop.  She was not aware that she only needed to take it once daily.  After hearing that she is willing to take the low-dose only once daily and then reassess.     In terms of HIV she has upcoming appointment with Dr. Eben in December 2023.  Last CD4 count was in March 2023  She has SVTs and she seen Dr. Jerone in July 2023     OV 03/19/2023  Subjective:  Patient ID: Tina Olson, female , DOB: 1946/06/06 , age 18 y.o. , MRN: [id number] , ADDRESS: [address] [place] [id number] PCP Haze Kingfisher, MD Patient Care Team: Haze Kingfisher, MD as PCP - General (Family Medicine) Eben Reyes BROCKS, MD as PCP - Infectious Diseases (Infectious  Diseases)  This Provider for this visit: Treatment Team:  Attending Provider: Geronimo Amel, MD    03/19/2023 -   Chief Complaint  Patient presents with   Follow-up    Doing well.  Spiriva  helping with SOB.  Needs refill on Spiriva     HPI Tina Olson 79 y.o. -returns for follow-up.  From a shortness of breath perspective she is stable.  She says Ranell is actually helping her.  In fact symptom scores have improved compared to October 2023.  She says she is doing well.  Review of the records indicate January 2024 she saw Dr. Lonni Ester rheumatology who is reassured her about the absence of systemic connective tissue disease.  He felt that the autoimmune antibodies could be false positive potentially.  She is also seen Dr. Reyes Fenton of infectious diseases 02/26/2023 CD4 counts were normal upon my review.  Mid March 2024 she also saw Dr. Maree her ophthalmologist and has been reassured she did indicate that she is getting some bladder scan and abdominal imaging coming up.  But otherwise she is well.  Today on exam I did hear right greater than left lower lobe crackles.  But her  symptom score is better and her walking desaturation test is stable./Normal.  Of note she uses her night oxygen.  She wants to know if she can go to [place]  for a few days without portable oxygen on night oxygen.  I reassured her that she could do that.  She also thinks that at 8 PM she needs to put her night oxygen on.  I did indicate to her she only needs to do it when she is going to bed and she is allowed to go to church etc. without oxygen.  OV 10/07/2023  Subjective:  Patient ID: Tina Olson, female , DOB: [birth year] , age 55 y.o. , MRN: [id number] , ADDRESS: [address] [place] [address] PCP Haze Kingfisher, MD Patient Care Team: Haze Kingfisher, MD as PCP - General (Family Medicine) Fenton Reyes BROCKS, MD as PCP - Infectious Diseases (Infectious Diseases)  This Provider for this visit: Treatment Team:  Attending Provider: Geronimo Amel, MD    10/07/2023 -   Chief Complaint  Patient presents with   Pulmonary emphysema, unspecified emphysema type     HPI Tina Olson 79 y.o. -returns for follow-up.  Last seen 6 months ago.  Since then she says she has been more tired and short of breath.  No anemia issues.  She is had no other new medical problems no surgeries.  She did have endoscopy 09/06/2023 with Dr. Shila.  Apparently biopsies did not show any cancer.  She continues on Imuran  for her eye.  She had pulmonary function test and it is stable open with see below] exercise hypoxemia test is also stable.  She is not using her night oxygen and she wants to return.  She did have a high-resolution CT chest.  In the past the radiologist felt there was no ILD but when I looked at it there was groundglass opacities in the lower lobe.  However the official report is pending due to the shortage of radiologist.  I did indicate to her that our office will inform her of the CT reports but otherwise that she just see her in 6 months with repeat breathing  test.  She is up-to-date with the flu shot.  She is previous helping she also feels acid reflux therapy is helping.  OV 06/04/2024  Subjective:  Patient ID: Tina Olson, female , DOB: Mar 05, 1946 , age 77 y.o. , MRN: [id number] , ADDRESS: [address] [place] [address] PCP Haze Kingfisher, MD Patient Care Team: Haze Kingfisher, MD as PCP - General (Family Medicine) Eben Reyes BROCKS, MD as PCP - Infectious Diseases (Infectious Diseases)  This Provider for this visit: Treatment Team:  Attending Provider: Geronimo Amel, MD    06/04/2024 -   Chief Complaint  Patient presents with   Follow-up    PFT done 06/03/24. Breathing is doing well. She is scheduled to have hernia repair 06/15/24-needs risk assessment.     HPI Tina Olson 79 y.o. -returns for follow-up.  Last seen fall 2024.  Radiologist Dr. Jeremy felt at the time of last visit after the visit when we got the CT report that there was interstitial lung inflammation.  This was in variance with the October 2023 report.  In the interim from a shortness of breath perspective she feels stable.  She continues her Imuran  for her eye issues.  She continues to deal with acid reflux.  June 2025 saw GI and pantoprazole  was increased [external record reviewed.]  Chart review also shows a CD4 counts to be greater than 250.  She had pulmonary function test this visit and shows continued stability which is in variance with last chest CT report.  Her exercise sit/stand test is also normal.  She has been diagnosed with right inguinal hernia and she needs preop clearance.        OV 12/29/2024  Subjective:  Patient ID: Tina Olson, female , DOB: 1946/08/28 , age 10 y.o. 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  - Macular pucker oof left retinae  - hx of pseudophakia both eyes s/p PCIOL OU with Dr. Octavia and inflammation in the left eye ever since cataract surgery on 06/22/19.  - Currently on Valtrex 1g daily and Azathioprine  50 mg daily.   She has hx of HIV since 1993 andCD4 count 388 02/15/2022   hx of Syphilis with treatment over 10 year ago.   Has HIV Hx of HIV since 1993 and hx of Syphilis over 10 years ago  -sees Dr Eben  -CD4 353/5/24  - CD4 293 on/1/25  She is a former smoker > 30 ppd -  who undergoes lung cancer screening as well  Zio patch x2 weeks 05/04/2022 showed 37 episodes of SVT, longest lasting 17 beats with average rate 118 bpm, occasional PACs (1.4% of beats).  Echocardiogram 05/09/2022 showed EF 55 to 60%, mild LVH, normal diastolic function, normal RV function, no significant valvular disease  -Dr. 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 Lonni Ester early 2024 visit.   Concern for ILD but ILD ruled out October 2023  - interstitial lung disease mentioned on CT scans in 2020 but she has not been aware of this until she had  a cardiac CT in 2023 August.  -   She tells me that she has had insidious onset of shortness of breath since 2022 and progressive    12/29/2024 -   Chief Complaint  Patient presents with   Interstitial Lung Disease    Pt states since LOV breathing has been about the same SOB occurs w/ exertion      HPI Tina Olson 79 y.o. -returns for follow-up.  Since her last visit she has had a successful right hernia inguinal  surgery.  And she feels hernia still healing deep inside although the skin area has healed quite well.  She reports no other problems.  Her rheumatologist has reduced her Imuran  to 25 mg/day.  She states this people really helps her.  Her breathing has been stable since the last visit. She uses Spiriva , taking two puffs in the morning, and notices a significant difference if she forgets to take it, as it helps her feel 'all right for the rest of the day.' Her medication regimen has been adjusted, with her dose of Imuran  (AZA) reduced from 50 mg to 25 mg. She reports no issues with this change.  She experiences a rash on her back, which she attributes to dry skin. She describes it as itchy and has started using Eucerin cream to alleviate the dryness.  I recommended flaxseed.  She mentions having GERD.  CT scan of the chest I personally visualized.  To me this still is consistent with ILD even though radiology is calling it ILD.  It is mild.  Her pulmonary function test is slightly improved.  I do agree the ground glass component is resolved.   SYMPTOM SCALE - ILD 08/16/2022 10/09/2022  03/19/2023  12/29/2024   Current weight      O2 use ra ra ra ra  Shortness of Breath 0 -> 5 scale with 5 being worst (score 6 If unable to do)     At rest 0 2 0 1  Simple tasks - showers, clothes change, eating, shaving 0 3 1 2   Household (dishes, doing bed, laundry) 3 3 2.5 3  Shopping 2 4 2.5 3  Walking level at own pace 2 3 2 2   Walking up Stairs 3 4 2.5 3  Total (30-36) Dyspnea Score 10 18 10.5 14      Non-dyspnea symptoms (0-> 5 scale) 08/16/2022 10/09/2022  03/19/2023  12/29/2024   How bad is your cough? 0 0 2 1  How bad is your fatigue 2 4 2 2   How bad is nausea 0 2 0 0  How bad is vomiting?  0 0 0 0  How bad is diarrhea? 0 0 0 0  How bad is anxiety? 1 1 2  0  How bad is depression 2 1 1.5 0  Any chronic pain - if so where and how bad 0 x x 0         SIT STAND TEST - goal 15 times    06/04/2024  12/29/2024   O2 used ra ra  PRobe - finter or forehead finger Finger  Number sit and stand completed - goal 15 15 15   Time taken to complete 100 40.5 seconds  Resting Pulse Ox/HR/Dyspnea  100% and HR 53 and dspnea score of 1 98% with a heart rate of 55 and a score of 4  Peak measures 61005 and HR 53 and dhspnea score of 1 9 9% with a heart rate of 76 and a score 5.5  Final Pulse Ox/HR 100% and 57/min and dyspnea of 1/10 95% with a heart rate of 61 score 5  Desaturated </= 88% no   Desaturated <= 3% points no   Got Tachycardic >/= 90/min no   Miscellaneous comments Normal test     CT Chest data from date:   - personally visualized and independently interpreted : yes - my findings are: I think this is ILA Narrative & Impression  EXAM: HIGH RESOLUTION CHEST 11/21/2024 09:59:59 AM   TECHNIQUE: CT of the chest was performed without the administration of intravenous contrast. High resolution CT imaging was performed of the lungs. Multiplanar reformatted images are provided for review. Automated exposure control, iterative reconstruction, and/or weight based adjustment of the mA/kV was utilized to reduce the radiation dose to as low as reasonably achievable. High resolution CT images were performed in the supine inspiration, supine expiration, and prone inspiration positions.   COMPARISON: CT Chest High Resolution 10/02/2023.   CLINICAL HISTORY: Diffuse interstitial lung disease.   FINDINGS:   MEDIASTINUM AND LYMPH NODES: Heart and pericardium: 3-vessel coronary atherosclerosis. No mediastinal, hilar or axillary lymphadenopathy.   VASCULATURE: Atherosclerotic nonaneurysmal thoracic aorta.   HRCT FINDINGS AND LUNGS AND PLEURA: No pneumothorax. No pleural effusion. The central airways are clear. Moderate centrilobular and mild paraseptal emphysema. No acute consolidative airspace disease. No lung masses. A few small pulmonary nodules up to 4 mm along the left  major fissure on series 4, image 75 are all stable. No new significant pulmonary nodules. Mild patchy peripheral reticulation and ground-glass opacity throughout the lungs with associated minimal traction bronchiolectasis and architectural distortion. There are areas of subpleural sparing of the reticulation and ground-glass opacity. There is a basilar predominance of these findings. No frank honeycombing. The ground-glass opacity has decreased in the interval. The other findings are not substantially changed. No significant lobular air trapping or evidence of tracheobronchomalacia on the expiration sequence.   UPPER ABDOMEN: Simple 1.7 cm upper right renal cyst, for which no follow-up imaging is recommended .   SOFT TISSUES AND BONES: Mild thoracic spondylosis. No acute abnormality of the soft tissues.   IMPRESSION: 1. Spectrum of findings suggestive of mild basilar predominant perihilar interstitial lung disease with areas of subpleural sparing. Ground glass opacity has decreased in the interval. No no progression. No honeycombing. Imaging appearance favors NSIP. UIP not excluded, but less favored. Findings are indeterminate for UIP per consensus guidelines: Diagnosis of idiopathic pulmonary fibrosis: An official ats/ers/jrs/alat clinical practice guideline. Am j respir crit care med vol 198, iss 5, pp [phone], Aug 10 2017. 2. Emphysema (ICD10-J43.9). 3. Aortic Atherosclerosis (ICD10-I70.0).   Electronically signed by: Selinda Blue MD 11/23/2024 01:09 PM EST RP Workstation: [id number]    PFT     Latest Ref Rng & Units 12/25/2024   12:44 PM 06/03/2024    8:01 AM 10/07/2023    2:55 PM 10/09/2022    8:35 AM  PFT Results  FVC-Pre L 2.53  P 2.30  2.31  2.25   FVC-Predicted Pre % 109  P 100  98  94   FVC-Post L    2.46   FVC-Predicted Post %    103   Pre FEV1/FVC % % 82  P 69  80  66   Post FEV1/FCV % %    81   FEV1-Pre L 2.06  P 1.58  1.85  1.48   FEV1-Predicted Pre % 120  P  92  106  83   FEV1-Post L  1.99   DLCO uncorrected ml/min/mmHg 8.47  P 8.91  8.53  8.44   DLCO UNC% % 51  P 53  51  50   DLCO corrected ml/min/mmHg   9.07  8.44   DLCO COR %Predicted %   54  50   DLVA Predicted % 55  P 56  58  44   TLC L    5.32   TLC % Predicted %    119   RV % Predicted %    133     P Preliminary result       LAB RESULTS last 96 hours No results found.       has a past medical history of Acid reflux, Anemia (2017), COPD (chronic obstructive pulmonary disease) (HCC) (03/19/2023), Difficult intravenous access, Hepatitis C (07/2014), History of blood transfusion (2017), HIV infection (HCC) (since 1993), Hypertension, Internal hemorrhoid, and Right inguinal hernia.   reports that she quit smoking about 16 years ago. Her smoking use included cigarettes. She started smoking about 61 years ago. She has a 33.8 pack-year smoking history. She has been exposed to tobacco smoke. She has never used smokeless tobacco.  Past Surgical History:  Procedure Laterality Date   BREAST BIOPSY Right 07/26/2008   CATARACT EXTRACTION Right 2016   CATARACT EXTRACTION Left 06/2019   COLONOSCOPY WITH PROPOFOL  N/A 06/11/2017   Procedure: COLONOSCOPY WITH PROPOFOL ;  Surgeon: Shila Gustav GAILS, MD;  Location: [facility];  Service: Endoscopy;  Laterality: N/A;  PT IS KNOWN HEART STICK. WILL PROBABLY REQUIRE ULTRASOUND OR ANESTHESIA FOR IV STICK.   ESOPHAGOGASTRODUODENOSCOPY (EGD) WITH PROPOFOL  N/A 06/11/2017   Procedure: ESOPHAGOGASTRODUODENOSCOPY (EGD) WITH PROPOFOL ;  Surgeon: Shila Gustav GAILS, MD;  Location: [facility];  Service: Endoscopy;  Laterality: N/A;   INGUINAL HERNIA REPAIR Right 06/15/2024   Procedure: REPAIR, HERNIA, INGUINAL, ADULT;  Surgeon: Signe Mitzie LABOR, MD;  Location: [facility];  Service: General;  Laterality: Right;    Allergies[1]  Immunization History  Administered Date(s) Administered   Fluad Quad(high Dose 65+) 01/07/2020, 02/15/2022, 09/19/2022   Fluad  Trivalent(High Dose 65+) 09/24/2023   Hepatitis A 09/22/2010, 05/02/2011   Hepatitis B 05/10/2008, 06/09/2008, 10/11/2008   Hepatitis B, ADULT 10/14/2013, 11/13/2013, 05/17/2014   INFLUENZA, HIGH DOSE SEASONAL PF 10/06/2024   Influenza Split 10/08/2011, 09/18/2012   Influenza Whole 10/11/2008, 10/20/2009, 09/22/2010, 10/25/2014   Influenza,inj,Quad PF,6+ Mos 08/03/2013, 11/04/2017, 09/24/2018   Influenza-Unspecified 10/24/2015, 10/17/2016, 11/24/2020   Moderna Covid-19 Fall Seasonal Vaccine 27yrs & older 10/23/2023   Moderna SARS-COV2 Booster Vaccination 09/26/2022   PFIZER(Purple Top)SARS-COV-2 Vaccination 02/05/2020, 03/02/2020, 11/01/2020, 04/24/2021   Pfizer Covid-19 Vaccine Bivalent Booster 20yrs & up 04/20/2022   Pfizer(Comirnaty)Fall Seasonal Vaccine 12 years and older 11/14/2022   Pneumococcal Conjugate-13 09/24/2018   Pneumococcal Polysaccharide-23 09/09/2008, 04/01/2013, 01/07/2020   Respiratory Syncytial Virus Vaccine,Recomb Aduvanted(Arexvy) 01/22/2023   Tdap 08/24/2011   Unspecified SARS-COV-2 Vaccination 11/21/2021    Family History  Problem Relation Age of Onset   Alzheimer's disease Mother    Glaucoma Mother    Hypertension Mother    Cancer Father        ? type   Seizures Son    Stomach cancer Neg Hx    Rectal cancer Neg Hx    Colon cancer Neg Hx    Esophageal cancer Neg Hx     Current Medications[2]      Objective:   Vitals:   12/29/24 1414  BP: 122/68  Pulse: (!) 57  Temp: 98.1 F (36.7 C)  TempSrc: Oral  SpO2: 98%  Weight: 127 lb 9.6 oz (57.9 kg)  Height: 5' (1.524 m)    Estimated body mass index is 24.92 kg/m as calculated from the following:   Height as of this encounter: 5' (1.524 m).   Weight as of this encounter: 127 lb 9.6 oz (57.9 kg).  @WEIGHTCHANGE @  [employer]   12/29/24 1414  Weight: 127 lb 9.6 oz (57.9 kg)     Physical Exam   General: No distress. Looks well O2 at rest: no Cane present: no Sitting in wheel chair:  no Frail: no Obese: no Neuro: Alert and Oriented x 3. GCS 15. Speech normal Psych: Pleasant Resp:  Barrel Chest - no.  Wheeze - no, Crackles - no, No overt respiratory distress CVS: Normal heart sounds. Murmurs - no Ext: Stigmata of Connective Tissue Disease - no HEENT: Normal upper airway. PEERL +. No post nasal drip        Assessment/     Assessment & Plan Interstitial lung abnormality (ILA)  Pulmonary emphysema (HCC)    PLAN Patient Instructions   # Emphysema - stable and well on spiriva   PLAN - Continue  low-dose Spiriva   1.25 mcg @ 2 puff  each tme but only once daily  #Interstial lung inflammation - clinically stable, Given  > 250  CD4 doubt HIV rlated lung inflammation. PFT better in Jan 2026   Plan - - control GERD - continue immuran for eye issue - continue albuterol  as needed - - do spiro dllco in 8 mnths    Followup -8 month routine 15 min followup  - walk test, symptom score at followup  - but after PFT      FOLLOWUP    Return in about 8 months (around 08/29/2025) for 15 min visit, after Spiro and DLCO, with Dr Geronimo, Face to Face Visit.    SIGNATURE    Dr. Dorethia Geronimo, M.D., F.C.C.P,  Pulmonary and Critical Care Medicine Staff Physician, [facility] Director - Interstitial Lung Disease  Program  Pulmonary Fibrosis [facility] at [facility] [place], [place], [address]  Pager: [phone], If no answer or between  15:00h - 7:00h: call [phone] Telephone: [phone]  3:05 PM 12/29/2024     [1]  Allergies Allergen Reactions   Iodinated Contrast Media Hives and Itching   Lisinopril  Itching and Cough   Other Hives and Itching     Seafood crustaceans  itchy throat  [2]  Current Outpatient Medications:    acetaminophen  (TYLENOL ) 325 MG tablet, Take 325 mg by mouth daily as needed for moderate pain or headache., Disp: , Rfl:    amLODipine  (NORVASC ) 10 MG tablet, Take 10 mg by  mouth daily., Disp: , Rfl:    Ascorbic Acid (VITAMIN C PO), Take 1 tablet by mouth daily., Disp: , Rfl:    aspirin 81 MG chewable tablet, Chew 81 mg by mouth daily., Disp: , Rfl:    azaTHIOprine  (IMURAN ) 50 MG tablet, Take 50 mg by mouth daily. (Patient taking differently: Take 50 mg by mouth daily. Patient stated she takes 25 mg), Disp: , Rfl:    azelastine  (ASTELIN ) 0.1 % nasal spray, Place 2 sprays into both nostrils 2 (two) times daily. Use in each nostril as directed (Patient taking differently: Place 2 sprays into both nostrils 2 (two) times daily. Use in each nostril as directed, as needed), Disp: 30 mL, Rfl: 0   BIOTIN PO, Take 500 mcg by  mouth daily., Disp: , Rfl:    calcium  carbonate (OSCAL) 1500 (600 Ca) MG TABS tablet, Take 1,500 mg by mouth daily with breakfast., Disp: , Rfl:    Cyanocobalamin  (VITAMIN B12 PO), Take 1 tablet by mouth daily., Disp: , Rfl:    docusate sodium  (COLACE) 100 MG capsule, Take 100 mg by mouth as needed for mild constipation., Disp: , Rfl:    famotidine (PEPCID) 40 MG tablet, Take 40 mg by mouth 2 (two) times daily., Disp: , Rfl:    ferrous sulfate  325 (65 FE) MG tablet, Take 1 tablet (325 mg total) by mouth daily., Disp: 30 tablet, Rfl: 0   fluticasone  (FLONASE ) 50 MCG/ACT nasal spray, Place 2 sprays into both nostrils daily as needed for allergies or rhinitis., Disp: , Rfl:    folic acid  (FOLVITE ) 1 MG tablet, Take 1 mg by mouth daily., Disp: , Rfl:    hydrocortisone  (ANUSOL -HC) 25 MG suppository, Place 25 mg rectally daily as needed for hemorrhoids., Disp: , Rfl:    Misc Natural Products (NEURIVA PO), Take 1 capsule by mouth daily., Disp: , Rfl:    pantoprazole  (PROTONIX ) 40 MG tablet, TAKE 1 TABLET BY MOUTH TWICE  DAILY, Disp: 200 tablet, Rfl: 2   polyethylene glycol (MIRALAX  / GLYCOLAX ) packet, Take 17 g by mouth every other day. (Patient taking differently: Take 17 g by mouth every other day. As needed), Disp: , Rfl:    rosuvastatin  (CRESTOR ) 20 MG tablet,  Take 20 mg by mouth in the morning., Disp: , Rfl:    SPIRIVA  RESPIMAT 1.25 MCG/ACT AERS, USE 2 INHALATIONS BY MOUTH DAILY, Disp: 12 g, Rfl: 3   valACYclovir (VALTREX) 1000 MG tablet, Take 1,000 mg by mouth daily. , Disp: , Rfl:    Zinc 50 MG TABS, Take 50 mg by mouth daily., Disp: , Rfl:    OXYGEN, Inhale 1.5 L into the lungs at bedtime. (Patient not taking: Reported on 12/29/2024), Disp: , Rfl:   "

## 2025-07-06 ENCOUNTER — Encounter: Admitting: Infectious Diseases
